# Patient Record
Sex: Male | Born: 1970 | Race: White | Hispanic: No | Marital: Married | Smoking: Never smoker
Health system: Southern US, Academic
[De-identification: ages and names within clinical notes are randomized; demographics above are authoritative.]

## PROBLEM LIST (undated history)

## (undated) DIAGNOSIS — M7540 Impingement syndrome of unspecified shoulder: Secondary | ICD-10-CM

## (undated) DIAGNOSIS — G4730 Sleep apnea, unspecified: Secondary | ICD-10-CM

## (undated) DIAGNOSIS — R972 Elevated prostate specific antigen [PSA]: Secondary | ICD-10-CM

## (undated) DIAGNOSIS — D696 Thrombocytopenia, unspecified: Secondary | ICD-10-CM

## (undated) DIAGNOSIS — K529 Noninfective gastroenteritis and colitis, unspecified: Secondary | ICD-10-CM

## (undated) DIAGNOSIS — C61 Malignant neoplasm of prostate: Secondary | ICD-10-CM

## (undated) DIAGNOSIS — Z86010 Personal history of colonic polyps: Secondary | ICD-10-CM

## (undated) DIAGNOSIS — E669 Obesity, unspecified: Secondary | ICD-10-CM

## (undated) DIAGNOSIS — M5416 Radiculopathy, lumbar region: Secondary | ICD-10-CM

## (undated) DIAGNOSIS — J310 Chronic rhinitis: Secondary | ICD-10-CM

## (undated) DIAGNOSIS — K5790 Diverticulosis of intestine, part unspecified, without perforation or abscess without bleeding: Secondary | ICD-10-CM

## (undated) DIAGNOSIS — G4733 Obstructive sleep apnea (adult) (pediatric): Secondary | ICD-10-CM

## (undated) DIAGNOSIS — Z9989 Dependence on other enabling machines and devices: Secondary | ICD-10-CM

## (undated) DIAGNOSIS — H409 Unspecified glaucoma: Secondary | ICD-10-CM

## (undated) DIAGNOSIS — E66811 Obesity, class 1: Secondary | ICD-10-CM

## (undated) DIAGNOSIS — E7800 Pure hypercholesterolemia, unspecified: Secondary | ICD-10-CM

## (undated) DIAGNOSIS — K219 Gastro-esophageal reflux disease without esophagitis: Secondary | ICD-10-CM

## (undated) DIAGNOSIS — K580 Irritable bowel syndrome with diarrhea: Secondary | ICD-10-CM

## (undated) DIAGNOSIS — T7840XA Allergy, unspecified, initial encounter: Secondary | ICD-10-CM

## (undated) DIAGNOSIS — M4317 Spondylolisthesis, lumbosacral region: Secondary | ICD-10-CM

## (undated) DIAGNOSIS — K5792 Diverticulitis of intestine, part unspecified, without perforation or abscess without bleeding: Secondary | ICD-10-CM

## (undated) DIAGNOSIS — J349 Unspecified disorder of nose and nasal sinuses: Secondary | ICD-10-CM

## (undated) DIAGNOSIS — Z9109 Other allergy status, other than to drugs and biological substances: Secondary | ICD-10-CM

## (undated) HISTORY — DX: Pure hypercholesterolemia, unspecified: E78.00

## (undated) HISTORY — DX: Elevated prostate specific antigen (PSA): R97.20

## (undated) HISTORY — DX: Malignant neoplasm of prostate: C61

## (undated) HISTORY — DX: Allergy, unspecified, initial encounter: T78.40XA

## (undated) HISTORY — DX: Spondylolisthesis, lumbosacral region: M43.17

## (undated) HISTORY — DX: Personal history of colonic polyps: Z86.010

## (undated) HISTORY — PX: BICEPS TENDON REPAIR: SHX566

## (undated) HISTORY — DX: Irritable bowel syndrome with diarrhea: K58.0

## (undated) HISTORY — PX: SPINE SURGERY: SHX786

## (undated) HISTORY — PX: KNEE ARTHROSCOPY: SUR90

## (undated) HISTORY — PX: ESOPHAGOGASTRODUODENOSCOPY: SHX1529

## (undated) HISTORY — PX: EYE SURGERY: SHX253

## (undated) HISTORY — DX: Radiculopathy, lumbar region: M54.16

## (undated) HISTORY — DX: Obesity, class 1: E66.811

## (undated) HISTORY — DX: Unspecified glaucoma: H40.9

## (undated) HISTORY — PX: SHOULDER ARTHROSCOPY: SHX128

## (undated) HISTORY — DX: Dependence on other enabling machines and devices: Z99.89

## (undated) HISTORY — DX: Noninfective gastroenteritis and colitis, unspecified: K52.9

## (undated) HISTORY — DX: Chronic rhinitis: J31.0

## (undated) HISTORY — DX: Impingement syndrome of unspecified shoulder: M75.40

## (undated) HISTORY — PX: COLONOSCOPY W/ POLYPECTOMY: SHX1380

## (undated) HISTORY — DX: Thrombocytopenia, unspecified: D69.6

## (undated) HISTORY — DX: Obesity, unspecified: E66.9

## (undated) HISTORY — DX: Obstructive sleep apnea (adult) (pediatric): G47.33

## (undated) HISTORY — DX: Diverticulosis of intestine, part unspecified, without perforation or abscess without bleeding: K57.90

## (undated) HISTORY — DX: Sleep apnea, unspecified: G47.30

## (undated) HISTORY — PX: WISDOM TOOTH EXTRACTION: SHX21

## (undated) HISTORY — PX: COLONOSCOPY: WVUENDOPRO10

## (undated) HISTORY — PX: HX SHOULDER ARTHROSCOPY: SHX128

## (undated) HISTORY — PX: HX KNEE ARTHROSCOPY: SHX127

## (undated) HISTORY — DX: Diverticulitis of intestine, part unspecified, without perforation or abscess without bleeding: K57.92

## (undated) HISTORY — DX: Unspecified disorder of nose and nasal sinuses: J34.9

## (undated) HISTORY — DX: Other allergy status, other than to drugs and biological substances: Z91.09

## (undated) HISTORY — PX: HX SEPTOPLASTY: SHX37

## (undated) HISTORY — PX: HX WRIST FRACTURE TX: SHX121

---

## 2007-02-26 DIAGNOSIS — K5790 Diverticulosis of intestine, part unspecified, without perforation or abscess without bleeding: Secondary | ICD-10-CM

## 2007-02-26 HISTORY — DX: Diverticulosis of intestine, part unspecified, without perforation or abscess without bleeding: K57.90

## 2008-02-26 HISTORY — PX: WRIST FRACTURE SURGERY: SHX121

## 2010-11-26 ENCOUNTER — Encounter: Payer: Self-pay | Admitting: Family Medicine

## 2010-11-26 ENCOUNTER — Ambulatory Visit (INDEPENDENT_AMBULATORY_CARE_PROVIDER_SITE_OTHER): Payer: BC Managed Care – PPO | Admitting: Family Medicine

## 2010-11-26 VITALS — BP 96/70 | HR 73 | Temp 98.0°F | Wt 228.0 lb

## 2010-11-26 DIAGNOSIS — J209 Acute bronchitis, unspecified: Secondary | ICD-10-CM

## 2010-11-26 DIAGNOSIS — Z23 Encounter for immunization: Secondary | ICD-10-CM

## 2010-11-26 MED ORDER — FEXOFENADINE HCL 180 MG PO TABS: 180.0000 mg | ORAL_TABLET | Freq: Every day | ORAL | Status: DC

## 2010-11-26 MED ORDER — HYDROCODONE-HOMATROPINE 5-1.5 MG/5ML PO SYRP: ORAL_SOLUTION | ORAL | Status: AC

## 2010-11-26 NOTE — Assessment & Plan Note (Signed)
Viral etiology suspected.  No sign of RAD. I think he is past the worst of this. Discussed symptomatic care with allegra 180mg  qd, add hycodan 1-2 tsp q6h prn, #153ml, no RF. Discussed expected course, reviewed signs/symptoms to call or return for. Flu vaccine IM given today.

## 2010-11-26 NOTE — Progress Notes (Signed)
Office Note 11/26/2010  CC:  Chief Complaint  Patient presents with  . URI    began 11/21/10-felt achey, chills.  Has ST, HA, cough    HPI:  Roy [name] is a 40 y.o. White male who is here to establish care and discuss cough. Patient's most recent primary MD: none Old records were not reviewed prior to or during today's visit.  Onset 5d/a:fever/chills, cough, described as hacky/dry, constant tickle in back of throat, feels mild PND.  No nasal congestion, sneezing, or facial pain.  Body aches initially.  No fever or body aches in the last 2-3d.  Some ST only when coughing.  Denies chest tightness, wheezing, or SOB.  Past Medical History  Diagnosis Date  . Diverticular disease 2009    Colonoscopy confirmed approx 2009 (diverticulitis x 2 episodes).  Hyperlipidemia per pt report: has done TLC only--no meds.  Past Surgical History  Procedure Date  . Wrist fracture surgery 2010    Hockey injury, plate inserted  . Knee arthroscopy     Bilat; 2003, 2004, 2010  . Shoulder arthroscopy     Bilat; 1993, 2000, 2007    Family History  Problem Relation Age of Onset  . Cancer Maternal Grandmother     pancreatic cancer  . Cancer Paternal Grandmother     colon  . Cancer Paternal Grandfather     lung (smoker)    History   Social History  . Marital Status: Married    Spouse Name: N/A    Number of Children: N/A  . Years of Education: N/A   Occupational History  . Not on file.   Social History Main Topics  . Smoking status: Never Smoker   . Smokeless tobacco: Never Used  . Alcohol Use: Not on file  . Drug Use: Not on file  . Sexually Active: Not on file   Other Topics Concern  . 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   CURRENT MEDS: advil q6h prn, delsym prn. The meds listed below were added today. Outpatient Encounter Prescriptions as of 11/26/2010  Medication Sig Dispense Refill  . fexofenadine (ALLEGRA) 180 MG tablet Take 1 tablet (180 mg total) by mouth daily.  15 tablet  1  . HYDROcodone-homatropine (HYCODAN) 5-1.5 MG/5ML syrup 1-2 tsp po q6h prn cough  120 mL  0  . ibuprofen (ADVIL,MOTRIN) 200 MG tablet Take 200 mg by mouth every 6 (six) hours as needed.        . Multiple Vitamin (MULTIVITAMIN) tablet Take 1 tablet by mouth daily.          Allergies  Allergen Reactions  . Dayquil Multi-Symptom Cold-Flu (Eql Daytime Cold-Flu) Hives    ROS Review of Systems  Constitutional: Negative for fever, chills, appetite change and fatigue.  HENT: Positive for postnasal drip. Negative for ear pain, congestion, sore throat (only with cough), neck stiffness and dental problem.   Eyes: Negative for discharge, redness and visual disturbance.  Respiratory: Positive for cough. Negative for chest tightness, shortness of breath and wheezing.   Cardiovascular: Negative for chest pain, palpitations and leg swelling.  Gastrointestinal: Negative for nausea, vomiting, abdominal pain, diarrhea and blood in stool.  Genitourinary: Negative for dysuria, urgency, frequency, hematuria, flank pain and difficulty  urinating.  Musculoskeletal: Negative for myalgias, back pain, joint swelling and arthralgias.  Skin: Negative for pallor and rash.  Neurological: Negative for dizziness, speech difficulty, weakness and headaches.  Hematological: Negative for adenopathy. Does not bruise/bleed easily.  Psychiatric/Behavioral: Negative for confusion and sleep disturbance. The patient is not nervous/anxious.      PE; Blood pressure 96/70, pulse 73, temperature 98 F (36.7 C), temperature source Oral, weight 228 lb (103.42 kg), SpO2 95.00%. Gen: Alert, well  appearing.  Patient is oriented to person, place, time, and situation. HEENT: Scalp without lesions or hair loss.  Ears: EACs clear, normal epithelium.  TMs with good light reflex and landmarks bilaterally.  Eyes: no injection, icteris, swelling, or exudate.  EOMI, PERRLA. Nose: no drainage or turbinate edema/swelling.  No injection or focal lesion.  Mouth: lips without lesion/swelling.  Oral mucosa pink and moist.  Dentition intact and without obvious caries or gingival swelling.  Oropharynx without erythema, exudate, or swelling.  Neck: supple, ROM full.  Carotids 2+ bilat, without bruit.  No lymphadenopathy, thyromegaly, or mass. Chest: symmetric expansion, nonlabored respirations.  Clear and equal breath sounds in all lung fields.   CV: RRR, no m/r/g.  Peripheral pulses 2+ and symmetric. ABD: soft, ND/NT. EXT: no clubbing, cyanosis, or edema.   Pertinent labs:  None today  ASSESSMENT AND PLAN:   New patient:  Acute bronchitis Viral etiology suspected.  No sign of RAD. I think he is past the worst of this. Discussed symptomatic care with allegra 180mg  qd, add hycodan 1-2 tsp q6h prn, #134ml, no RF. Discussed expected course, reviewed signs/symptoms to call or return for. Flu vaccine IM given today.     Return if symptoms worsen or fail to improve.

## 2010-12-13 ENCOUNTER — Encounter: Payer: Self-pay | Admitting: Family Medicine

## 2010-12-13 ENCOUNTER — Ambulatory Visit (INDEPENDENT_AMBULATORY_CARE_PROVIDER_SITE_OTHER): Payer: Self-pay | Admitting: Family Medicine

## 2010-12-13 VITALS — BP 118/78 | HR 75 | Temp 98.1°F | Wt 225.0 lb

## 2010-12-13 DIAGNOSIS — J209 Acute bronchitis, unspecified: Secondary | ICD-10-CM

## 2010-12-13 DIAGNOSIS — T148XXA Other injury of unspecified body region, initial encounter: Secondary | ICD-10-CM | POA: Insufficient documentation

## 2010-12-13 MED ORDER — PREDNISONE 20 MG PO TABS: ORAL_TABLET | ORAL | Status: DC

## 2010-12-13 MED ORDER — CYCLOBENZAPRINE HCL 5 MG PO TABS: 5.0000 mg | ORAL_TABLET | Freq: Three times a day (TID) | ORAL | Status: AC | PRN

## 2010-12-13 MED ORDER — AZITHROMYCIN 250 MG PO TABS: ORAL_TABLET | ORAL | Status: AC
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
## 2010-12-13 NOTE — Assessment & Plan Note (Signed)
Prolonged symptoms. Will start azithromycin x 5d and prednisone 40mg  qd x 5d. Recommended mucinex DM prn for symptoms control.
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
## 2010-12-13 NOTE — Patient Instructions (Signed)
mucinex dm or robitussin dm as needed for cough

## 2011-01-30 ENCOUNTER — Ambulatory Visit (INDEPENDENT_AMBULATORY_CARE_PROVIDER_SITE_OTHER): Payer: BC Managed Care – PPO | Admitting: Family Medicine

## 2011-01-30 ENCOUNTER — Encounter: Payer: Self-pay | Admitting: Family Medicine

## 2011-01-30 ENCOUNTER — Other Ambulatory Visit: Payer: Self-pay | Admitting: *Deleted

## 2011-01-30 VITALS — BP 118/81 | HR 76 | Temp 97.5°F | Wt 234.0 lb

## 2011-01-30 DIAGNOSIS — S0100XA Unspecified open wound of scalp, initial encounter: Secondary | ICD-10-CM

## 2011-01-30 DIAGNOSIS — S0101XA Laceration without foreign body of scalp, initial encounter: Secondary | ICD-10-CM

## 2011-01-30 DIAGNOSIS — Z23 Encounter for immunization: Secondary | ICD-10-CM
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
## 2011-01-30 NOTE — Progress Notes (Signed)
Addended by: Francee Piccolo C on: 01/30/2011 11:28 AM   Modules accepted: Orders

## 2011-01-30 NOTE — Patient Instructions (Signed)
Gently wash hair/clean scalp lac once daily with your normal shampoo. Dab dry with a towel.

## 2011-02-04 ENCOUNTER — Encounter: Payer: Self-pay | Admitting: Family Medicine

## 2011-02-04 ENCOUNTER — Ambulatory Visit (INDEPENDENT_AMBULATORY_CARE_PROVIDER_SITE_OTHER): Payer: BC Managed Care – PPO | Admitting: Family Medicine

## 2011-02-04 VITALS — BP 114/79 | HR 76 | Temp 97.8°F | Ht 73.0 in | Wt 234.1 lb

## 2011-02-04 DIAGNOSIS — S0101XA Laceration without foreign body of scalp, initial encounter: Secondary | ICD-10-CM

## 2011-02-04 DIAGNOSIS — S0100XA Unspecified open wound of scalp, initial encounter: Secondary | ICD-10-CM

## 2011-02-04 NOTE — Progress Notes (Signed)
OFFICE NOTE  02/04/2011  CC:  Chief Complaint  Patient presents with  . stich removal     HPI: Patient is a 40 y.o. Caucasian male who is here for suture removal from scalp that I put in 5d/a. Feeling fine.  NO drainage, no pain, no fevers.  Pertinent PMH:  NONE Pertinent Meds: NONE PE: Blood pressure 114/79, pulse 76, temperature 97.8 F (36.6 C), temperature source Oral, height 6\' 1"  (1.854 m), weight 234 lb 1.9 oz (106.196 kg), SpO2 96.00%. Gen: Alert, well appearing.  Patient is oriented to person, place, time, and situation. Scalp: Right parietal scalp area with 1 cm lac with wound edges well approximated.  No erythema, drainage, or tenderness. Suture in place.  IMPRESSION AND PLAN: Small calp lac--looks good.  Removed suture today w/out problem. Wound care instructions reviewed.  FOLLOW UP: prn

## 2011-12-14 ENCOUNTER — Other Ambulatory Visit: Payer: Self-pay | Admitting: Family Medicine

## 2011-12-14 MED ORDER — METRONIDAZOLE 500 MG PO TABS: ORAL_TABLET | ORAL | Status: DC

## 2011-12-14 MED ORDER — CIPROFLOXACIN HCL 750 MG PO TABS: ORAL_TABLET | ORAL | Status: DC

## 2011-12-14 NOTE — Progress Notes (Signed)
Saw pt in community.  He said he wasn't feeling well, has had lower abd aching diffusely last couple of days, getting a little worse, says it feels like past episodes of diverticulitis he has had.  I advised him to call if he felt like it wasn't getting better.  He called me, said it wasn't improving, so I eRx'd metronidazole and flagyl for presumed diverticulitis.  I did advise him to call me back or come in for office visit if his abd pain is worsening or he has new/more worrisome symptoms.  Will call pt tomorrow and see how he's doing.

## 2012-02-26 DIAGNOSIS — D696 Thrombocytopenia, unspecified: Secondary | ICD-10-CM

## 2012-02-26 HISTORY — DX: Thrombocytopenia, unspecified: D69.6

## 2012-07-28 ENCOUNTER — Other Ambulatory Visit (INDEPENDENT_AMBULATORY_CARE_PROVIDER_SITE_OTHER): Payer: BC Managed Care – PPO

## 2012-07-28 DIAGNOSIS — Z0000 Encounter for general adult medical examination without abnormal findings: Secondary | ICD-10-CM

## 2012-07-28 DIAGNOSIS — K589 Irritable bowel syndrome without diarrhea: Secondary | ICD-10-CM

## 2012-07-28 DIAGNOSIS — R1013 Epigastric pain: Secondary | ICD-10-CM

## 2012-07-28 LAB — CBC WITH DIFFERENTIAL/PLATELET
Basophils Absolute: 0 10*3/uL (ref 0.0–0.1)
Eosinophils Absolute: 0.3 10*3/uL (ref 0.0–0.7)
HCT: 47.6 % (ref 39.0–52.0)
Hemoglobin: 15.8 g/dL (ref 13.0–17.0)
Lymphocytes Relative: 28.7 % (ref 12.0–46.0)
Lymphs Abs: 1.5 10*3/uL (ref 0.7–4.0)
MCHC: 33.2 g/dL (ref 30.0–36.0)
MCV: 97.7 fl (ref 78.0–100.0)
Monocytes Absolute: 0.5 10*3/uL (ref 0.1–1.0)
Neutro Abs: 2.9 10*3/uL (ref 1.4–7.7)
RDW: 13.2 % (ref 11.5–14.6)

## 2012-07-28 LAB — COMPREHENSIVE METABOLIC PANEL
ALT: 15 U/L (ref 0–53)
AST: 20 U/L (ref 0–37)
Alkaline Phosphatase: 72 U/L (ref 39–117)
Creatinine, Ser: 1.1 mg/dL (ref 0.4–1.5)
Total Bilirubin: 1.1 mg/dL (ref 0.3–1.2)

## 2012-07-28 LAB — LIPID PANEL
Cholesterol: 216 mg/dL — ABNORMAL HIGH (ref 0–200)
Total CHOL/HDL Ratio: 6
Triglycerides: 169 mg/dL — ABNORMAL HIGH (ref 0.0–149.0)

## 2012-07-28 NOTE — Addendum Note (Signed)
Addended by: Baldemar Lenis R on: 07/28/2012 11:00 AM   Modules accepted: Orders

## 2012-07-28 NOTE — Progress Notes (Signed)
Labs only

## 2012-07-30 ENCOUNTER — Encounter: Payer: Self-pay | Admitting: Family Medicine

## 2012-07-30 ENCOUNTER — Ambulatory Visit: Payer: BC Managed Care – PPO

## 2012-07-30 ENCOUNTER — Ambulatory Visit (INDEPENDENT_AMBULATORY_CARE_PROVIDER_SITE_OTHER): Payer: BC Managed Care – PPO | Admitting: Family Medicine

## 2012-07-30 VITALS — BP 90/60 | HR 73 | Temp 97.6°F | Ht 73.0 in | Wt 239.8 lb

## 2012-07-30 DIAGNOSIS — K589 Irritable bowel syndrome without diarrhea: Secondary | ICD-10-CM

## 2012-07-30 DIAGNOSIS — D239 Other benign neoplasm of skin, unspecified: Secondary | ICD-10-CM

## 2012-07-30 DIAGNOSIS — Z0000 Encounter for general adult medical examination without abnormal findings: Secondary | ICD-10-CM

## 2012-07-30 DIAGNOSIS — R142 Eructation: Secondary | ICD-10-CM

## 2012-07-30 DIAGNOSIS — R143 Flatulence: Secondary | ICD-10-CM

## 2012-07-30 DIAGNOSIS — R1013 Epigastric pain: Secondary | ICD-10-CM

## 2012-07-30 NOTE — Progress Notes (Signed)
Office Note 08/09/2012  CC:  Chief Complaint  Patient presents with  . Annual Exam    HPI:  Roy [name] is a 42 y.o. White male who is here for CPE.    Not exercising but plans on starting soon.   Reports about 6 mo hx of excessive burping.  He does also have some classic heartburn but much less often than the gas/burping.  Sometimes occurs in postprandial timeframe but also sometimes seems to occur unrelated to eating anything. No NSAIDs with any regularity.  Tums have not helped, prilosec on occasion has not helped.  He has not tried gas-ex yet.  Denies constipation, melena, or hematochezia.  He does occasionally have postprandial fecal urgency and on some days has excessive, slightly loose BMs.   No abdominal cramping.  Appetite is good.   Past Medical History  Diagnosis Date  . Diverticular disease 2009    Colonoscopy confirmed approx 2009 (diverticulitis x 2 episodes).    Past Surgical History  Procedure Laterality Date  . Wrist fracture surgery  2010    Hockey injury, plate inserted  . Knee arthroscopy      Bilat; 2003, 2004, 2010  . Shoulder arthroscopy      Bilat; 1993, 2000, 2007    Family History  Problem Relation Age of Onset  . Cancer Maternal Grandmother     pancreatic cancer  . Cancer Paternal Grandmother     colon  . Cancer Paternal Grandfather     lung (smoker)    History   Social History  . Marital Status: Married    Spouse Name: N/A    Number of Children: N/A  . Years of Education: N/A   Occupational History  . Not on file.   Social History Main Topics  . Smoking status: Never Smoker   . Smokeless tobacco: Never Used  . Alcohol Use: Not on file  . Drug Use: Not on file  . Sexually Active: Not on file   Other Topics Concern  . Not on file   Social History Narrative   Married, 3 children from ages 20yrs to 44 yrs.   Occupation: Doctor, hospital for [employer].   Grad from [employer] in [place], [place].    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   Exercises irregularly, no particular dietary focus.   No T/A/Ds.   MEDS: none  Allergies  Allergen Reactions  . Dayquil Multi-Symptom Cold-Flu (Pseudoephedrine-Apap-Dm) Hives    ROS Review of Systems  Constitutional: Negative for fever, chills, appetite change and fatigue.  HENT: Negative for ear pain, congestion, sore throat, neck stiffness and dental problem.   Eyes: Negative for discharge, redness and visual disturbance.  Respiratory: Negative for cough, chest tightness, shortness of breath and wheezing.   Cardiovascular: Negative for chest pain, palpitations and leg swelling.  Gastrointestinal: Negative for nausea, vomiting and blood in stool.       See HPI  Genitourinary: Negative for dysuria, urgency, frequency, hematuria, flank pain and difficulty urinating.  Musculoskeletal: Negative for myalgias, back pain, joint swelling and arthralgias.  Skin: Negative for pallor and rash.  Neurological: Negative for dizziness, speech difficulty, weakness and headaches.  Hematological: Negative for adenopathy. Does not bruise/bleed easily.  Psychiatric/Behavioral: Negative for confusion and sleep disturbance. The patient is not nervous/anxious.     PE; Blood pressure 90/60, pulse 73, temperature 97.6 F (36.4 C), temperature source Oral, height 6\' 1"  (1.854 m), weight 239 lb  12 oz (108.75 kg), SpO2 97.00%. Gen: Alert, well appearing.  Patient is oriented to person, place, time, and situation. AFFECT: pleasant, lucid thought and speech. ENT: Ears: EACs clear, normal epithelium.  TMs with good light reflex and landmarks bilaterally.  Eyes: no injection, icteris, swelling, or exudate.  EOMI, PERRLA. Nose: no drainage or turbinate edema/swelling.  No injection or focal lesion.  Mouth: lips without lesion/swelling.  Oral mucosa pink and moist.  Dentition intact and without obvious  caries or gingival swelling.  Oropharynx without erythema, exudate, or swelling.  Neck: supple/nontender.  No LAD, mass, or TM.  Carotid pulses 2+ bilaterally, without bruits. CV: RRR, no m/r/g.   LUNGS: CTA bilat, nonlabored resps, good aeration in all lung fields. ABD: soft, NT, ND, BS normal.  No hepatospenomegaly or mass.  No bruits. EXT: no clubbing, cyanosis, or edema.  Musculoskeletal: no joint swelling, erythema, warmth, or tenderness.  ROM of all joints intact. Skin - no sores or suspicious lesions or rashes or color changes.  He has a 1 cm brownish oval macule with slightly firm surface on left thigh--pt says it has been there as long as he can remember and hasn't changed.   Pertinent labs:  Lab Results  Component Value Date   TSH 1.87 07/28/2012   Lab Results  Component Value Date   WBC 5.3 07/28/2012   HGB 15.8 07/28/2012   HCT 47.6 07/28/2012   MCV 97.7 07/28/2012   PLT 134.0* 07/28/2012   Lab Results  Component Value Date   CREATININE 1.1 07/28/2012   BUN 14 07/28/2012   NA 139 07/28/2012   K 4.1 07/28/2012   CL 104 07/28/2012   CO2 31 07/28/2012   Lab Results  Component Value Date   ALT 15 07/28/2012   AST 20 07/28/2012   ALKPHOS 72 07/28/2012   BILITOT 1.1 07/28/2012   Lab Results  Component Value Date   CHOL 216* 07/28/2012   Lab Results  Component Value Date   HDL 33.90* 07/28/2012   No results found for this basename: LDLCALC   Lab Results  Component Value Date   TRIG 169.0* 07/28/2012   Lab Results  Component Value Date   CHOLHDL 6 07/28/2012   No results found for this basename: PSA       ASSESSMENT AND PLAN:   Health maintenance examination Reviewed age and gender appropriate health maintenance issues (prudent diet, regular exercise, health risks of tobacco and excessive alcohol, use of seatbelts, fire alarms in home, use of sunscreen).  Also reviewed age and gender appropriate health screening as well as vaccine recommendations. Labs reviewed with pt in detail  today--platelets just a touch low.  Will recheck CBC in 1 mo to make sure there is no trend downward. No immunizations needed.  Excessive gas Dyspepsia vs atypical GERD vs just dietary effect. Suspect mild IBS as well.    Decided to try gas-ex for a few days.  If not convincingly better then d/c gas-ex and do 3 wk trial of dexilant 60mg  qd (samples given today). Celiac panel and H pylori IgG today.  Signs/symptoms to call or return for were reviewed and pt expressed understanding. GI referral in future if he gets more worrisome sx's or we simply can't find an answer or solution for his problem.  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 Reassured.    FOLLOW UP:  Return in about 1 year (around 07/30/2013) for CPE.  BUT: return for lab visit for recheck CBC in 1 month.

## 2012-07-30 NOTE — Addendum Note (Signed)
Addended by: Baldemar Lenis R on: 07/30/2012 10:09 AM   Modules accepted: Orders

## 2012-07-30 NOTE — Patient Instructions (Signed)
Health Maintenance, Males A healthy lifestyle and preventative care can promote health and wellness.  Maintain regular health, dental, and eye exams.  Eat a healthy diet. Foods like vegetables, fruits, whole grains, low-fat dairy products, and lean protein foods contain the nutrients you need without too many calories. Decrease your intake of foods high in solid fats, added sugars, and salt. Get information about a proper diet from your caregiver, if necessary.  Regular physical exercise is one of the most important things you can do for your health. Most adults should get at least 150 minutes of moderate-intensity exercise (any activity that increases your heart rate and causes you to sweat) each week. In addition, most adults need muscle-strengthening exercises on 2 or more days a week.   Maintain a healthy weight. The body mass index (BMI) is a screening tool to identify possible weight problems. It provides an estimate of body fat based on height and weight. Your caregiver can help determine your BMI, and can help you achieve or maintain a healthy weight. For adults 20 years and older:  A BMI below 18.5 is considered underweight.  A BMI of 18.5 to 24.9 is normal.  A BMI of 25 to 29.9 is considered overweight.  A BMI of 30 and above is considered obese.  Maintain normal blood lipids and cholesterol by exercising and minimizing your intake of saturated fat. Eat a balanced diet with plenty of fruits and vegetables. Blood tests for lipids and cholesterol should begin at age 20 and be repeated every 5 years. If your lipid or cholesterol levels are high, you are over 50, or you are a high risk for heart disease, you may need your cholesterol levels checked more frequently.Ongoing high lipid and cholesterol levels should be treated with medicines, if diet and exercise are not effective.  If you smoke, find out from your caregiver how to quit. If you do not use tobacco, do not start.  If you  choose to drink alcohol, do not exceed 2 drinks per day. One drink is considered to be 12 ounces (355 mL) of beer, 5 ounces (148 mL) of wine, or 1.5 ounces (44 mL) of liquor.  Avoid use of street drugs. Do not share needles with anyone. Ask for help if you need support or instructions about stopping the use of drugs.  High blood pressure causes heart disease and increases the risk of stroke. Blood pressure should be checked at least every 1 to 2 years. Ongoing high blood pressure should be treated with medicines if weight loss and exercise are not effective.  If you are 45 to 42 years old, ask your caregiver if you should take aspirin to prevent heart disease.  Diabetes screening involves taking a blood sample to check your fasting blood sugar level. This should be done once every 3 years, after age 45, if you are within normal weight and without risk factors for diabetes. Testing should be considered at a younger age or be carried out more frequently if you are overweight and have at least 1 risk factor for diabetes.  Colorectal cancer can be detected and often prevented. Most routine colorectal cancer screening begins at the age of 50 and continues through age 75. However, your caregiver may recommend screening at an earlier age if you have risk factors for colon cancer. On a yearly basis, your caregiver may provide home test kits to check for hidden blood in the stool. Use of a small camera at the end of a tube,   to directly examine the colon (sigmoidoscopy or colonoscopy), can detect the earliest forms of colorectal cancer. Talk to your caregiver about this at age 50, when routine screening begins. Direct examination of the colon should be repeated every 5 to 10 years through age 75, unless early forms of pre-cancerous polyps or small growths are found.  Hepatitis C blood testing is recommended for all people born from 1945 through 1965 and any individual with known risks for hepatitis C.  Healthy  men should no longer receive prostate-specific antigen (PSA) blood tests as part of routine cancer screening. Consult with your caregiver about prostate cancer screening.  Testicular cancer screening is not recommended for adolescents or adult males who have no symptoms. Screening includes self-exam, caregiver exam, and other screening tests. Consult with your caregiver about any symptoms you have or any concerns you have about testicular cancer.  Practice safe sex. Use condoms and avoid high-risk sexual practices to reduce the spread of sexually transmitted infections (STIs).  Use sunscreen with a sun protection factor (SPF) of 30 or greater. Apply sunscreen liberally and repeatedly throughout the day. You should seek shade when your shadow is shorter than you. Protect yourself by wearing long sleeves, pants, a wide-brimmed hat, and sunglasses year round, whenever you are outdoors.  Notify your caregiver of new moles or changes in moles, especially if there is a change in shape or color. Also notify your caregiver if a mole is larger than the size of a pencil eraser.  A one-time screening for abdominal aortic aneurysm (AAA) and surgical repair of large AAAs by sound wave imaging (ultrasonography) is recommended for ages 65 to 75 years who are current or former smokers.  Stay current with your immunizations. Document Released: 08/10/2007 Document Revised: 05/06/2011 Document Reviewed: 07/09/2010 [employer] Patient Information 2014 [employer].  

## 2012-07-31 LAB — TISSUE TRANSGLUTAMINASE, IGA: Tissue Transglutaminase Ab, IgA: 17 U/mL (ref <20)

## 2012-08-09 DIAGNOSIS — R143 Flatulence: Secondary | ICD-10-CM | POA: Insufficient documentation

## 2012-08-09 DIAGNOSIS — Z0000 Encounter for general adult medical examination without abnormal findings: Secondary | ICD-10-CM | POA: Insufficient documentation

## 2012-08-09 DIAGNOSIS — D239 Other benign neoplasm of skin, unspecified: Secondary | ICD-10-CM | POA: Insufficient documentation

## 2012-08-09 NOTE — Assessment & Plan Note (Signed)
Dyspepsia vs atypical GERD vs just dietary effect. Suspect mild IBS as well.    Decided to try gas-ex for a few days.  If not convincingly better then d/c gas-ex and do 3 wk trial of dexilant 60mg  qd (samples given today). Celiac panel and H pylori IgG today.  Signs/symptoms to call or return for were reviewed and pt expressed understanding. GI referral in future if he gets more worrisome sx's or we simply can't find an answer or solution for his problem.

## 2012-08-09 NOTE — Assessment & Plan Note (Addendum)
Reviewed age and gender appropriate health maintenance issues (prudent diet, regular exercise, health risks of tobacco and excessive alcohol, use of seatbelts, fire alarms in home, use of sunscreen).  Also reviewed age and gender appropriate health screening as well as vaccine recommendations. Labs reviewed with pt in detail today--platelets just a touch low.  Will recheck CBC in 1 mo to make sure there is no trend downward. No immunizations needed.

## 2012-08-09 NOTE — Assessment & Plan Note (Signed)
Lesion unchanged in years per pt. Reassured.

## 2012-08-27 ENCOUNTER — Other Ambulatory Visit (INDEPENDENT_AMBULATORY_CARE_PROVIDER_SITE_OTHER): Payer: BC Managed Care – PPO

## 2012-08-27 DIAGNOSIS — K589 Irritable bowel syndrome without diarrhea: Secondary | ICD-10-CM

## 2012-08-27 DIAGNOSIS — D696 Thrombocytopenia, unspecified: Secondary | ICD-10-CM

## 2012-08-27 LAB — CBC WITH DIFFERENTIAL/PLATELET
Basophils Absolute: 0 10*3/uL (ref 0.0–0.1)
Basophils Relative: 0.5 % (ref 0.0–3.0)
Eosinophils Absolute: 0.1 10*3/uL (ref 0.0–0.7)
HCT: 45.6 % (ref 39.0–52.0)
Hemoglobin: 15.4 g/dL (ref 13.0–17.0)
Lymphocytes Relative: 23.8 % (ref 12.0–46.0)
Lymphs Abs: 0.9 10*3/uL (ref 0.7–4.0)
MCHC: 33.7 g/dL (ref 30.0–36.0)
MCV: 97.7 fl (ref 78.0–100.0)
Neutro Abs: 2.3 10*3/uL (ref 1.4–7.7)
RBC: 4.67 Mil/uL (ref 4.22–5.81)
RDW: 13 % (ref 11.5–14.6)

## 2012-08-27 NOTE — Progress Notes (Signed)
Labs only

## 2012-09-16 ENCOUNTER — Encounter: Payer: Self-pay | Admitting: Family Medicine

## 2012-09-16 ENCOUNTER — Ambulatory Visit (INDEPENDENT_AMBULATORY_CARE_PROVIDER_SITE_OTHER): Payer: BC Managed Care – PPO | Admitting: Family Medicine

## 2012-09-16 VITALS — BP 118/76 | HR 69 | Temp 97.8°F | Resp 16 | Ht 73.0 in | Wt 230.0 lb

## 2012-09-16 DIAGNOSIS — M1310 Monoarthritis, not elsewhere classified, unspecified site: Secondary | ICD-10-CM

## 2012-09-16 NOTE — Progress Notes (Signed)
OFFICE NOTE  09/16/2012  CC:  Chief Complaint  Patient presents with  . Foot Pain    L foot x Sunday  . Abdominal Pain    LLQ since yesterday     HPI: Patient is a 42 y.o. Caucasian male who is here for left foot/toe pain and some LLQ pain onset yesterday. Left foot plantar aspect of great toe between 1st MTP joint and IP jt of first toe started hurting 4 d/a, lasted about 1 day and seemed to completely dissipate. No significant swelling or redness.  Last 2 d he has done lots of wt bearing and it has still felt improved despite this.  Left lower abd, L>R feeling sore last 12h, improving since last night.  No blood in stool, no diarrhea.    Pertinent PMH:  Past Medical History  Diagnosis Date  . Diverticular disease 2009    Colonoscopy confirmed approx 2009 (diverticulitis x 2 episodes).   Past Surgical History  Procedure Laterality Date  . Wrist fracture surgery  2010    Hockey injury, plate inserted  . Knee arthroscopy      Bilat; 2003, 2004, 2010  . Shoulder arthroscopy      Bilat; 1993, 2000, 2007    MEDS:  NONE currently  PE: Blood pressure 118/76, pulse 69, temperature 97.8 F (36.6 C), temperature source Temporal, resp. rate 16, height 6\' 1"  (1.854 m), weight 230 lb (104.327 kg), SpO2 96.00%. Gen: Alert, well appearing.  Patient is oriented to person, place, time, and situation. ABD: soft, NT, ND, BS normal.  No hepatospenomegaly or mass.  No bruits. EXT: Left foot without any erythema, tenderness, or swelling.  ROM of all toes intact.  IMPRESSION AND PLAN:  1) Left great toe pain, suspect tendon strain--doubtful gout given it's mild nature and very short duration. Check uric acid level today.  2) Abd wall muscle pain  Reassured pt.    FOLLOW UP: prn (6 wks for lab visit to recheck abnl CBC)

## 2012-10-28 ENCOUNTER — Other Ambulatory Visit: Payer: BC Managed Care – PPO

## 2012-10-29 ENCOUNTER — Other Ambulatory Visit (INDEPENDENT_AMBULATORY_CARE_PROVIDER_SITE_OTHER): Payer: BC Managed Care – PPO

## 2012-10-29 DIAGNOSIS — R7989 Other specified abnormal findings of blood chemistry: Secondary | ICD-10-CM

## 2012-10-29 LAB — CBC WITH DIFFERENTIAL/PLATELET
Basophils Relative: 0.7 % (ref 0.0–3.0)
Eosinophils Absolute: 0.1 10*3/uL (ref 0.0–0.7)
Eosinophils Relative: 3.2 % (ref 0.0–5.0)
HCT: 45.6 % (ref 39.0–52.0)
Lymphs Abs: 0.9 10*3/uL (ref 0.7–4.0)
MCHC: 34 g/dL (ref 30.0–36.0)
MCV: 94.3 fl (ref 78.0–100.0)
Monocytes Absolute: 0.4 10*3/uL (ref 0.1–1.0)
Platelets: 132 10*3/uL — ABNORMAL LOW (ref 150.0–400.0)
WBC: 4.2 10*3/uL — ABNORMAL LOW (ref 4.5–10.5)

## 2012-10-29 NOTE — Progress Notes (Signed)
Labs only

## 2012-11-03 ENCOUNTER — Telehealth: Payer: Self-pay | Admitting: Family Medicine

## 2012-11-03 DIAGNOSIS — D696 Thrombocytopenia, unspecified: Secondary | ICD-10-CM

## 2012-11-03 DIAGNOSIS — D72819 Decreased white blood cell count, unspecified: Secondary | ICD-10-CM

## 2012-11-03 NOTE — Telephone Encounter (Signed)
Discussed lab results with pt: WBC and plt's still mildly low.  Recommend pt return for lab visit for CBC w/diff and pathologist review of periph blood smear.

## 2012-11-03 NOTE — Telephone Encounter (Signed)
I discussed pt's case with Dr. Myna Hidalgo on the phone today and he recommended no periph smear at this time and no hematol referral at this time. He recommended repeat CBC with diff in 3-4 months: if stable then repeat in 6 mo. If worse WBC (particularly if differential is abnl), or if platelets are decreasing then proceed with smear and hem referral. Discussed this plan with pt today and he is in agreement.  He'll call for lab appt in Dec 2014 or Jan 2015.

## 2012-11-05 ENCOUNTER — Other Ambulatory Visit: Payer: BC Managed Care – PPO

## 2012-12-31 ENCOUNTER — Other Ambulatory Visit: Payer: Self-pay

## 2013-07-11 ENCOUNTER — Other Ambulatory Visit: Payer: Self-pay | Admitting: Family Medicine

## 2013-07-11 DIAGNOSIS — M79631 Pain in right forearm: Secondary | ICD-10-CM

## 2013-07-23 ENCOUNTER — Other Ambulatory Visit (INDEPENDENT_AMBULATORY_CARE_PROVIDER_SITE_OTHER): Payer: BC Managed Care – PPO

## 2013-07-23 ENCOUNTER — Ambulatory Visit (INDEPENDENT_AMBULATORY_CARE_PROVIDER_SITE_OTHER): Payer: BC Managed Care – PPO | Admitting: Family Medicine

## 2013-07-23 VITALS — BP 130/94 | HR 85 | Ht 73.0 in | Wt 239.0 lb

## 2013-07-23 DIAGNOSIS — M7711 Lateral epicondylitis, right elbow: Secondary | ICD-10-CM | POA: Insufficient documentation

## 2013-07-23 DIAGNOSIS — M25521 Pain in right elbow: Secondary | ICD-10-CM

## 2013-07-23 DIAGNOSIS — M25529 Pain in unspecified elbow: Secondary | ICD-10-CM

## 2013-07-23 DIAGNOSIS — M7710 Lateral epicondylitis, unspecified elbow: Secondary | ICD-10-CM

## 2013-07-23 MED ORDER — NITROGLYCERIN 0.2 MG/HR TD PT24: MEDICATED_PATCH | TRANSDERMAL | Status: DC

## 2013-07-23 MED ORDER — DOXYCYCLINE HYCLATE 100 MG PO TABS: 100.0000 mg | ORAL_TABLET | Freq: Two times a day (BID) | ORAL | Status: AC

## 2013-07-23 MED ORDER — MELOXICAM 15 MG PO TABS: 15.0000 mg | ORAL_TABLET | Freq: Every day | ORAL | Status: DC

## 2013-07-23 NOTE — Assessment & Plan Note (Signed)
Patient does have lateral epicondylitis of the right elbow. Patient also has what appears to be a tear in the extensor common brevis tendon. Patient though has not had any other modalities at this time. Patient wants to remain active. We'll do anti-inflammatories and we will also do nitroglycerin and we discussed about potential side effects. We discussed icing protocol as well as wearing a brace. Please see patient instructions for further details. Patient will try these interventions and come back again in 3 weeks for further evaluation and treatment.

## 2013-07-23 NOTE — Progress Notes (Signed)
  [name] [address], [place] [address] Phone: [phone] Subjective:    I'm seeing this patient by the request  of:  [name],PHILIP H, MD   CC: Right elbow pain  VQM:[id number] Council Munguia is a 43 y.o. male coming in with complaint of right elbow pain. Patient states that it has been hurting for a couple months. Patient was doing crosfit on a regular regimen and was having significant pain and even though some mild weakness with wrist extension. Patient for the last 2 weeks has been not as active secondary to traveling and states that the pain did not go away. Patient points to the lateral epicondylar region as where most of his pain is. Describes it as a dull aching sensation that can wake him up at night. Has tried over-the-counter medications without any significant improvement as well as a counter strap. Patient rates the severity a 7/10. Patient would like to become more active again.     Past medical history, social, surgical and family history all reviewed in electronic medical record.   Review of Systems: No headache, visual changes, nausea, vomiting, diarrhea, constipation, dizziness, abdominal pain, skin rash, fevers, chills, night sweats, weight loss, swollen lymph nodes, body aches, joint swelling, muscle aches, chest pain, shortness of breath, mood changes.   Objective Blood pressure 130/94, pulse 85, height 6\' 1"  (1.854 m), weight 239 lb (108.41 kg), SpO2 97.00%.  General: No apparent distress alert and oriented x3 mood and affect normal, dressed appropriately.  HEENT: Pupils equal, extraocular movements intact  Respiratory: Patient's speak in full sentences and does not appear short of breath  Cardiovascular: No lower extremity edema, non tender, no erythema  Skin: Warm dry intact with no signs of infection or rash on extremities or on axial skeleton.  Abdomen: Soft nontender  Neuro: Cranial nerves II through XII are  intact, neurovascularly intact in all extremities with 2+ DTRs and 2+ pulses.  Lymph: No lymphadenopathy of posterior or anterior cervical chain or axillae bilaterally.  Gait normal with good balance and coordination.  MSK:  Non tender with full range of motion and good stability and symmetric strength and tone of shoulders, , wrist, hip, knee and ankles bilaterally.  Elbow: Right Unremarkable to inspection. Range of motion full pronation, supination, flexion, extension. Strength is full to all of the above directions Stable to varus, valgus stress. Negative moving valgus stress test. Tender to palpation over the lateral epicondylar region Ulnar nerve does not sublux. Negative cubital tunnel Tinel's. Contralateral exam unremarkable  Musculoskeletal ultrasound was performed and interpreted by Charlann Boxer D.O.   Elbow:  Lateral epicondyle and common extensor tendon origin visualized. Patient does have significant amount hypoechoic changes and appears to have a tear in approximately 40% of the tendon. There is what appears to be a resolving hematoma versus scar tissue in the vicinity.  Radial head unremarkable and located in annular ligament Medial epicondyle and common flexor tendon origin visualized.  No edema, effusions, or avulsions seen. Ulnar nerve in cubital tunnel unremarkable. Olecranon and triceps insertion visualized and unremarkable without edema, effusion, or avulsion.  No signs olecranon bursitis. Power doppler signal normal.  IMPRESSION:  Lateral epicondylitis with common extensor tendon tear     Impression and Recommendations:     This case required medical decision making of moderate complexity.

## 2013-07-23 NOTE — Patient Instructions (Signed)
Great to see you! Ice 20 minutes 2 times daily Wear brace  Day and night for next 2 weeks then nightly for 1 week.  meloixcam daily for 10 days then as needed Nitroglycerin Protocol   Apply 1/4 nitroglycerin patch to affected area daily.  Change position of patch within the affected area every 24 hours.  You may experience a headache during the first 1-2 weeks of using the patch, these should subside.  If you experience headaches after beginning nitroglycerin patch treatment, you may take your preferred over the counter pain reliever.  Another side effect of the nitroglycerin patch is skin irritation or rash related to patch adhesive.  Please notify our office if you develop more severe headaches or rash, and stop the patch.  Tendon healing with nitroglycerin patch may require 12 to 24 weeks depending on the extent of injury.  Men should not use if taking Viagra, Cialis, or Levitra.   Do not use if you have migraines or rosacea.   Come back in 3 weeks.

## 2013-07-24 ENCOUNTER — Encounter: Payer: Self-pay | Admitting: Family Medicine

## 2013-08-04 ENCOUNTER — Other Ambulatory Visit (INDEPENDENT_AMBULATORY_CARE_PROVIDER_SITE_OTHER): Payer: BC Managed Care – PPO

## 2013-08-04 DIAGNOSIS — Z0000 Encounter for general adult medical examination without abnormal findings: Secondary | ICD-10-CM

## 2013-08-04 LAB — LIPID PANEL
Cholesterol: 200 mg/dL (ref 0–200)
HDL: 40.4 mg/dL (ref >39.00)
LDL CALC: 147 mg/dL — AB (ref 0–99)
NonHDL: 159.6
Total CHOL/HDL Ratio: 5
Triglycerides: 65 mg/dL (ref 0.0–149.0)
VLDL: 13 mg/dL (ref 0.0–40.0)

## 2013-08-04 LAB — CBC WITH DIFFERENTIAL/PLATELET
Basophils Absolute: 0 10*3/uL (ref 0.0–0.1)
Basophils Relative: 0.4 % (ref 0.0–3.0)
EOS PCT: 6.2 % — AB (ref 0.0–5.0)
Eosinophils Absolute: 0.3 10*3/uL (ref 0.0–0.7)
HEMATOCRIT: 46.6 % (ref 39.0–52.0)
Hemoglobin: 15.9 g/dL (ref 13.0–17.0)
LYMPHS ABS: 1.4 10*3/uL (ref 0.7–4.0)
Lymphocytes Relative: 33.1 % (ref 12.0–46.0)
MCHC: 34.2 g/dL (ref 30.0–36.0)
MCV: 95 fl (ref 78.0–100.0)
Monocytes Absolute: 0.4 10*3/uL (ref 0.1–1.0)
Monocytes Relative: 10.3 % (ref 3.0–12.0)
Neutro Abs: 2.1 10*3/uL (ref 1.4–7.7)
Neutrophils Relative %: 50 % (ref 43.0–77.0)
Platelets: 120 10*3/uL — ABNORMAL LOW (ref 150.0–400.0)
RBC: 4.9 Mil/uL (ref 4.22–5.81)
RDW: 13.2 % (ref 11.5–15.5)
WBC: 4.2 10*3/uL (ref 4.0–10.5)

## 2013-08-04 LAB — COMPREHENSIVE METABOLIC PANEL
ALT: 15 U/L (ref 0–53)
AST: 21 U/L (ref 0–37)
Albumin: 4.1 g/dL (ref 3.5–5.2)
Alkaline Phosphatase: 70 U/L (ref 39–117)
BUN: 19 mg/dL (ref 6–23)
CO2: 28 mEq/L (ref 19–32)
Calcium: 9.3 mg/dL (ref 8.4–10.5)
Chloride: 106 mEq/L (ref 96–112)
Creatinine, Ser: 1 mg/dL (ref 0.4–1.5)
GFR: 83 mL/min (ref >60.00)
GLUCOSE: 92 mg/dL (ref 70–99)
Potassium: 4.4 mEq/L (ref 3.5–5.1)
Sodium: 139 mEq/L (ref 135–145)
Total Bilirubin: 0.9 mg/dL (ref 0.2–1.2)
Total Protein: 6.9 g/dL (ref 6.0–8.3)

## 2013-08-04 LAB — TSH: TSH: 1.64 u[IU]/mL (ref 0.35–4.50)

## 2013-08-05 ENCOUNTER — Ambulatory Visit (INDEPENDENT_AMBULATORY_CARE_PROVIDER_SITE_OTHER): Payer: BC Managed Care – PPO | Admitting: Family Medicine

## 2013-08-05 ENCOUNTER — Encounter: Payer: Self-pay | Admitting: Family Medicine

## 2013-08-05 VITALS — BP 114/79 | HR 66 | Temp 98.6°F | Resp 18 | Ht 73.0 in | Wt 239.0 lb

## 2013-08-05 DIAGNOSIS — D693 Immune thrombocytopenic purpura: Secondary | ICD-10-CM

## 2013-08-05 DIAGNOSIS — Z0000 Encounter for general adult medical examination without abnormal findings: Secondary | ICD-10-CM

## 2013-08-05 NOTE — Progress Notes (Signed)
Pre visit review using our clinic review tool, if applicable. No additional management support is needed unless otherwise documented below in the visit note. 

## 2013-08-05 NOTE — Assessment & Plan Note (Signed)
Reviewed age and gender appropriate health maintenance issues (prudent diet, regular exercise, health risks of tobacco and excessive alcohol, use of seatbelts, fire alarms in home, use of sunscreen).  Also reviewed age and gender appropriate health screening as well as vaccine recommendations. UTD on vaccines. 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  Will monitor CBC q22mo for now.   Regarding LDL 146, encouraged ongoing focus on TLC, no meds at this time.

## 2013-08-05 NOTE — Progress Notes (Signed)
Office Note 08/05/2013  CC:  Chief Complaint  Patient presents with  . Annual Exam   HPI:  Roy [name] is a 43 y.o. White male who is here for annual CPE.  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  No acute complaints. Has seen Dr. Tamala Julian and is pleased with his eval and plan and says things are improving some with right lateral epicondylitis and partial tear of common extensor near it's origination on the lateral epicondyle.  Using 1/4 of nitroglycerin patch, also mobic, also wearing wrist splint.  We reviewed his recent fasting labs in detail today.  Past Medical History  Diagnosis Date  . Diverticular disease 2009    Colonoscopy confirmed approx 2009 (diverticulitis x 2 episodes).    Past Surgical History  Procedure Laterality Date  . Wrist fracture surgery  2010    Hockey injury, plate inserted  . Knee arthroscopy      Bilat; 2003, 2004, 2010  . Shoulder arthroscopy      Bilat; 1993, 2000, 2007    Family History  Problem Relation Age of Onset  . Cancer Maternal Grandmother     pancreatic cancer  . Cancer Paternal Grandmother     colon  . Cancer Paternal Grandfather     lung (smoker)    History   Social History  . Marital Status: Married    Spouse Name: N/A    Number of Children: N/A  . Years of Education: N/A   Occupational History  . Not on file.   Social History Main Topics  . Smoking status: Never Smoker   . Smokeless tobacco: Never Used  . Alcohol Use: Not on file  . Drug Use: Not on file  . Sexual Activity: Not on file   Other Topics Concern  . Not on file   Social History Narrative   Married, 4 children from ages 33 yrs to 70 yrs.  No sibs.   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   Grad from [employer] in [place], [place].   Originally from [place], lived in [place] for a while, then relocated to [facility] [place] about 2010.   Exercises irregularly, no particular dietary focus.   No  T/A/Ds.    Outpatient Prescriptions Prior to Visit  Medication Sig Dispense Refill  . meloxicam (MOBIC) 15 MG tablet Take 1 tablet (15 mg total) by mouth daily.  30 tablet  0  . nitroGLYCERIN (NITRODUR - DOSED IN MG/24 HR) 0.2 mg/hr patch 1/4 patch daily  30 patch  1   No facility-administered medications prior to visit.    Allergies  Allergen Reactions  . Dayquil Multi-Symptom Cold-Flu [Pseudoephedrine-Apap-Dm] Hives    ROS Review of Systems  Constitutional: Negative for fever, chills, appetite change and fatigue.  HENT: Negative for congestion, dental problem, ear pain and sore throat.   Eyes: Negative for discharge, redness and visual disturbance.  Respiratory: Negative for cough, chest tightness, shortness of breath and wheezing.   Cardiovascular: Negative for chest pain, palpitations and leg swelling.  Gastrointestinal: Negative for nausea, vomiting, abdominal pain, diarrhea and blood in stool.  Genitourinary: Negative for dysuria, urgency, frequency, hematuria, flank pain and difficulty urinating.  Musculoskeletal: Negative for arthralgias (right forearm lateral epicodylits+ partial extensor tear--wearing wrist brace to limit extension), back pain, joint swelling, myalgias and neck stiffness.  Skin: Negative for pallor and rash.  Neurological: Negative for dizziness, speech difficulty, weakness and headaches.  Hematological: Negative for adenopathy. Does not bruise/bleed easily.  Psychiatric/Behavioral:  Negative for confusion and sleep disturbance. The patient is not nervous/anxious.     PE; Blood pressure 114/79, pulse 66, temperature 98.6 F (37 C), temperature source Temporal, resp. rate 18, height 6\' 1"  (1.854 m), weight 239 lb (108.41 kg), SpO2 96.00%. Gen: Alert, well appearing.  Patient is oriented to person, place, time, and situation. AFFECT: pleasant, lucid thought and speech. ENT: Ears: EACs clear, normal epithelium.  TMs with good light reflex and landmarks  bilaterally.  Eyes: no injection, icteris, swelling, or exudate.  EOMI, PERRLA. Nose: no drainage or turbinate edema/swelling.  No injection or focal lesion.  Mouth: lips without lesion/swelling.  Oral mucosa pink and moist.  Dentition intact and without obvious caries or gingival swelling.  Oropharynx without erythema, exudate, or swelling.  Neck: supple/nontender.  No LAD, mass, or TM.  Carotid pulses 2+ bilaterally, without bruits. CV: RRR, no m/r/g.   LUNGS: CTA bilat, nonlabored resps, good aeration in all lung fields. ABD: soft, NT, ND, BS normal.  No hepatospenomegaly or mass.  No bruits. EXT: no clubbing, cyanosis, or edema.  Musculoskeletal: no joint swelling, erythema, warmth, or tenderness.  ROM of all joints intact. Skin - no sores or suspicious lesions or rashes or color changes  Pertinent labs:  Lab Results  Component Value Date   WBC 4.2 08/04/2013   HGB 15.9 08/04/2013   HCT 46.6 08/04/2013   MCV 95.0 08/04/2013   PLT 120.0* 08/04/2013     Chemistry      Component Value Date/Time   NA 139 08/04/2013 0801   K 4.4 08/04/2013 0801   CL 106 08/04/2013 0801   CO2 28 08/04/2013 0801   BUN 19 08/04/2013 0801   CREATININE 1.0 08/04/2013 0801      Component Value Date/Time   CALCIUM 9.3 08/04/2013 0801   ALKPHOS 70 08/04/2013 0801   AST 21 08/04/2013 0801   ALT 15 08/04/2013 0801   BILITOT 0.9 08/04/2013 0801     Lab Results  Component Value Date   TSH 1.64 08/04/2013   Lab Results  Component Value Date   CHOL 200 08/04/2013   HDL 40.40 08/04/2013   LDLCALC 147* 08/04/2013   LDLDIRECT 147.5 07/28/2012   TRIG 65.0 08/04/2013   CHOLHDL 5 08/04/2013    ASSESSMENT AND PLAN:   Health maintenance examination Reviewed age and gender appropriate health maintenance issues (prudent diet, regular exercise, health risks of tobacco and excessive alcohol, use of seatbelts, fire alarms in home, use of sunscreen).  Also reviewed age and gender appropriate health screening as well as vaccine  recommendations. UTD on vaccines. 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  Will monitor CBC q78mo for now.   Regarding LDL 146, encouraged ongoing focus on TLC, no meds at this time.  An After Visit Summary was printed and given to the patient.  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  FOLLOW UP:  Return for 6 mo lab visit for CBC; 12 mo return for CPE.

## 2013-08-13 ENCOUNTER — Encounter: Payer: Self-pay | Admitting: Family Medicine

## 2013-08-13 ENCOUNTER — Other Ambulatory Visit (INDEPENDENT_AMBULATORY_CARE_PROVIDER_SITE_OTHER): Payer: BC Managed Care – PPO

## 2013-08-13 ENCOUNTER — Ambulatory Visit (INDEPENDENT_AMBULATORY_CARE_PROVIDER_SITE_OTHER): Payer: BC Managed Care – PPO | Admitting: Family Medicine

## 2013-08-13 VITALS — BP 122/80 | HR 67 | Ht 73.0 in | Wt 239.0 lb

## 2013-08-13 DIAGNOSIS — M25529 Pain in unspecified elbow: Secondary | ICD-10-CM

## 2013-08-13 DIAGNOSIS — M25521 Pain in right elbow: Secondary | ICD-10-CM

## 2013-08-13 DIAGNOSIS — M7710 Lateral epicondylitis, unspecified elbow: Secondary | ICD-10-CM

## 2013-08-13 DIAGNOSIS — M7711 Lateral epicondylitis, right elbow: Secondary | ICD-10-CM

## 2013-08-13 NOTE — Progress Notes (Signed)
  [name] [address] [place], [place] [address] Phone: [phone] Subjective:      CC: Right elbow pain, follow up.   FFM:[id number] Roy [name] is a 43 y.o. male coming in with complaint of right elbow pain. Patient was seen previously and was diagnosed with a lateral epicondylitis with common extensor tendon tear. Patient was given nitroglycerin protocol as well as wrist brace at home exercise program. Patient states that he is about 50-60% better. Patient is still having some mild pain mostly over the lateral epicondylar region still. Patient is able to do all activities. Patient is wearing the braces at night. Patient is doing a nitroglycerin patch without any side effects. Patient of course would like to be able to get back to him more exercises. No new symptoms.     Past medical history, social, surgical and family history all reviewed in electronic medical record.   Review of Systems: No headache, visual changes, nausea, vomiting, diarrhea, constipation, dizziness, abdominal pain, skin rash, fevers, chills, night sweats, weight loss, swollen lymph nodes, body aches, joint swelling, muscle aches, chest pain, shortness of breath, mood changes.   Objective Blood pressure 122/80, pulse 67, height 6\' 1"  (1.854 m), weight 239 lb (108.41 kg), SpO2 97.00%.  General: No apparent distress alert and oriented x3 mood and affect normal, dressed appropriately.  HEENT: Pupils equal, extraocular movements intact  Respiratory: Patient's speak in full sentences and does not appear short of breath  Cardiovascular: No lower extremity edema, non tender, no erythema  Skin: Warm dry intact with no signs of infection or rash on extremities or on axial skeleton.  Abdomen: Soft nontender  Neuro: Cranial nerves II through XII are intact, neurovascularly intact in all extremities with 2+ DTRs and 2+ pulses.  Lymph: No lymphadenopathy of posterior or anterior  cervical chain or axillae bilaterally.  Gait normal with good balance and coordination.  MSK:  Non tender with full range of motion and good stability and symmetric strength and tone of shoulders, , wrist, hip, knee and ankles bilaterally.  Elbow: Right Unremarkable to inspection. Range of motion full pronation, supination, flexion, extension. Strength is full to all of the above directions Stable to varus, valgus stress. Negative moving valgus stress test. Tender to palpation over the lateral epicondylar region but does have some increasing strength from previous exam. Ulnar nerve does not sublux. Negative cubital tunnel Tinel's. Contralateral exam unremarkable  Musculoskeletal ultrasound was performed and interpreted by Charlann Boxer D.O.   Elbow:  Lateral epicondyle and common extensor tendon origin visualized. Patient no longer has hypoechoic changes in the area and tear is only approximately 5% of the tendon still left. Patient does have scar tissue and has changed it into more of a tendon.. There does appear to be a potential for a rheumatoid nodule in this area. Radial head unremarkable and located in annular ligament Medial epicondyle and common flexor tendon origin visualized.  No edema, effusions, or avulsions seen. Ulnar nerve in cubital tunnel unremarkable. Olecranon and triceps insertion visualized and unremarkable without edema, effusion, or avulsion.  No signs olecranon bursitis. Power doppler signal normal.  IMPRESSION:  Lateral epicondylitis with common extensor tendon tear improving significantly with no hypoechoic changes for questionable rheumatoid nodule noted.     Impression and Recommendations:     This case required medical decision making of moderate complexity.

## 2013-08-13 NOTE — Patient Instructions (Signed)
Good to see you Continue the patch You can take the meloxicam if you want. Lets do another 10 days at least Ice still after activity.  Start lifting at 30% of what you were doing and increase 10% every week.  Any pain then go back down on weight My exercises 3 times a week.  Sleep in braces for another 2 weeks.  Come back in 4 weeks.

## 2013-08-13 NOTE — Assessment & Plan Note (Signed)
Patient is doing better overall. Patient has increased healing with a nitroglycerin patch and does not have any side effects. Patient given phase II exercises to do. We discussed icing protocol canalis be more beneficial. Patient is going to do the exercises more regular basis we discussed getting patient back to weightlifting and 30% and increasing slowly over the course of the next several weeks. Patient will come back again in 3-4 weeks for further evaluation and treatment.  Spent greater than 25 minutes with patient face-to-face and had greater than 50% of counseling including as described above in assessment and plan.

## 2013-09-10 ENCOUNTER — Ambulatory Visit: Payer: BC Managed Care – PPO | Admitting: Family Medicine

## 2013-09-17 ENCOUNTER — Ambulatory Visit (INDEPENDENT_AMBULATORY_CARE_PROVIDER_SITE_OTHER): Payer: BC Managed Care – PPO | Admitting: Family Medicine

## 2013-09-17 ENCOUNTER — Other Ambulatory Visit (INDEPENDENT_AMBULATORY_CARE_PROVIDER_SITE_OTHER): Payer: BC Managed Care – PPO

## 2013-09-17 ENCOUNTER — Encounter: Payer: Self-pay | Admitting: Family Medicine

## 2013-09-17 VITALS — BP 130/80 | HR 61 | Ht 73.0 in | Wt 240.0 lb

## 2013-09-17 DIAGNOSIS — M7711 Lateral epicondylitis, right elbow: Secondary | ICD-10-CM

## 2013-09-17 DIAGNOSIS — M542 Cervicalgia: Secondary | ICD-10-CM | POA: Insufficient documentation

## 2013-09-17 DIAGNOSIS — M7710 Lateral epicondylitis, unspecified elbow: Secondary | ICD-10-CM

## 2013-09-17 MED ORDER — DICLOFENAC SODIUM 2 % TD SOLN: TRANSDERMAL | Status: DC

## 2013-09-17 NOTE — Assessment & Plan Note (Signed)
superfical to touch, likely tender to the fat pad after direct compression. Topical anti-inflammatories given to try. We discussed avoiding direct contact with the bar and using a pad of some sort. Patient will try these interventions and continuing to have further pain he'll come back for further workup.

## 2013-09-17 NOTE — Assessment & Plan Note (Signed)
Patient seems to be near completely resolved at this time. Very small tear still appreciated. We discussed decreasing a nitroglycerin that is stopping her completely at this time. Patient will go to every other day for the next 2 weeks then as needed. Continue with the icing. Continue with a slow increase in weight over the course of the next 4 weeks. Patient will come back in 4 weeks if continued have any type of pain.

## 2013-09-17 NOTE — Patient Instructions (Signed)
Great to see you.  Ice is still your friend.  Exercises 3 times a week.  With the patch go to every other day for next 2 weeks.  If pain worsens the restart daily. Discontinue 2 weeks.  Continue at 50% then increase 10% a week.  For your neck use   pad with squats or front.  Try the pennsaid 2 times daily.  See me again in 4-6 weeks if still in pain.

## 2013-09-17 NOTE — Progress Notes (Signed)
[name] [address], [place] [address] Phone: [phone] Subjective:      CC: Right elbow pain, follow up.   ASN:[id number] Roy [name] is a 43 y.o. male coming in with complaint of right elbow pain. Patient was seen previously and was diagnosed with a lateral epicondylitis with common extensor tendon tear. Patient was given nitroglycerin protocol as well as wrist brace at home exercise program. Patient is doing about 50-60% better. We continued with the conservative therapy. Patient states since then he continues to improve. Patient has actually stopped a nitroglycerin this week. Patient is not taking any anti-inflammatories a regular basis. Overall has been feeling significantly better. Still minor twinge with heavy lifting but otherwise feeling good. Patient has not increase his lifting past 50% at this time.  In addition this patient has some mild tenderness on his neck. Patient only notices this when he has direct contact such as when he has a squat bar on his neck. No radiation into arms or any numbness. States that it is just more painful to compression. Denies any nighttime awakening. Denies any fevers or chills. No masses palpated. No recent illnesses. States that the pain is 3/10.     Past medical history, social, surgical and family history all reviewed in electronic medical record.   Review of Systems: No headache, visual changes, nausea, vomiting, diarrhea, constipation, dizziness, abdominal pain, skin rash, fevers, chills, night sweats, weight loss, swollen lymph nodes, body aches, joint swelling, muscle aches, chest pain, shortness of breath, mood changes.   Objective Blood pressure 130/80, pulse 61, height 6\' 1"  (1.854 m), weight 240 lb (108.863 kg), SpO2 97.00%.  General: No apparent distress alert and oriented x3 mood and affect normal, dressed appropriately.  HEENT: Pupils equal, extraocular movements intact    Respiratory: Patient's speak in full sentences and does not appear short of breath  Cardiovascular: No lower extremity edema, non tender, no erythema  Skin: Warm dry intact with no signs of infection or rash on extremities or on axial skeleton.  Abdomen: Soft nontender  Neuro: Cranial nerves II through XII are intact, neurovascularly intact in all extremities with 2+ DTRs and 2+ pulses.  Lymph: No lymphadenopathy of posterior or anterior cervical chain or axillae bilaterally.  Gait normal with good balance and coordination.  MSK:  Non tender with full range of motion and good stability and symmetric strength and tone of shoulders, , wrist, hip, knee and ankles bilaterally.  Neck: Inspection unremarkable. No palpable stepoffs. Negative Spurling's maneuver. Full neck range of motion Patient is minimally tender to palpation right at the cervical thoracic junction. No redness noted and no masses palpated. Grip strength and sensation normal in bilateral hands Strength good C4 to T1 distribution No sensory change to C4 to T1 Negative Hoffman sign bilaterally Reflexes normal Elbow: Right Unremarkable to inspection. Range of motion full pronation, supination, flexion, extension. Strength is full to all of the above directions Stable to varus, valgus stress. Negative moving valgus stress test. Nontender on exam today Ulnar nerve does not sublux. Negative cubital tunnel Tinel's. Contralateral exam unremarkable  Musculoskeletal ultrasound was performed and interpreted by Charlann Boxer D.O.   Elbow:  Lateral epicondyle and common extensor tendon origin visualized. Tear of common extensor tendon is almost completely resolved. Patient scar tissue is turned into mostly regular tendon at this time. Potential nodule still present. Radial head unremarkable and located in annular ligament Medial epicondyle and common flexor tendon origin visualized.  No  edema, effusions, or avulsions seen. Ulnar nerve  in cubital tunnel unremarkable. Olecranon and triceps insertion visualized and unremarkable without edema, effusion, or avulsion.  No signs olecranon bursitis. Power doppler signal normal.  IMPRESSION:  Near resolved lateral epicondylitis with less than 5% tear still remaining in the common extensor tendon. Rheumatoid nodules still noted.     Impression and Recommendations:     This case required medical decision making of moderate complexity.

## 2013-10-19 ENCOUNTER — Other Ambulatory Visit (INDEPENDENT_AMBULATORY_CARE_PROVIDER_SITE_OTHER): Payer: BC Managed Care – PPO

## 2013-10-19 ENCOUNTER — Ambulatory Visit (INDEPENDENT_AMBULATORY_CARE_PROVIDER_SITE_OTHER): Payer: BC Managed Care – PPO | Admitting: Family Medicine

## 2013-10-19 ENCOUNTER — Encounter: Payer: Self-pay | Admitting: Family Medicine

## 2013-10-19 VITALS — BP 112/80 | HR 61 | Ht 73.0 in | Wt 239.0 lb

## 2013-10-19 DIAGNOSIS — M79641 Pain in right hand: Secondary | ICD-10-CM

## 2013-10-19 DIAGNOSIS — S6390XA Sprain of unspecified part of unspecified wrist and hand, initial encounter: Secondary | ICD-10-CM

## 2013-10-19 DIAGNOSIS — M79609 Pain in unspecified limb: Secondary | ICD-10-CM

## 2013-10-19 DIAGNOSIS — S63619A Unspecified sprain of unspecified finger, initial encounter: Secondary | ICD-10-CM | POA: Insufficient documentation

## 2013-10-19 NOTE — Progress Notes (Signed)
  [name] [address] [place], [place] [address] Phone: [phone] Subjective:      CC: Right middle finger pain  HMC:[id number] Roy [name] is a 43 y.o. male coming in with complaint of right middle finger pain. Patient states that this is new. Patient states that he was jumping in his pain unfortunately banging his hands and extreme extension he states. States that he had swelling immediately and then since then no no bruising. Patient states that this occurred approximately 3 weeks ago. Still has pain with any extension of his finger. Seems to be localized around the middle finger. States that if he tries to grab something hard he has some discomfort. No wrist pain, no numbness, no weakness noted. States she is a dull aching pain. Severity a 6/10     Past medical history, social, surgical and family history all reviewed in electronic medical record.   Review of Systems: No headache, visual changes, nausea, vomiting, diarrhea, constipation, dizziness, abdominal pain, skin rash, fevers, chills, night sweats, weight loss, swollen lymph nodes, body aches, joint swelling, muscle aches, chest pain, shortness of breath, mood changes.   Objective Blood pressure 112/80, pulse 61, height 6\' 1"  (1.854 m), weight 239 lb (108.41 kg), SpO2 97.00%.  General: No apparent distress alert and oriented x3 mood and affect normal, dressed appropriately.  HEENT: Pupils equal, extraocular movements intact  Respiratory: Patient's speak in full sentences and does not appear short of breath  Cardiovascular: No lower extremity edema, non tender, no erythema  Skin: Warm dry intact with no signs of infection or rash on extremities or on axial skeleton.  Abdomen: Soft nontender  Neuro: Cranial nerves II through XII are intact, neurovascularly intact in all extremities with 2+ DTRs and 2+ pulses.  Lymph: No lymphadenopathy of posterior or anterior cervical chain or axillae  bilaterally.  Gait normal with good balance and coordination.  MSK:  Non tender with full range of motion and good stability and symmetric strength and tone of shoulders, elbows, wrist, hip, knee and ankles bilaterally.  Hand exam shows no angulation of the knee and the fingertips and has full range of motion. He is tender to palpation over the MP joint just proximal to the joint on the radial aspect of the middle finger. Neurovascularly intact distally. Good grip strength.  Limited musculoskeletal ultrasound was performed and interpreted by Hulan Saas, M Limited ultrasound of the middle finger at the MP joint shows just proximal to the joint patient had what appeared to be an avulsion fracture that is healing. Good callus formation noted with increasing Doppler flow. No displacement noted. Ligament appears to be intact.  Impression: Healing radial side avulsion fracture proximal to the MP joint of the middle finger on the right hand    Impression and Recommendations:     This case required medical decision making of moderate complexity.

## 2013-10-19 NOTE — Patient Instructions (Signed)
Good to see you Ice still at end of long days Wear brace at night for 2 weeks Use the pennsaid twice daily for 5 days then as needed Avoid extension of hand with lifts.  Have a great trip Lifting decrease 80% and increase 10% a week next 2 weeks.  Come back in 3 weeks if not perfect

## 2013-10-19 NOTE — Assessment & Plan Note (Signed)
Discussed with patient that he is healing even without any conservative therapy at this time. We will start bracing at night, icing protocol, topical anti-inflammatories. Patient is going out of town for the next 2 weeks. We discussed avoiding any significant heavy lifting if possible as well as extension of his finger. Patient and will come back again in 3-4 weeks for further evaluation and treatment.  Spent greater than 25 minutes with patient face-to-face and had greater than 50% of counseling including as described above in assessment and plan.

## 2013-10-27 ENCOUNTER — Ambulatory Visit: Payer: BC Managed Care – PPO | Admitting: Family Medicine

## 2014-01-03 ENCOUNTER — Encounter: Payer: Self-pay | Admitting: Family Medicine

## 2014-01-03 ENCOUNTER — Ambulatory Visit (INDEPENDENT_AMBULATORY_CARE_PROVIDER_SITE_OTHER): Payer: BC Managed Care – PPO | Admitting: Family Medicine

## 2014-01-03 VITALS — BP 114/81 | HR 72 | Temp 98.2°F | Resp 18 | Ht 73.0 in | Wt 236.0 lb

## 2014-01-03 DIAGNOSIS — J018 Other acute sinusitis: Secondary | ICD-10-CM

## 2014-01-03 DIAGNOSIS — J302 Other seasonal allergic rhinitis: Secondary | ICD-10-CM

## 2014-01-03 MED ORDER — AMOXICILLIN 875 MG PO TABS: 875.0000 mg | ORAL_TABLET | Freq: Two times a day (BID) | ORAL | Status: AC

## 2014-01-03 MED ORDER — FEXOFENADINE HCL 180 MG PO TABS
ORAL_TABLET | ORAL | Status: DC
Start: 2014-01-03 — End: 2014-03-23

## 2014-01-03 NOTE — Patient Instructions (Signed)
Buy saline nasal spray (generic, otc) and use to clear nasal passages 2-3 times per day.

## 2014-01-03 NOTE — Progress Notes (Signed)
Pre visit review using our clinic review tool, if applicable. No additional management support is needed unless otherwise documented below in the visit note. 

## 2014-01-03 NOTE — Progress Notes (Signed)
OFFICE NOTE  01/03/2014  CC:  Chief Complaint  Patient presents with  . Cough    x 5 days, mucinex DM  . Nasal Congestion   HPI: Patient is a 43 y.o. Caucasian male who is here for resp complaints.   One week severe nasal congestion with mucous production, HA, cough.  No SOB, wheezing, or chest tightness.  No fevers. No upper teeth pain, no facial pain.  Feels lots of facial pressure and PND.  Voice hoarse/lower lately.  Some ST with hacking cough initially but this is better now.  No OTC meds tried.  Pertinent PMH:  Past medical, surgical, social, and family history reviewed and no changes are noted since last office visit.  MEDS:  Outpatient Prescriptions Prior to Visit  Medication Sig Dispense Refill  . Diclofenac Sodium (PENNSAID) 2 % SOLN Apply twice daily. 112 g 1  . meloxicam (MOBIC) 15 MG tablet Take 1 tablet (15 mg total) by mouth daily. 30 tablet 0  . nitroGLYCERIN (NITRODUR - DOSED IN MG/24 HR) 0.2 mg/hr patch 1/4 patch daily 30 patch 1   No facility-administered medications prior to visit.    PE: Blood pressure 114/81, pulse 72, temperature 98.2 F (36.8 C), temperature source Temporal, resp. rate 18, height 6\' 1"  (1.854 m), weight 236 lb (107.049 kg), SpO2 97 %. VS: noted--normal. Gen: alert, NAD, NONTOXIC APPEARING. HEENT: eyes without injection, drainage, or swelling.  Ears: EACs clear, TMs with normal light reflex and landmarks.  Nose: Clear rhinorrhea, with some dried, crusty exudate adherent to mildly injected mucosa.  No purulent d/c.  No paranasal sinus TTP.  No facial swelling.  Throat and mouth without focal lesion.  No pharyngial swelling, erythema, or exudate.   Neck: supple, no LAD.   LUNGS: CTA bilat, nonlabored resps.   CV: RRR, no m/r/g. EXT: no c/c/e SKIN: no rash   IMPRESSION AND PLAN:  Acute sinusitis. Amoxil 875 mg bid x 10d. Saline nasal spray, allegra 180mg  qd. May try mucinex DM otc prn.  An After Visit Summary was printed and given  to the patient.  FOLLOW UP: prn

## 2014-01-18 ENCOUNTER — Ambulatory Visit (INDEPENDENT_AMBULATORY_CARE_PROVIDER_SITE_OTHER): Payer: BC Managed Care – PPO | Admitting: Family Medicine

## 2014-01-18 ENCOUNTER — Encounter: Payer: Self-pay | Admitting: Family Medicine

## 2014-01-18 VITALS — BP 114/87 | HR 78 | Temp 98.3°F | Resp 18

## 2014-01-18 DIAGNOSIS — R062 Wheezing: Secondary | ICD-10-CM

## 2014-01-18 DIAGNOSIS — J209 Acute bronchitis, unspecified: Secondary | ICD-10-CM

## 2014-01-18 DIAGNOSIS — J018 Other acute sinusitis: Secondary | ICD-10-CM

## 2014-01-18 MED ORDER — ALBUTEROL SULFATE HFA 108 (90 BASE) MCG/ACT IN AERS
1.0000 | INHALATION_SPRAY | RESPIRATORY_TRACT | Status: DC | PRN
Start: 2014-01-18 — End: 2014-03-23

## 2014-01-18 MED ORDER — LEVOFLOXACIN 500 MG PO TABS: 500.0000 mg | ORAL_TABLET | Freq: Every day | ORAL | Status: DC

## 2014-01-18 MED ORDER — PREDNISONE 20 MG PO TABS: ORAL_TABLET | ORAL | Status: DC

## 2014-01-18 NOTE — Progress Notes (Signed)
Pre visit review using our clinic review tool, if applicable. No additional management support is needed unless otherwise documented below in the visit note. 

## 2014-01-18 NOTE — Progress Notes (Signed)
OFFICE NOTE  01/18/2014  CC:  Chief Complaint  Patient presents with  . Follow-up  . Cough   HPI: Patient is a 43 y.o. Caucasian male who is here for f/u recent resp illness. I saw him 2 wks ago and rx'd 10 d of amoxil for prolonged URI with cough. Lots of drainage from nose to back of throat.  Occ blows mucous out nose and it is clear/yellow now but still very thick/sticky.  Still peri-orbital/fronal HA and head pressure, lots of PND coughing and coughing "fits" and feeling in chest, esp when he tries to do anything active or requiring deep breaths. No wheezing.  Tried allegra--no help.  Tried sudafed--no help.  ROS: no fevers, no ST, no n/v, no facial/upper teeth pain.  Pertinent PMH:  Past medical, surgical, social, and family history reviewed and no changes are noted since last office visit.  MEDS:  Outpatient Prescriptions Prior to Visit  Medication Sig Dispense Refill  . fexofenadine (ALLEGRA) 180 MG tablet 1 tab po qd for allergic nasal symptoms 30 tablet 11   No facility-administered medications prior to visit.   PE: Blood pressure 114/87, pulse 78, temperature 98.3 F (36.8 C), temperature source Temporal, resp. rate 18, SpO2 96 %. VS: noted--normal. Gen: alert, NAD, NONTOXIC APPEARING. HEENT: eyes without injection, drainage, or swelling.  Ears: EACs clear, TMs with normal light reflex and landmarks.  Nose: Clear rhinorrhea, with some dried, crusty exudate adherent to mildly injected mucosa.  No purulent d/c.  No paranasal sinus TTP.  No facial swelling.  Throat and mouth without focal lesion.  No pharyngial swelling, erythema, or exudate.   Neck: supple, no LAD.   LUNGS: CTA bilat, nonlabored resps.   CV: RRR, no m/r/g. EXT: no c/c/e SKIN: no rash  IMPRESSION AND PLAN:  Prolonged upper and lower resp illness. I still think he has some resistant bacterial infection in sinuses: will treat with levaquin 500mg  qd x 10d. Additionally, he describes a bronchospasm  component of his illness.  Will treat with prednisone 40mg  qd x 5d and ventolin HFA 1-2 puffs q4h prn.  CMA Jacklynn Ganong did inhaler education with pt today. Therapeutic expectations and side effect profile of medication discussed today.  Patient's questions answered.  Consideration was given to the role of silent LPR contributing to symptoms but I did not treat this today. If he returns with ongoing sx's but no worsening after today's treatments then will look into this possibility more. An After Visit Summary was printed and given to the patient.  FOLLOW UP: prn

## 2014-03-04 ENCOUNTER — Ambulatory Visit (INDEPENDENT_AMBULATORY_CARE_PROVIDER_SITE_OTHER): Payer: 59 | Admitting: Family Medicine

## 2014-03-04 ENCOUNTER — Encounter: Payer: Self-pay | Admitting: Family Medicine

## 2014-03-04 VITALS — BP 118/82 | HR 80 | Temp 97.9°F | Resp 18 | Ht 73.0 in | Wt 233.0 lb

## 2014-03-04 DIAGNOSIS — R1013 Epigastric pain: Secondary | ICD-10-CM

## 2014-03-04 DIAGNOSIS — R6881 Early satiety: Secondary | ICD-10-CM

## 2014-03-04 LAB — COMPREHENSIVE METABOLIC PANEL
ALBUMIN: 4.3 g/dL (ref 3.5–5.2)
ALT: 16 U/L (ref 0–53)
AST: 40 U/L — ABNORMAL HIGH (ref 0–37)
Alkaline Phosphatase: 70 U/L (ref 39–117)
BUN: 13 mg/dL (ref 6–23)
CO2: 28 mEq/L (ref 19–32)
Calcium: 9.1 mg/dL (ref 8.4–10.5)
Chloride: 106 mEq/L (ref 96–112)
Creatinine, Ser: 1.1 mg/dL (ref 0.4–1.5)
GFR: 78.41 mL/min (ref >60.00)
Glucose, Bld: 105 mg/dL — ABNORMAL HIGH (ref 70–99)
POTASSIUM: 4 meq/L (ref 3.5–5.1)
Sodium: 141 mEq/L (ref 135–145)
Total Bilirubin: 1.4 mg/dL — ABNORMAL HIGH (ref 0.2–1.2)
Total Protein: 7.1 g/dL (ref 6.0–8.3)

## 2014-03-04 LAB — CBC WITH DIFFERENTIAL/PLATELET
BASOS PCT: 0.7 % (ref 0.0–3.0)
Basophils Absolute: 0 10*3/uL (ref 0.0–0.1)
Eosinophils Absolute: 0.2 10*3/uL (ref 0.0–0.7)
Eosinophils Relative: 4.3 % (ref 0.0–5.0)
HCT: 47 % (ref 39.0–52.0)
HEMOGLOBIN: 15.7 g/dL (ref 13.0–17.0)
Lymphocytes Relative: 24.9 % (ref 12.0–46.0)
Lymphs Abs: 0.9 10*3/uL (ref 0.7–4.0)
MCHC: 33.4 g/dL (ref 30.0–36.0)
MCV: 96.1 fl (ref 78.0–100.0)
Monocytes Absolute: 0.5 10*3/uL (ref 0.1–1.0)
Monocytes Relative: 12.1 % — ABNORMAL HIGH (ref 3.0–12.0)
NEUTROS ABS: 2.2 10*3/uL (ref 1.4–7.7)
NEUTROS PCT: 58 % (ref 43.0–77.0)
Platelets: 142 10*3/uL — ABNORMAL LOW (ref 150.0–400.0)
RBC: 4.89 Mil/uL (ref 4.22–5.81)
RDW: 13.4 % (ref 11.5–15.5)
WBC: 3.8 10*3/uL — AB (ref 4.0–10.5)

## 2014-03-04 LAB — IGA: IGA: 261 mg/dL (ref 68–378)

## 2014-03-04 MED ORDER — OMEPRAZOLE 40 MG PO CPDR: 40.0000 mg | DELAYED_RELEASE_CAPSULE | Freq: Every day | ORAL | Status: DC

## 2014-03-04 NOTE — Progress Notes (Addendum)
OFFICE NOTE  03/04/2014  CC:  Chief Complaint  Patient presents with  . Gastrophageal Reflux   HPI: Patient is a 44 y.o. Caucasian male who is here for a prolonged sense (weeks) of epigastric fullness, loss of appetite, burping a lot, low energy level: "felt like crap in general".  Denies substernal burning/heartburn sensation.  No chest pain or pressure.  No nausea or vomiting.  No ST or loss of voice.  No cough. Tums and OTC gas med wasn't doing much.  +Early satiety.  No middle or lower abd pain.   No melena or hematochezia.   Stools are formed, soft.  These same symptoms have been a problem for him in the past but nothing this persistent.  Pertinent PMH:  Past medical, surgical, social, and family history reviewed and no changes are noted since last office visit.  MEDS:  Allegra 180mg  qd, albuterol MDI q6h prn  PE: Blood pressure 118/82, pulse 80, temperature 97.9 F (36.6 C), temperature source Temporal, resp. rate 18, height 6\' 1"  (1.854 m), weight 233 lb (105.688 kg), SpO2 96 %. Gen: Alert, well appearing.  Patient is oriented to person, place, time, and situation. BEE:FEOF: no injection, icteris, swelling, or exudate.  EOMI, PERRLA. Mouth: lips without lesion/swelling.  Oral mucosa pink and moist. Oropharynx without erythema, exudate, or swelling.  CV: RRR, no m/r/g.   LUNGS: CTA bilat, nonlabored resps, good aeration in all lung fields. ABD: soft, NT, ND, BS normal.  No hepatospenomegaly or mass.  No bruits. EXT: no clubbing, cyanosis, or edema.    IMPRESSION AND PLAN:  Dyspepsia with early satiety. Will check CBC, CMET, IgA level, TTG IgA, and will ask GI to see for consideration of further testing (EGD). Start prilosec 40mg  qd. Signs/symptoms to call or return for were reviewed and pt expressed understanding.  An After Visit Summary was printed and given to the patient.  FOLLOW UP: prn

## 2014-03-04 NOTE — Progress Notes (Signed)
Pre visit review using our clinic review tool, if applicable. No additional management support is needed unless otherwise documented below in the visit note. 

## 2014-03-07 ENCOUNTER — Encounter: Payer: Self-pay | Admitting: Internal Medicine

## 2014-03-08 ENCOUNTER — Telehealth: Payer: Self-pay | Admitting: Family Medicine

## 2014-03-08 DIAGNOSIS — R1013 Epigastric pain: Secondary | ICD-10-CM

## 2014-03-08 DIAGNOSIS — R7401 Elevation of levels of liver transaminase levels: Secondary | ICD-10-CM

## 2014-03-08 DIAGNOSIS — R740 Nonspecific elevation of levels of transaminase and lactic acid dehydrogenase [LDH]: Secondary | ICD-10-CM

## 2014-03-08 NOTE — Telephone Encounter (Signed)
Spoke with pt about mildly elevated AST and T bili. In the context of his epigastric pain, I recommended he come by at his convenience to repeat these tests, plus I recommended we do an abd u/s prior to his GI consult that is scheduled for the end of this month.  Pt understood/agreed.

## 2014-03-09 ENCOUNTER — Ambulatory Visit (HOSPITAL_BASED_OUTPATIENT_CLINIC_OR_DEPARTMENT_OTHER)
Admission: RE | Admit: 2014-03-09 | Discharge: 2014-03-09 | Disposition: A | Discharge status: Home / Self Care | Payer: 59 | Source: Ambulatory Visit | Attending: Family Medicine | Admitting: Family Medicine

## 2014-03-09 DIAGNOSIS — R6881 Early satiety: Secondary | ICD-10-CM | POA: Insufficient documentation

## 2014-03-09 DIAGNOSIS — R140 Abdominal distension (gaseous): Secondary | ICD-10-CM | POA: Diagnosis not present

## 2014-03-09 DIAGNOSIS — R630 Anorexia: Secondary | ICD-10-CM | POA: Diagnosis not present

## 2014-03-09 DIAGNOSIS — R7401 Elevation of levels of liver transaminase levels: Secondary | ICD-10-CM

## 2014-03-09 DIAGNOSIS — R740 Nonspecific elevation of levels of transaminase and lactic acid dehydrogenase [LDH]: Secondary | ICD-10-CM

## 2014-03-09 DIAGNOSIS — R1013 Epigastric pain: Secondary | ICD-10-CM

## 2014-03-10 LAB — GLIADIN IGA+TTG IGA

## 2014-03-23 ENCOUNTER — Other Ambulatory Visit (INDEPENDENT_AMBULATORY_CARE_PROVIDER_SITE_OTHER): Payer: 59

## 2014-03-23 ENCOUNTER — Ambulatory Visit (INDEPENDENT_AMBULATORY_CARE_PROVIDER_SITE_OTHER): Payer: 59 | Admitting: Internal Medicine

## 2014-03-23 ENCOUNTER — Encounter: Payer: Self-pay | Admitting: Internal Medicine

## 2014-03-23 VITALS — BP 106/82 | HR 76 | Ht 72.5 in | Wt 233.4 lb

## 2014-03-23 DIAGNOSIS — R7401 Elevation of levels of liver transaminase levels: Secondary | ICD-10-CM

## 2014-03-23 DIAGNOSIS — R1013 Epigastric pain: Secondary | ICD-10-CM

## 2014-03-23 DIAGNOSIS — R142 Eructation: Secondary | ICD-10-CM

## 2014-03-23 DIAGNOSIS — K589 Irritable bowel syndrome without diarrhea: Secondary | ICD-10-CM

## 2014-03-23 DIAGNOSIS — R740 Nonspecific elevation of levels of transaminase and lactic acid dehydrogenase [LDH]: Secondary | ICD-10-CM

## 2014-03-23 DIAGNOSIS — Z8719 Personal history of other diseases of the digestive system: Secondary | ICD-10-CM

## 2014-03-23 LAB — H. PYLORI ANTIBODY, IGG: H Pylori IgG: NEGATIVE

## 2014-03-23 LAB — COMPREHENSIVE METABOLIC PANEL
ALK PHOS: 72 U/L (ref 39–117)
ALT: 19 U/L (ref 0–53)
AST: 18 U/L (ref 0–37)
Albumin: 4.3 g/dL (ref 3.5–5.2)
BUN: 17 mg/dL (ref 6–23)
CO2: 27 mEq/L (ref 19–32)
CREATININE: 1.12 mg/dL (ref 0.40–1.50)
Calcium: 9.4 mg/dL (ref 8.4–10.5)
Chloride: 106 mEq/L (ref 96–112)
GFR: 75.97 mL/min (ref >60.00)
Glucose, Bld: 105 mg/dL — ABNORMAL HIGH (ref 70–99)
Potassium: 4.5 mEq/L (ref 3.5–5.1)
Sodium: 139 mEq/L (ref 135–145)
TOTAL PROTEIN: 7.4 g/dL (ref 6.0–8.3)
Total Bilirubin: 0.7 mg/dL (ref 0.2–1.2)

## 2014-03-23 LAB — HEPATITIS C ANTIBODY: HCV Ab: NEGATIVE

## 2014-03-23 LAB — HEPATITIS A ANTIBODY, TOTAL: Hep A Total Ab: NONREACTIVE

## 2014-03-23 LAB — HEPATITIS B CORE ANTIBODY, TOTAL: HEP B C TOTAL AB: NONREACTIVE

## 2014-03-23 LAB — HEPATITIS B SURFACE ANTIGEN: Hepatitis B Surface Ag: NEGATIVE

## 2014-03-23 LAB — HEPATITIS B SURFACE ANTIBODY,QUALITATIVE: HEP B S AB: NEGATIVE

## 2014-03-23 MED ORDER — RIFAXIMIN 550 MG PO TABS: 550.0000 mg | ORAL_TABLET | Freq: Three times a day (TID) | ORAL | Status: DC

## 2014-03-23 NOTE — Progress Notes (Signed)
Patient ID: Roy [name], male   DOB: 11-06-1970, 44 y.o.   MRN: [id number] HPI: Roy [name] is a 44 yo male with PMH of diverticulitis who is seen in consultation at the request of Dr. Anitra Lauth to evaluate early satiety, belching and dyspeptic symptoms. He is here alone today. He reports in mid to late December he developed decreasing appetite, feeling of fullness with early satiety and frequent belching. He did not have abdominal or epigastric pain during this time. He also denies nausea or vomiting. No true heartburn though in the past he has had heartburn and regurgitation. Bowel movements have been regular for him which are 3-4 times daily and can be urgent. No blood in his stool or melena. No increased flatulence. He was started on omeprazole 40 mg daily by primary care which he has been taking. He has had resolution of poor appetite and early satiety. He is now eating full meals without difficulty. He does continue to have frequent belching. He denies hiccups. No specific dietary triggers. Weight for him overall has been stable and he considers his weight today a good or normal weight for him. No itching, jaundice or trouble with edema.   Family history discussed. Maternal grandmother with colorectal cancer, his mother has had diverticulitis, father colon polyps at screening but benign, paternal grandfather lung cancer that worked in a Psychologist, counselling with asbestos exposure, paternal aunt with stomach cancer, maternal grandfather pancreatic cancer at age 49  He has had 3 bouts of diverticulitis that this is been several years ago. In 2009 he had a colonoscopy performed in [place] which revealed diverticulosis but was reportedly otherwise normal  He works as an Chief Financial Officer and is originally from [place], [place]  Past Medical History  Diagnosis Date  . Diverticulosis 2009    Colonoscopy confirmed approx 2009 (diverticulitis x 2 episodes).    Past Surgical History  Procedure Laterality  Date  . Wrist fracture surgery Left 2010    Hockey injury, plate inserted  . Knee arthroscopy Bilateral     Bilat; 2003, 2004, 2010  . Shoulder arthroscopy Bilateral     Bilat; 1993, 2000, 2007    Outpatient Prescriptions Prior to Visit  Medication Sig Dispense Refill  . omeprazole (PRILOSEC) 40 MG capsule Take 1 capsule (40 mg total) by mouth daily. 30 capsule 3  . albuterol (VENTOLIN HFA) 108 (90 BASE) MCG/ACT inhaler Inhale 1-2 puffs into the lungs every 4 (four) hours as needed for wheezing or shortness of breath. 1 Inhaler 0  . fexofenadine (ALLEGRA) 180 MG tablet 1 tab po qd for allergic nasal symptoms 30 tablet 11   No facility-administered medications prior to visit.    Allergies  Allergen Reactions  . Dayquil Multi-Symptom Cold-Flu [Pseudoephedrine-Apap-Dm] Hives    Family History  Problem Relation Age of Onset  . Pancreatic cancer Maternal Grandmother   . Colon cancer Paternal Grandmother   . Lung cancer Paternal Grandfather      (smoker)  . Colon polyps Father     History  Substance Use Topics  . Smoking status: Never Smoker   . Smokeless tobacco: Never Used  . Alcohol Use: 0.0 oz/week    0 Not specified per week     Comment: beer occasional    ROS: As per history of present illness, otherwise negative  BP 106/82 mmHg  Pulse 76  Ht 6' 0.5" (1.842 m)  Wt 233 lb 6 oz (105.858 kg)  BMI 31.20 kg/m2 Constitutional: Well-developed and well-nourished. No distress. HEENT: Normocephalic and  atraumatic. Oropharynx is clear and moist. No oropharyngeal exudate. Conjunctivae are normal.  No scleral icterus. Neck: Neck supple. Trachea midline. Cardiovascular: Normal rate, regular rhythm and intact distal pulses. No M/R/G Pulmonary/chest: Effort normal and breath sounds normal. No wheezing, rales or rhonchi. Abdominal: Soft, nontender, nondistended. Bowel sounds active throughout. There are no masses palpable. No hepatosplenomegaly. Extremities: no clubbing,  cyanosis, or edema Lymphadenopathy: No cervical adenopathy noted. Neurological: Alert and oriented to person place and time. Skin: Skin is warm and dry. No rashes noted. Psychiatric: Normal mood and affect. Behavior is normal.  RELEVANT LABS AND IMAGING: CBC    Component Value Date/Time   WBC 3.8* 03/04/2014 0835   RBC 4.89 03/04/2014 0835   HGB 15.7 03/04/2014 0835   HCT 47.0 03/04/2014 0835   PLT 142.0* 03/04/2014 0835   MCV 96.1 03/04/2014 0835   MCHC 33.4 03/04/2014 0835   RDW 13.4 03/04/2014 0835   LYMPHSABS 0.9 03/04/2014 0835   MONOABS 0.5 03/04/2014 0835   EOSABS 0.2 03/04/2014 0835   BASOSABS 0.0 03/04/2014 0835    CMP     Component Value Date/Time   NA 141 03/04/2014 0835   K 4.0 03/04/2014 0835   CL 106 03/04/2014 0835   CO2 28 03/04/2014 0835   GLUCOSE 105* 03/04/2014 0835   BUN 13 03/04/2014 0835   CREATININE 1.1 03/04/2014 0835   CALCIUM 9.1 03/04/2014 0835   PROT 7.1 03/04/2014 0835   ALBUMIN 4.3 03/04/2014 0835   AST 40* 03/04/2014 0835   ALT 16 03/04/2014 0835   ALKPHOS 70 03/04/2014 0835   BILITOT 1.4* 03/04/2014 0835   TTG, IgA - neg IgA normal  Lab Results  Component Value Date   TSH 1.64 08/04/2013       CLINICAL DATA:  Early satiety. Loss of appetite. Epigastric bloating. Abnormal blood work.   EXAM: ULTRASOUND ABDOMEN COMPLETE   COMPARISON:  None.   FINDINGS: Gallbladder: No gallstones or wall thickening visualized. No sonographic Murphy sign noted.   Common bile duct: Diameter: 3 mm.   Liver: Benign appearing cysts in the left lobe measures 1.1 cm. No solid mass. Normal echogenicity.   IVC: No abnormality visualized.   Pancreas: Visualized portion unremarkable.   Spleen: Size and appearance within normal limits.   Right Kidney: Length: 11.1 cm. Echogenicity within normal limits. No mass or hydronephrosis visualized.   Left Kidney: Length: 11.6 cm. Echogenicity within normal limits. No mass or hydronephrosis  visualized.   Abdominal aorta: Portions of the aorta were obscured. No visualized aneurysm.   Other findings: None.   IMPRESSION: No acute intra-abdominal pathology.     Electronically Signed   By: Maryclare Bean M.D.   On: 03/09/2014 09:17         ASSESSMENT/PLAN: 44 yo male with PMH of diverticulitis who is seen in consultation at the request of Dr. Anitra Lauth to evaluate early satiety, belching and dyspeptic symptoms.  1. Dyspepsia/eructation/loose stools -- the majority of his symptoms have resolved with PPI though he is still having frequent belching and intermittent loose stools. Celiac disease very unlikely with normal TTG. He had mild elevation in AST but a normal liver ultrasound. Repeat liver enzymes and viral hepatitis studies today. I expect they will normalize. I would like him to continue Prilosec 40 mg daily for one additional month and will give him rifaximin 550 mg 3 times a day for 14 days for IBS with gas and bloating. I would like to see him back in 6-8 weeks and  if symptoms have not completely abated would recommend upper endoscopy. We'll also check H. pylori antibody today but we discussed that the sensitivity of this test can lead to false negatives.  2. Elevated AST -- nonspecific, ALT was normal. Repeat liver enzymes today with viral hepatitis studies  3. History of diverticulitis -- remote and he did have a colonoscopy which excluded other pathology in 2009. Not currently an active issue

## 2014-03-23 NOTE — Patient Instructions (Addendum)
We are sending a prescription of Xifaxan to [facility].  Please continue omeprazole 40mg  daily for 1 month.  Please follow up in 6-8 weeks with Dr. Hilarie Fredrickson.  Your physician has requested that you go to the basement for lab work before leaving today.  PV:VZSMOL [name]

## 2014-03-28 ENCOUNTER — Telehealth: Payer: Self-pay | Admitting: *Deleted

## 2014-03-28 NOTE — Telephone Encounter (Signed)
Per [facility], patient's Xifaxan 550 mg tid x 14 days shows a "paid claim" through insurance. However, it will cost patient $1,290.00 even with paid claim. Please advise.Marland KitchenMarland Kitchen

## 2014-03-28 NOTE — Telephone Encounter (Signed)
Yes, flagyl 500 mg TID x 7 days, Bactrim DS BID x 7 days for SIBO Return thereafter

## 2014-03-28 NOTE — Telephone Encounter (Signed)
I don't know what that means.  Is this something we can ask his insurance or the drug company, or this is the final answer?

## 2014-03-28 NOTE — Telephone Encounter (Signed)
It is the "final answer." Insurance approved payment of the medication but they will only pay some on the medication but it will still cost patient $1,200 out of pocket. Do you want me to try flagyl instead?

## 2014-03-29 MED ORDER — SULFAMETHOXAZOLE-TRIMETHOPRIM 800-160 MG PO TABS: 1.0000 | ORAL_TABLET | Freq: Two times a day (BID) | ORAL | Status: DC

## 2014-03-29 MED ORDER — METRONIDAZOLE 500 MG PO TABS: 500.0000 mg | ORAL_TABLET | Freq: Three times a day (TID) | ORAL | Status: DC

## 2014-03-29 NOTE — Telephone Encounter (Signed)
I have spoken to patient to advise of Dr Vena Rua recommendations. He verbalizes understanding and will pick up flagyl and bactrim to take in place of xifaxan. He is advised to avoid alcohol while taking flagyl.

## 2014-05-06 ENCOUNTER — Encounter: Payer: Self-pay | Admitting: *Deleted

## 2014-05-19 ENCOUNTER — Ambulatory Visit: Payer: 59 | Admitting: Internal Medicine

## 2014-06-23 ENCOUNTER — Ambulatory Visit (INDEPENDENT_AMBULATORY_CARE_PROVIDER_SITE_OTHER): Payer: 59 | Admitting: Family Medicine

## 2014-06-23 ENCOUNTER — Encounter: Payer: Self-pay | Admitting: Family Medicine

## 2014-06-23 VITALS — BP 115/81 | HR 74 | Temp 99.0°F | Ht 72.5 in | Wt 231.0 lb

## 2014-06-23 DIAGNOSIS — Z23 Encounter for immunization: Secondary | ICD-10-CM | POA: Diagnosis not present

## 2014-06-23 DIAGNOSIS — J040 Acute laryngitis: Secondary | ICD-10-CM

## 2014-06-23 DIAGNOSIS — J018 Other acute sinusitis: Secondary | ICD-10-CM | POA: Diagnosis not present

## 2014-06-23 DIAGNOSIS — N342 Other urethritis: Secondary | ICD-10-CM

## 2014-06-23 MED ORDER — LEVOFLOXACIN 500 MG PO TABS: 500.0000 mg | ORAL_TABLET | Freq: Every day | ORAL | Status: DC

## 2014-06-23 NOTE — Progress Notes (Signed)
Pre visit review using our clinic review tool, if applicable. No additional management support is needed unless otherwise documented below in the visit note. 

## 2014-06-23 NOTE — Progress Notes (Signed)
OFFICE NOTE  06/23/2014  CC:  Chief Complaint  Patient presents with  . URI    sinus     HPI: Patient is a 44 y.o. Caucasian male who is here for resp sx's. Over 1 week of worsening nasal congestion/ST, PND, cough, mucous getting more plentiful and green/thicker, losing voice last 28h, mild HA.  NO fevers, no pain in face or upper teeth.  No SOB or wheezing.  Also c/o about 1 wk of mild/intermittent pain with urination, feels it near tip of penis specifically.  No anorectal region discomfort, no urinary urgency or increase in urinary frequency.  No penile d/c.  Denies penile d/c.  Denies personal hx of prostatitis or STD or cystitis.    Pertinent PMH:  Past medical, surgical, social, and family history reviewed and no changes are noted since last office visit.  MEDS:  As per HPI  PE: Blood pressure 115/81, pulse 74, temperature 99 F (37.2 C), temperature source Oral, height 6' 0.5" (1.842 m), weight 231 lb (104.781 kg), SpO2 97 %. VS: noted--normal. Gen: alert, NAD, NONTOXIC APPEARING. HEENT: eyes without injection, drainage, or swelling.  Ears: EACs clear, TMs with normal light reflex and landmarks.  Nose: Clear rhinorrhea, with some dried, crusty exudate adherent to mildly injected mucosa.  No purulent d/c.  No paranasal sinus TTP.  No facial swelling.  Throat and mouth without focal lesion.  No pharyngial swelling, erythema, or exudate.   Neck: supple, no LAD.   LUNGS: CTA bilat, nonlabored resps.   CV: RRR, no m/r/g. EXT: no c/c/e SKIN: no rash    IMPRESSION AND PLAN:  1) Acute sinusitis with laryngitis.  Prolonged/worsening, will treat as possible bacterial. Since pt also having vague urinary c/o (but defers GU/rectal exam today) will rx levaquin to get best coverage for sinusitis organisms as well as prostate/genitourinary organisms. Also, pt did want to go ahead and do urine GC/Chl to rule this out, even though he says he is married/monogamous, and denies hx of STD or  new sexual partner.  As a side note, pt requested Hep A vaccine b/c he says GI tested his ab level and it showed no immunity and he does travel overseas some for work: hep A #1 given today. He also has no immunity to hep B virus but he is low risk for this dz so we decided against vaccine for Hep B at this time.  An After Visit Summary was printed and given to the patient.  FOLLOW UP: prn

## 2014-06-27 ENCOUNTER — Encounter: Payer: Self-pay | Admitting: Family Medicine

## 2014-06-27 LAB — GC/CHLAMYDIA PROBE AMP, URINE
Chlamydia, Swab/Urine, PCR: NEGATIVE
GC Probe Amp, Urine: NEGATIVE

## 2014-06-27 NOTE — Telephone Encounter (Signed)
Pt aware.

## 2014-06-27 NOTE — Telephone Encounter (Signed)
STOP levaquin for 3d and have him call back after 3d and report how he's doing. Tell him his urine gonorrhea and chlamydia tests were negative.-thx

## 2014-06-28 ENCOUNTER — Other Ambulatory Visit (INDEPENDENT_AMBULATORY_CARE_PROVIDER_SITE_OTHER): Payer: 59

## 2014-06-28 ENCOUNTER — Other Ambulatory Visit: Payer: Self-pay | Admitting: Family Medicine

## 2014-06-28 DIAGNOSIS — R197 Diarrhea, unspecified: Secondary | ICD-10-CM | POA: Diagnosis not present

## 2014-06-29 LAB — GIARDIA/CRYPTOSPORIDIUM (EIA)
Cryptosporidium Screen (EIA): NEGATIVE
Giardia Screen (EIA): NEGATIVE

## 2014-06-29 LAB — CLOSTRIDIUM DIFFICILE BY PCR: Toxigenic C. Difficile by PCR: NOT DETECTED

## 2014-06-29 LAB — FECAL LACTOFERRIN, QUANT: LACTOFERRIN: POSITIVE

## 2014-07-02 LAB — STOOL CULTURE

## 2014-07-14 ENCOUNTER — Telehealth: Payer: Self-pay | Admitting: *Deleted

## 2014-07-14 MED ORDER — OMEPRAZOLE 40 MG PO CPDR: 40.0000 mg | DELAYED_RELEASE_CAPSULE | Freq: Every day | ORAL | Status: DC

## 2014-07-14 NOTE — Telephone Encounter (Signed)
OK to take this med: I eRx'd it to his pharmacy today.

## 2014-07-14 NOTE — Telephone Encounter (Signed)
Fax from [facility] Roy [name]) requesting refill for Omeprazole DR 40mg  take 1 cap daily. LOV 06/23/14, no up coming ov, last written: 03/04/14 w/3RF. This medication has been d/c in his chart but I spoke with pt today and he stated that he is still taking this medication. Please review. Thanks.

## 2014-07-27 ENCOUNTER — Other Ambulatory Visit: Payer: 59

## 2014-07-28 ENCOUNTER — Other Ambulatory Visit (INDEPENDENT_AMBULATORY_CARE_PROVIDER_SITE_OTHER): Payer: 59

## 2014-07-28 DIAGNOSIS — Z0000 Encounter for general adult medical examination without abnormal findings: Secondary | ICD-10-CM

## 2014-07-28 LAB — LIPID PANEL
Cholesterol: 235 mg/dL — ABNORMAL HIGH (ref 0–200)
HDL: 46.2 mg/dL (ref >39.00)
LDL Cholesterol: 161 mg/dL — ABNORMAL HIGH (ref 0–99)
NonHDL: 188.8
Total CHOL/HDL Ratio: 5
Triglycerides: 139 mg/dL (ref 0.0–149.0)
VLDL: 27.8 mg/dL (ref 0.0–40.0)

## 2014-07-28 LAB — COMPREHENSIVE METABOLIC PANEL
ALBUMIN: 4.2 g/dL (ref 3.5–5.2)
ALT: 13 U/L (ref 0–53)
AST: 16 U/L (ref 0–37)
Alkaline Phosphatase: 99 U/L (ref 39–117)
BILIRUBIN TOTAL: 0.7 mg/dL (ref 0.2–1.2)
BUN: 16 mg/dL (ref 6–23)
CO2: 31 mEq/L (ref 19–32)
CREATININE: 1.09 mg/dL (ref 0.40–1.50)
Calcium: 9.2 mg/dL (ref 8.4–10.5)
Chloride: 104 mEq/L (ref 96–112)
GFR: 78.26 mL/min (ref >60.00)
Glucose, Bld: 104 mg/dL — ABNORMAL HIGH (ref 70–99)
Potassium: 4.5 mEq/L (ref 3.5–5.1)
Sodium: 139 mEq/L (ref 135–145)
TOTAL PROTEIN: 7.2 g/dL (ref 6.0–8.3)

## 2014-07-28 LAB — CBC WITH DIFFERENTIAL/PLATELET
BASOS PCT: 0.6 % (ref 0.0–3.0)
Basophils Absolute: 0 10*3/uL (ref 0.0–0.1)
EOS ABS: 0.2 10*3/uL (ref 0.0–0.7)
EOS PCT: 4.2 % (ref 0.0–5.0)
HEMATOCRIT: 48.5 % (ref 39.0–52.0)
Hemoglobin: 16.3 g/dL (ref 13.0–17.0)
LYMPHS ABS: 1.3 10*3/uL (ref 0.7–4.0)
LYMPHS PCT: 27.2 % (ref 12.0–46.0)
MCHC: 33.6 g/dL (ref 30.0–36.0)
MCV: 95.8 fl (ref 78.0–100.0)
MONOS PCT: 9.8 % (ref 3.0–12.0)
Monocytes Absolute: 0.5 10*3/uL (ref 0.1–1.0)
NEUTROS ABS: 2.8 10*3/uL (ref 1.4–7.7)
Neutrophils Relative %: 58.2 % (ref 43.0–77.0)
Platelets: 138 10*3/uL — ABNORMAL LOW (ref 150.0–400.0)
RBC: 5.06 Mil/uL (ref 4.22–5.81)
RDW: 13.6 % (ref 11.5–15.5)
WBC: 4.8 10*3/uL (ref 4.0–10.5)

## 2014-07-28 LAB — TSH: TSH: 1.85 u[IU]/mL (ref 0.35–4.50)

## 2014-07-29 ENCOUNTER — Other Ambulatory Visit (INDEPENDENT_AMBULATORY_CARE_PROVIDER_SITE_OTHER): Payer: 59

## 2014-07-29 ENCOUNTER — Encounter: Payer: Self-pay | Admitting: Family Medicine

## 2014-07-29 ENCOUNTER — Other Ambulatory Visit: Payer: Self-pay | Admitting: Family Medicine

## 2014-07-29 ENCOUNTER — Ambulatory Visit (INDEPENDENT_AMBULATORY_CARE_PROVIDER_SITE_OTHER): Payer: 59 | Admitting: Family Medicine

## 2014-07-29 VITALS — BP 124/83 | HR 57 | Temp 97.9°F | Resp 16 | Ht 72.0 in | Wt 231.0 lb

## 2014-07-29 DIAGNOSIS — Z0000 Encounter for general adult medical examination without abnormal findings: Secondary | ICD-10-CM

## 2014-07-29 DIAGNOSIS — R7301 Impaired fasting glucose: Secondary | ICD-10-CM

## 2014-07-29 LAB — HEMOGLOBIN A1C: Hgb A1c MFr Bld: 5.6 % (ref 4.6–6.5)

## 2014-07-29 MED ORDER — MONTELUKAST SODIUM 10 MG PO TABS: 10.0000 mg | ORAL_TABLET | Freq: Every day | ORAL | Status: DC

## 2014-07-29 NOTE — Progress Notes (Signed)
Office Note 07/29/2014  CC:  Chief Complaint  Patient presents with  . Annual Exam    Pt is not fasting.     HPI:  Roy [name] is a 44 y.o. White male who is here for annual health maintenance exam. Reviewed recent fasting health panel labs in detail with pt: gluc 105 fasting (HbA1c pending), LDL 161 were the only abnormalities besides stable mild thrombocytopenia. Hasn't been exercising or eating as well the last 6 mo.  Has been feeling well lately, though.  Denies IBS sx's now. GERD well controlled with daily omeprazole 40mg .  Allergic rhinitis sx's still an issue on and off, currently no sx's to suggest URI/sinusitis. Allegra 180mg  qd prn of questionable benefit. He is unable to tolerate nasal sprays.  He has never had allergy testing. No wheezing, cough, or SOB.    Past Medical History  Diagnosis Date  . Diverticulosis 2009    Colonoscopy confirmed approx 2009 (diverticulitis x 2 episodes).  . Irritable bowel syndrome with diarrhea   . Colitis     Past Surgical History  Procedure Laterality Date  . Wrist fracture surgery Left 2010    Hockey injury, plate inserted  . Knee arthroscopy Bilateral     Bilat; 2003, 2004, 2010  . Shoulder arthroscopy Bilateral     Bilat; 1993, 2000, 2007    Family History  Problem Relation Age of Onset  . Pancreatic cancer Maternal Grandmother   . Colon cancer Paternal Grandmother   . Lung cancer Paternal Grandfather      (smoker)  . Colon polyps Father     History   Social History  . Marital Status: Married    Spouse Name: N/A  . Number of Children: 4  . Years of Education: N/A   Occupational History  . engineer    Social History Main Topics  . Smoking status: Never Smoker   . Smokeless tobacco: Never Used  . Alcohol Use: 0.0 oz/week    0 Standard drinks or equivalent per week     Comment: beer occasional  . Drug Use: No  . Sexual Activity: Not on file   Other Topics Concern  . Not on file   Social  History Narrative   Married, 4 children from ages 11 yrs to 96 yrs.  No sibs.   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   Grad from [employer] in [place], [place].   Originally from [place], lived in [place] for a while, then relocated to [facility] [place] [place] about 2010.   Exercises irregularly, no particular dietary focus.   No T/A/Ds.    Outpatient Prescriptions Prior to Visit  Medication Sig Dispense Refill  . fexofenadine (ALLEGRA) 180 MG tablet   10  . omeprazole (PRILOSEC) 40 MG capsule Take 1 capsule (40 mg total) by mouth daily. 30 capsule 10   No facility-administered medications prior to visit.    Allergies  Allergen Reactions  . Dayquil Multi-Symptom Cold-Flu [Pseudoephedrine-Apap-Dm] Hives   ROS Review of Systems  Constitutional: Negative for fever, chills, appetite change and fatigue.  HENT: Negative for congestion, dental problem, ear pain and sore throat.   Eyes: Negative for discharge, redness and visual disturbance.  Respiratory: Negative for cough, chest tightness, shortness of breath and wheezing.   Cardiovascular: Negative for chest pain, palpitations and leg swelling.  Gastrointestinal: Negative for nausea, vomiting, abdominal pain, diarrhea and blood in stool.  Genitourinary: Negative for dysuria, urgency, frequency, hematuria, flank pain and difficulty urinating.  Musculoskeletal: Negative for myalgias, back pain,  joint swelling, arthralgias and neck stiffness.  Skin: Negative for pallor and rash.  Neurological: Negative for dizziness, speech difficulty, weakness and headaches.  Hematological: Negative for adenopathy. Does not bruise/bleed easily.  Psychiatric/Behavioral: Negative for confusion and sleep disturbance. The patient is not nervous/anxious.     PE; Blood pressure 124/83, pulse 57, temperature 97.9 F (36.6 C), temperature source Oral, resp. rate 16, height 6' (1.829 m), weight 231 lb (104.781 kg), SpO2 98 %. Gen: Alert, well  appearing.  Patient is oriented to person, place, time, and situation. AFFECT: pleasant, lucid thought and speech. ENT: Ears: EACs clear, normal epithelium.  TMs with good light reflex and landmarks bilaterally.  Eyes: no injection, icteris, swelling, or exudate.  EOMI, PERRLA. Nose: no drainage or turbinate edema/swelling.  No injection or focal lesion.  Mouth: lips without lesion/swelling.  Oral mucosa pink and moist.  Dentition intact and without obvious caries or gingival swelling.  Oropharynx without erythema, exudate, or swelling.  Neck: supple/nontender.  No LAD, mass, or TM.  Carotid pulses 2+ bilaterally, without bruits. CV: RRR, no m/r/g.   LUNGS: CTA bilat, nonlabored resps, good aeration in all lung fields. ABD: soft, NT, ND, BS normal.  No hepatospenomegaly or mass.  No bruits. EXT: no clubbing, cyanosis, or edema.  Musculoskeletal: no joint swelling, erythema, warmth, or tenderness.  ROM of all joints intact. Skin - no sores or suspicious lesions or rashes or color changes   Pertinent labs:  Lab Results  Component Value Date   TSH 1.85 07/28/2014   Lab Results  Component Value Date   WBC 4.8 07/28/2014   HGB 16.3 07/28/2014   HCT 48.5 07/28/2014   MCV 95.8 07/28/2014   PLT 138.0* 07/28/2014   Lab Results  Component Value Date   CREATININE 1.09 07/28/2014   BUN 16 07/28/2014   NA 139 07/28/2014   K 4.5 07/28/2014   CL 104 07/28/2014   CO2 31 07/28/2014   Lab Results  Component Value Date   ALT 13 07/28/2014   AST 16 07/28/2014   ALKPHOS 99 07/28/2014   BILITOT 0.7 07/28/2014   Lab Results  Component Value Date   CHOL 235* 07/28/2014   Lab Results  Component Value Date   HDL 46.20 07/28/2014   Lab Results  Component Value Date   LDLCALC 161* 07/28/2014   Lab Results  Component Value Date   TRIG 139.0 07/28/2014   Lab Results  Component Value Date   CHOLHDL 5 07/28/2014    ASSESSMENT AND PLAN:   Health maintenance exam: Reviewed age and  gender appropriate health maintenance issues (prudent diet, regular exercise, health risks of tobacco and excessive alcohol, use of seatbelts, fire alarms in home, use of sunscreen).  Also reviewed age and gender appropriate health screening as well as vaccine recommendations. Filled out [employer] form for him today, no restrictions. Discussed diet/exercise for mild IFG and elevated LDL, will also f/u HbA1c ordered. Regarding his allergic rhinitis: start trial of singulair 10mg  qd, continue allegra 180 qd and call in 1-2 mo if pt desires referral to allergist.  An After Visit Summary was printed and given to the patient.  FOLLOW UP:  Return in about 1 year (around 07/29/2015) for annual CPE (fasting).

## 2014-07-29 NOTE — Progress Notes (Signed)
Pre visit review using our clinic review tool, if applicable. No additional management support is needed unless otherwise documented below in the visit note. 

## 2014-08-26 ENCOUNTER — Telehealth: Payer: Self-pay | Admitting: Family Medicine

## 2014-08-26 MED ORDER — METRONIDAZOLE 500 MG PO TABS: 500.0000 mg | ORAL_TABLET | Freq: Three times a day (TID) | ORAL | Status: DC

## 2014-08-26 MED ORDER — CIPROFLOXACIN HCL 750 MG PO TABS: ORAL_TABLET | ORAL | Status: DC

## 2014-08-26 NOTE — Telephone Encounter (Signed)
Pt advised and voiced understanding.   

## 2014-08-26 NOTE — Telephone Encounter (Signed)
Pt is having a flare of his diverticulitis and would like a rx for an antibiotic. He would like an antibiotic that will hit it hard and fast as he is leaving town in 5 days.  Please call and let him know.

## 2014-08-26 NOTE — Telephone Encounter (Signed)
Cipro and flagyl eRx'd.

## 2015-01-06 ENCOUNTER — Other Ambulatory Visit: Payer: Self-pay | Admitting: *Deleted

## 2015-01-06 MED ORDER — FEXOFENADINE HCL 180 MG PO TABS: 180.0000 mg | ORAL_TABLET | Freq: Every day | ORAL | Status: DC

## 2015-01-06 NOTE — Telephone Encounter (Signed)
RF request for fexofenadine LOV: 07/29/14 Next ov: None Last written: unknown

## 2015-01-10 ENCOUNTER — Encounter: Payer: Self-pay | Admitting: Family Medicine

## 2015-01-25 ENCOUNTER — Encounter: Payer: Self-pay | Admitting: Family Medicine

## 2015-01-25 ENCOUNTER — Ambulatory Visit (INDEPENDENT_AMBULATORY_CARE_PROVIDER_SITE_OTHER): Payer: 59 | Admitting: Family Medicine

## 2015-01-25 VITALS — BP 122/84 | HR 64 | Temp 98.2°F | Resp 16 | Ht 72.0 in | Wt 253.0 lb

## 2015-01-25 DIAGNOSIS — R5382 Chronic fatigue, unspecified: Secondary | ICD-10-CM

## 2015-01-25 DIAGNOSIS — G4710 Hypersomnia, unspecified: Secondary | ICD-10-CM

## 2015-01-25 DIAGNOSIS — R400 Somnolence: Secondary | ICD-10-CM

## 2015-01-25 DIAGNOSIS — H8110 Benign paroxysmal vertigo, unspecified ear: Secondary | ICD-10-CM

## 2015-01-25 DIAGNOSIS — R0683 Snoring: Secondary | ICD-10-CM

## 2015-01-25 DIAGNOSIS — J019 Acute sinusitis, unspecified: Secondary | ICD-10-CM

## 2015-01-25 LAB — CBC WITH DIFFERENTIAL/PLATELET
Basophils Absolute: 0 10*3/uL (ref 0.0–0.1)
Basophils Relative: 0.6 % (ref 0.0–3.0)
EOS ABS: 0.2 10*3/uL (ref 0.0–0.7)
Eosinophils Relative: 3.5 % (ref 0.0–5.0)
HEMATOCRIT: 48 % (ref 39.0–52.0)
HEMOGLOBIN: 16.1 g/dL (ref 13.0–17.0)
LYMPHS PCT: 29.5 % (ref 12.0–46.0)
Lymphs Abs: 1.5 10*3/uL (ref 0.7–4.0)
MCHC: 33.6 g/dL (ref 30.0–36.0)
MCV: 93.9 fl (ref 78.0–100.0)
MONOS PCT: 10.5 % (ref 3.0–12.0)
Monocytes Absolute: 0.5 10*3/uL (ref 0.1–1.0)
NEUTROS ABS: 2.8 10*3/uL (ref 1.4–7.7)
Neutrophils Relative %: 55.9 % (ref 43.0–77.0)
Platelets: 158 10*3/uL (ref 150.0–400.0)
RBC: 5.11 Mil/uL (ref 4.22–5.81)
RDW: 13.5 % (ref 11.5–15.5)
WBC: 5 10*3/uL (ref 4.0–10.5)

## 2015-01-25 LAB — COMPREHENSIVE METABOLIC PANEL
ALBUMIN: 4.2 g/dL (ref 3.5–5.2)
ALK PHOS: 84 U/L (ref 39–117)
ALT: 20 U/L (ref 0–53)
AST: 21 U/L (ref 0–37)
BUN: 14 mg/dL (ref 6–23)
CALCIUM: 9.4 mg/dL (ref 8.4–10.5)
CO2: 30 mEq/L (ref 19–32)
CREATININE: 1.18 mg/dL (ref 0.40–1.50)
Chloride: 106 mEq/L (ref 96–112)
GFR: 71.25 mL/min (ref >60.00)
Glucose, Bld: 100 mg/dL — ABNORMAL HIGH (ref 70–99)
Potassium: 5.2 mEq/L — ABNORMAL HIGH (ref 3.5–5.1)
SODIUM: 140 meq/L (ref 135–145)
TOTAL PROTEIN: 7.6 g/dL (ref 6.0–8.3)
Total Bilirubin: 0.7 mg/dL (ref 0.2–1.2)

## 2015-01-25 LAB — MAGNESIUM: MAGNESIUM: 2 mg/dL (ref 1.5–2.5)

## 2015-01-25 LAB — TSH: TSH: 2.79 u[IU]/mL (ref 0.35–4.50)

## 2015-01-25 LAB — VITAMIN B12: VITAMIN B 12: 313 pg/mL (ref 211–911)

## 2015-01-25 NOTE — Progress Notes (Signed)
Pre visit review using our clinic review tool, if applicable. No additional management support is needed unless otherwise documented below in the visit note. 

## 2015-01-25 NOTE — Progress Notes (Signed)
OFFICE VISIT  01/25/2015   CC:  Chief Complaint  Patient presents with  . Fatigue    awhile  . Dizziness    x 2 days   HPI:    Patient is a 44 y.o. Caucasian male who presents for recent episode 2 d/a of vertigo, onset after bending forward/leaning over to put on a pair of pants, +nausea with this.  He sat still and closed his eyes and says over the next 5 min the sx's resolved.  He has not had another episode of this but admits to a mild lightheaded feeling for some of the last day or so since it happened.  No hearing deficit or ringing in ears.  No fever. Some recent nasal/sinus congestion.  HA off and on.  No ST.  Occ PND cough.   Has never had episode of vertigo prior to this. No recent new meds.  Not working out lately, doesn't feel like he has as much energy as he used to lately--last few months at least, but worse last couple weeks.  No muscle aches or joint aches.  No BRBPR or melena.   Appetite normal. No depression.  Avg hours of sleep per night 6.5-7.  He snores.  Wife has not commented on any apneic type spells.  Feels rested "sometimes" upon awakening in am.  Remote hx of sleep study (approx 2009 in [place]) that pt says was NORMAL. He recounts for me how he is seemingly constantly bothered by "sinus" sx's: pressure and mild discomfort/pain in perinasal/periorbital region, nasal stuffiness, some PND, some sneezing.  Unclear if daily singulair and allegra help.  He has never done steroid nasal sprays for long periods.    Past Medical History  Diagnosis Date  . Diverticulosis 2009    Colonoscopy confirmed approx 2009 (diverticulitis x 2 episodes).  . Irritable bowel syndrome with diarrhea   . Colitis   . Thrombocytopenia ([place]) 2014    Mild; stable 2014-2016.  Abd u/s 02/2014 NORMAL.    Past Surgical History  Procedure Laterality Date  . Wrist fracture surgery Left 2010    Hockey injury, plate inserted  . Knee arthroscopy Bilateral     Bilat; 2003, 2004, 2010  .  Shoulder arthroscopy Bilateral     Bilat; 1993, 2000, 2007   MEDS: not taking cipro or flagyl listed below Outpatient Prescriptions Prior to Visit  Medication Sig Dispense Refill  . fexofenadine (ALLEGRA) 180 MG tablet Take 1 tablet (180 mg total) by mouth daily. 30 tablet 11  . montelukast (SINGULAIR) 10 MG tablet Take 1 tablet (10 mg total) by mouth at bedtime. 30 tablet 12  . omeprazole (PRILOSEC) 40 MG capsule Take 1 capsule (40 mg total) by mouth daily. 30 capsule 10  . ciprofloxacin (CIPRO) 750 MG tablet 1 tab po bid x 14d (Patient not taking: Reported on 01/25/2015) 28 tablet 0  . metroNIDAZOLE (FLAGYL) 500 MG tablet Take 1 tablet (500 mg total) by mouth 3 (three) times daily. (Patient not taking: Reported on 01/25/2015) 42 tablet 0   No facility-administered medications prior to visit.    Allergies  Allergen Reactions  . Dayquil Multi-Symptom Cold-Flu [Pseudoephedrine-Apap-Dm] Hives    ROS As per HPI  PE: Blood pressure 122/84, pulse 64, temperature 98.2 F (36.8 C), temperature source Oral, resp. rate 16, height 6' (1.829 m), weight 253 lb (114.76 kg), SpO2 96 %. Gen: Alert, well appearing.  Patient is oriented to person, place, time, and situation. ENT: Ears: EACs clear, normal epithelium.  TMs  with good light reflex and landmarks bilaterally.  Eyes: no injection, icteris, swelling, or exudate.  EOMI, PERRLA. Nose: no drainage or turbinate edema/swelling.  No injection or focal lesion.  Mouth: lips without lesion/swelling.  Oral mucosa pink and moist.  Dentition intact and without obvious caries or gingival swelling.  Oropharynx without erythema, exudate, or swelling.  Neck - No masses or thyromegaly or limitation in range of motion CV: RRR, no m/r/g.   LUNGS: CTA bilat, nonlabored resps, good aeration in all lung fields. ABD: soft, NT, ND, BS normal.  No hepatospenomegaly or mass.  No bruits. EXT: no clubbing, cyanosis, or edema.  Skin - no sores or suspicious lesions or  rashes or color changes Musculoskeletal: no joint swelling, erythema, warmth, or tenderness.  ROM of all joints intact. [place]: no vertigo or nystagmus elicited Neuro: CN [id number] intact bilaterally, strength 5/5 in proximal and distal upper extremities and lower extremities bilaterally.  No tremor.   No ataxia.     LABS:  Lab Results  Component Value Date   TSH 1.85 07/28/2014   Lab Results  Component Value Date   WBC 4.8 07/28/2014   HGB 16.3 07/28/2014   HCT 48.5 07/28/2014   MCV 95.8 07/28/2014   PLT 138.0* 07/28/2014   Lab Results  Component Value Date   CREATININE 1.09 07/28/2014   BUN 16 07/28/2014   NA 139 07/28/2014   K 4.5 07/28/2014   CL 104 07/28/2014   CO2 31 07/28/2014   Lab Results  Component Value Date   ALT 13 07/28/2014   AST 16 07/28/2014   ALKPHOS 99 07/28/2014   BILITOT 0.7 07/28/2014   Lab Results  Component Value Date   CHOL 235* 07/28/2014   Lab Results  Component Value Date   HDL 46.20 07/28/2014   Lab Results  Component Value Date   LDLCALC 161* 07/28/2014   Lab Results  Component Value Date   TRIG 139.0 07/28/2014   Lab Results  Component Value Date   CHOLHDL 5 07/28/2014   IMPRESSION AND PLAN:  1) Isolated episode of BPPV. No sign of alternate/competing dx at this time. Discussed dx, reassured pt, nothing to do but watchful waiting at this point in time.  2) Fatigue, nonspecific.  He describes some excessive daytime somnolence and he snores, but apparently no witnessed apneic spells in sleep.   Check CBC, CMET, magnesium, and TSH as well as vit B12 level. Refer to pulmonology for consideration of sleep study to eval for OSA.  3) Recurrent subacute rhinosinusitis: discussed options and pt was in favor of seeing an  ENT for further evaluation.  No changes in meds made today.  An After Visit Summary was printed and given to the patient.  FOLLOW UP: Return if symptoms worsen or fail to improve.

## 2015-02-01 ENCOUNTER — Encounter: Payer: Self-pay | Admitting: Family Medicine

## 2015-02-16 ENCOUNTER — Ambulatory Visit (INDEPENDENT_AMBULATORY_CARE_PROVIDER_SITE_OTHER): Payer: 59 | Admitting: Internal Medicine

## 2015-02-16 ENCOUNTER — Other Ambulatory Visit (INDEPENDENT_AMBULATORY_CARE_PROVIDER_SITE_OTHER): Payer: 59

## 2015-02-16 ENCOUNTER — Encounter: Payer: Self-pay | Admitting: Internal Medicine

## 2015-02-16 VITALS — BP 128/88 | HR 72 | Ht 72.0 in | Wt 253.2 lb

## 2015-02-16 DIAGNOSIS — J3089 Other allergic rhinitis: Secondary | ICD-10-CM

## 2015-02-16 DIAGNOSIS — G4733 Obstructive sleep apnea (adult) (pediatric): Secondary | ICD-10-CM

## 2015-02-16 DIAGNOSIS — J309 Allergic rhinitis, unspecified: Secondary | ICD-10-CM | POA: Insufficient documentation

## 2015-02-16 LAB — CBC WITH DIFFERENTIAL/PLATELET
Basophils Absolute: 0 10*3/uL (ref 0.0–0.1)
Basophils Relative: 0.6 % (ref 0.0–3.0)
EOS PCT: 3.2 % (ref 0.0–5.0)
Eosinophils Absolute: 0.1 10*3/uL (ref 0.0–0.7)
HEMATOCRIT: 48 % (ref 39.0–52.0)
HEMOGLOBIN: 16.1 g/dL (ref 13.0–17.0)
LYMPHS ABS: 1.3 10*3/uL (ref 0.7–4.0)
Lymphocytes Relative: 30.1 % (ref 12.0–46.0)
MCHC: 33.4 g/dL (ref 30.0–36.0)
MCV: 94.1 fl (ref 78.0–100.0)
MONOS PCT: 10.8 % (ref 3.0–12.0)
Monocytes Absolute: 0.5 10*3/uL (ref 0.1–1.0)
Neutro Abs: 2.5 10*3/uL (ref 1.4–7.7)
Neutrophils Relative %: 55.3 % (ref 43.0–77.0)
Platelets: 126 10*3/uL — ABNORMAL LOW (ref 150.0–400.0)
RBC: 5.11 Mil/uL (ref 4.22–5.81)
RDW: 13.7 % (ref 11.5–15.5)
WBC: 4.5 10*3/uL (ref 4.0–10.5)

## 2015-02-16 NOTE — Patient Instructions (Addendum)
Please see patient coordinator before you leave today  to schedule split night sleep study   Weight control is simply a matter of calorie balance which needs to be tilted in your favor by eating less and exercising more.  To get the most out of exercise, you need to be continuously aware that you are short of breath, but never out of breath, for 30 minutes daily. As you improve, it will actually be easier for you to do the same amount of exercise  in  30 minutes so always push to the level where you are short of breath.  If this does not result in gradual weight reduction then I strongly recommend you see a nutritionist with a food diary x 2 weeks so that we can work out a negative calorie balance which is universally effective in steady weight loss programs.  Think of your calorie balance like you do your bank account where in this case you want the balance to go down so you must take in less calories than you burn up.  It's just that simple:  Hard to do, but easy to understand.  Good luck!   Please remember to go to the lab department downstairs for your tests - we will call you with the results when they are available.

## 2015-02-16 NOTE — Assessment & Plan Note (Addendum)
Not responsive to nasal steroids / singulair > reviewed approp rx and ordered allergy profile

## 2015-02-16 NOTE — Progress Notes (Signed)
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 SLEEP CONSULT    Referred by Dr. Ernestine Conrad. Pt c/o fatigue, daytime somnolence and snoring. Pt reports a 25+lb weight gain in the past 6 months. Pt has previously had a sleep study around 10 yrs ago that was essentially normal.     Sleep Questionnaire:  What time do you typically go to bed?   49mn   How long does it take you to fall asleep?  1 m  How many times during the night do you wake up?   2 What time do you get out of bed to start your day?  7a Do you drive or operate heavy machinery in your occupation?   no  How much has your weight changed (up or down) over the past two years?   Up 20  Have you ever had a sleep study before?   yes    If yes, location of study?   [place]   If yes, date of study?   10 y prior to OV    Do you currently use CPAP?   No, did not qualify If so, what pressure?   n/a   Do you wear oxygen at any time?  no Epworth score   14   Wakes up feeling more  tired and wife says snoring more since wt gain / no problem driving short distances   No obvious day to day or daytime variability or assoc chronic cough or cp or chest tightness, subjective wheeze or overt sinus or hb symptoms. No unusual exp hx or h/o childhood pna/ asthma or knowledge of premature birth.  Sleeping ok without nocturnal  or early am exacerbation  of respiratory  c/o's or need for noct saba. Also denies any obvious fluctuation of symptoms with weather or environmental changes or other aggravating or alleviating factors except as outlined above   Current Medications, Allergies,  Complete Past Medical History, Past Surgical History, Family History, and Social History were reviewed in [employer] record.  ROS  The following are not active complaints unless bolded sore throat, dysphagia, dental problems, itching, sneezing,  nasal congestion no better on flonase/singulair/ f/u [facility] or excess/ purulent secretions, ear ache,   fever, chills, sweats, unintended wt loss, classically pleuritic or exertional cp, hemoptysis,  orthopnea pnd or leg swelling, presyncope, palpitations, abdominal pain, anorexia, nausea, vomiting, diarrhea  or change in bowel or bladder habits, change in stools or urine, dysuria,hematuria,  rash, arthralgias, visual complaints, headache, numbness, weakness or ataxia or problems with walking or coordination,  change in mood/affect or memory.         Review of Systems     Objective:   Physical Exam    amb wm nad   Wt Readings from Last 3 Encounters:  02/16/15 253 lb 3.2 oz (114.851 kg)  01/25/15 253 lb (114.76 kg)  07/29/14 231 lb (104.781 kg)    Vital signs reviewed   HEENT: nl dentition, turbinates, and oropharynx. Nl external ear canals without cough reflex Modified Mallampati Score = 1/2     NECK :  without JVD/Nodes/TM/ nl carotid upstrokes bilaterally   LUNGS: no acc muscle use,  Nl contour chest which is clear to A and P bilaterally without cough on insp or exp maneuvers   CV:  RRR  no s3 or murmur or increase in P2, no edema   ABD:  soft and nontender with nl inspiratory excursion in the supine position. No bruits or organomegaly, bowel sounds nl  MS:  Nl gait/ ext warm without deformities, calf tenderness, cyanosis or clubbing No obvious joint restrictions   SKIN: warm and dry without lesions    NEURO:  alert, approp, nl sensorium with  no motor deficits    Lab Results  Component Value Date   TSH 2.79 01/25/2015        Assessment:

## 2015-02-16 NOTE — Assessment & Plan Note (Addendum)
Clinically has sign hypersomnolence with snoring and has gained sign wt since last study so suspect sign osa   rec split night study and start cpap  Until set up with sleep specialist / work on wt (see sep a/p)  Total time devoted to counseling  =25/40m review case with pt/ discussion of options/alternatives/ giving and going over instructions (see avs)

## 2015-02-16 NOTE — Assessment & Plan Note (Addendum)
Complicated by prob osa/ gerd   Body mass index is 34.33    Lab Results  Component Value Date   TSH 2.79 01/25/2015     Contributing to gerd tendency/ doe/reviewed the need and the process to achieve and maintain neg calorie balance > defer f/u primary care including intermittently monitoring thyroid status

## 2015-02-17 LAB — ALLERGY FULL PROFILE
ALLERGEN, D PTERNOYSSINUS, D1: 2.77 kU/L — AB
Alternaria Alternata: <0.1 kU/L
BERMUDA GRASS: 0.57 kU/L — AB
Bahia Grass: 1.09 kU/L — ABNORMAL HIGH
Box Elder IgE: <0.1 kU/L
Candida Albicans: 0.48 kU/L — ABNORMAL HIGH
Cat Dander: <0.1 kU/L
Curvularia lunata: <0.1 kU/L
D. FARINAE: 4.6 kU/L — AB
Dog Dander: <0.1 kU/L
Elm IgE: 0.13 kU/L — ABNORMAL HIGH
Fescue: 2.93 kU/L — ABNORMAL HIGH
G005 RYE, PERENNIAL: 2.75 kU/L — AB
G009 Red Top: 3.08 kU/L — ABNORMAL HIGH
HOUSE DUST HOLLISTER: 0.32 kU/L — AB
Helminthosporium halodes: <0.1 kU/L
IgE (Immunoglobulin E), Serum: 198 kU/L — ABNORMAL HIGH (ref <115)
Lamb's Quarters: 0.12 kU/L — ABNORMAL HIGH
Plantain: <0.1 kU/L
TIMOTHY GRASS: 1.85 kU/L — AB

## 2015-02-23 NOTE — Progress Notes (Signed)
Quick Note:  Spoke with pt and notified of results per Dr. Wert. Pt verbalized understanding and denied any questions.  ______ 

## 2015-03-07 ENCOUNTER — Ambulatory Visit: Payer: 59 | Admitting: Internal Medicine

## 2015-03-09 ENCOUNTER — Ambulatory Visit (INDEPENDENT_AMBULATORY_CARE_PROVIDER_SITE_OTHER): Payer: 59 | Admitting: Internal Medicine

## 2015-03-09 ENCOUNTER — Encounter: Payer: Self-pay | Admitting: Internal Medicine

## 2015-03-09 VITALS — BP 112/76 | HR 73 | Ht 73.0 in | Wt 252.0 lb

## 2015-03-09 DIAGNOSIS — G4733 Obstructive sleep apnea (adult) (pediatric): Secondary | ICD-10-CM

## 2015-03-09 DIAGNOSIS — J3089 Other allergic rhinitis: Secondary | ICD-10-CM

## 2015-03-09 MED ORDER — AZELASTINE-FLUTICASONE 137-50 MCG/ACT NA SUSP: 1.0000 | Freq: Two times a day (BID) | NASAL | Status: DC

## 2015-03-09 MED ORDER — AZELASTINE-FLUTICASONE 137-50 MCG/ACT NA SUSP: NASAL | Status: DC

## 2015-03-09 NOTE — Assessment & Plan Note (Addendum)
Allergy profile 02/16/15 >  Eos 0.1 IgE 198  Grass/ trees/ dust  - 03/09/2015 trial of dysmista and allergy referral prn   Discussed avoidance issues as well

## 2015-03-09 NOTE — Progress Notes (Signed)
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 SLEEP CONSULT    Referred by Dr. Ernestine Conrad. Pt c/o fatigue, daytime somnolence and snoring. Pt reports a 25+lb weight gain in the past 6 months. Pt has previously had a sleep study around 10 yrs ago that was essentially normal.     Sleep Questionnaire:  What time do you typically go to bed?   6mn   How long does it take you to fall asleep?  1 m  How many times during the night do you wake up?   2 What time do you get out of bed to start your day?  7a Do you drive or operate heavy machinery in your occupation?   no  How much has your weight changed (up or down) over the past two years?   Up 20  Have you ever had a sleep study before?   yes    If yes, location of study?   [place]   If yes, date of study?   10 y prior to OV    Do you currently use CPAP?   No, did not qualify If so, what pressure?   n/a   Do you wear oxygen at any time?  no Epworth score   14  Wakes up feeling more  tired and wife says snoring more since wt gain / no problem driving short distances  rec Sleep study Allergy profile >  Pos grass/dust  IgE  198    03/09/2015  f/u ov/Chett Taniguchi re: allergic rhinitis poor control on flonase with freq sinus infection  Chief Complaint  Patient presents with  . Follow-up    Pt states here to review labs. He only c/o minimal nasal congestion.   maint rx = flonase and singulair   No obvious day to day or daytime variability or assoc chronic cough or cp or chest tightness, subjective wheeze  or overt   hb symptoms. No unusual exp hx or h/o childhood pna/ asthma or knowledge of premature birth.  Sleeping ok without nocturnal  or early am exacerbation  of respiratory  c/o's or need for noct saba. Also denies any obvious fluctuation of symptoms with weather or environmental changes or other aggravating or alleviating factors except as outlined above   Current Medications, Allergies, Complete Past Medical History, Past Surgical History, Family History, and Social History were reviewed in [employer] record.  ROS  The following are not active complaints unless bolded sore throat, dysphagia, dental problems, itching, sneezing,  nasal congestion no better on flonase/singulair/ f/u [facility] planned  or excess/ purulent secretions, ear ache,   fever, chills, sweats, unintended wt loss, classically pleuritic or exertional cp, hemoptysis,  orthopnea pnd or leg swelling, presyncope, palpitations, abdominal pain, anorexia, nausea, vomiting, diarrhea  or change in bowel or bladder habits, change in stools or urine, dysuria,hematuria,  rash, arthralgias, visual complaints, headache, numbness, weakness or ataxia or problems with walking or coordination,  change in mood/affect or memory.  Objective:   Physical Exam    amb wm nad   03/09/2015        252   02/16/15 253 lb 3.2 oz (114.851 kg)  01/25/15 253 lb (114.76 kg)  07/29/14 231 lb (104.781 kg)    Vital signs reviewed   HEENT: nl dentition,   and oropharynx. Nl external ear canals without cough reflex - moderate bilateral non-specific turbinate edema  Modified Mallampati Score = 1/2     NECK :  without JVD/Nodes/TM/ nl carotid upstrokes bilaterally   LUNGS: no acc muscle use,  Nl contour chest which is clear to A and P bilaterally without cough on insp or exp maneuvers   CV:  RRR  no s3 or murmur or increase in P2, no edema   ABD:  soft and nontender with nl inspiratory excursion in the supine  position. No bruits or organomegaly, bowel sounds nl  MS:  Nl gait/ ext warm without deformities, calf tenderness, cyanosis or clubbing No obvious joint restrictions   SKIN: warm and dry without lesions    NEURO:  alert, approp, nl sensorium with  no motor deficits    Lab Results  Component Value Date   TSH 2.79 01/25/2015        Assessment:

## 2015-03-09 NOTE — Patient Instructions (Signed)
Try dymista one twice daily   If you need an allergist I recommend Dr Bruna Potter group strongly.

## 2015-03-12 NOTE — Assessment & Plan Note (Signed)
Split night study pending > will refer to sleep medicine if Pos

## 2015-03-30 ENCOUNTER — Encounter: Payer: Self-pay | Admitting: Family Medicine

## 2015-04-05 ENCOUNTER — Encounter (HOSPITAL_BASED_OUTPATIENT_CLINIC_OR_DEPARTMENT_OTHER): Payer: 59

## 2015-04-06 ENCOUNTER — Other Ambulatory Visit: Payer: Self-pay | Admitting: Otolaryngology

## 2015-04-10 ENCOUNTER — Other Ambulatory Visit (HOSPITAL_COMMUNITY): Payer: 59

## 2015-04-10 ENCOUNTER — Encounter (HOSPITAL_COMMUNITY): Payer: Self-pay

## 2015-04-10 ENCOUNTER — Encounter (HOSPITAL_COMMUNITY)
Admission: RE | Admit: 2015-04-10 | Discharge: 2015-04-10 | Disposition: A | Discharge status: Home / Self Care | Payer: 59 | Source: Ambulatory Visit | Attending: Otolaryngology | Admitting: Otolaryngology

## 2015-04-10 DIAGNOSIS — Z01812 Encounter for preprocedural laboratory examination: Secondary | ICD-10-CM | POA: Diagnosis present

## 2015-04-10 DIAGNOSIS — J342 Deviated nasal septum: Secondary | ICD-10-CM | POA: Diagnosis not present

## 2015-04-10 HISTORY — DX: Gastro-esophageal reflux disease without esophagitis: K21.9

## 2015-04-10 LAB — CBC
HEMATOCRIT: 44.9 % (ref 39.0–52.0)
Hemoglobin: 15.7 g/dL (ref 13.0–17.0)
MCH: 32.3 pg (ref 26.0–34.0)
MCHC: 35 g/dL (ref 30.0–36.0)
MCV: 92.4 fL (ref 78.0–100.0)
Platelets: 138 10*3/uL — ABNORMAL LOW (ref 150–400)
RBC: 4.86 MIL/uL (ref 4.22–5.81)
RDW: 12.5 % (ref 11.5–15.5)
WBC: 3.7 10*3/uL — ABNORMAL LOW (ref 4.0–10.5)

## 2015-04-10 LAB — BASIC METABOLIC PANEL
Anion gap: 8 (ref 5–15)
BUN: 15 mg/dL (ref 6–20)
CHLORIDE: 107 mmol/L (ref 101–111)
CO2: 26 mmol/L (ref 22–32)
Calcium: 9.2 mg/dL (ref 8.9–10.3)
Creatinine, Ser: 1.15 mg/dL (ref 0.61–1.24)
GFR calc Af Amer: >60 mL/min (ref >60)
GLUCOSE: 114 mg/dL — AB (ref 65–99)
POTASSIUM: 4.5 mmol/L (ref 3.5–5.1)
Sodium: 141 mmol/L (ref 135–145)

## 2015-04-10 NOTE — Pre-Procedure Instructions (Signed)
Roy [name]  04/10/2015      [id number] - [place], [place] - [address] [place] [address] Phone: [phone] Fax: [phone]    Your procedure is scheduled on 04/14/2015  Report to [facility] Admitting at 5:30 A.M.  Call this number if you have problems the morning of surgery:  [phone]   Remember:  Do not eat food or drink liquids after midnight.  On Thursday night    Take these medicines the morning of surgery with A SIP OF WATER : Prilosec, Allegra   Do not wear jewelry   Do not wear lotions, powders, or perfumes.  You may wear deodorant.   Do not shave 48 hours prior to surgery.  Men may shave face and neck.   Do not bring valuables to the hospital.   [facility] is not responsible for any belongings or valuables.  Contacts, dentures or bridgework may not be worn into surgery.  Leave your suitcase in the car.  After surgery it may be brought to your room.  For patients admitted to the hospital, discharge time will be determined by your treatment team.  Patients discharged the day of surgery will not be allowed to drive home.   Name and phone number of your driver:   With wife Special instructions:  Special Instructions: [facility] - Preparing for Surgery  Before surgery, you can play an important role.  Because skin is not sterile, your skin needs to be as free of germs as possible.  You can reduce the number of germs on you skin by washing with CHG (chlorahexidine gluconate) soap before surgery.  CHG is an antiseptic cleaner which kills germs and bonds with the skin to continue killing germs even after washing.  Please DO NOT use if you have an allergy to CHG or antibacterial soaps.  If your skin becomes reddened/irritated stop using the CHG and inform your nurse when you arrive at Short Stay.  Do not shave (including legs and underarms) for at least 48 hours prior  to the first CHG shower.  You may shave your face.  Please follow these instructions carefully:   1.  Shower with CHG Soap the night before surgery and the  morning of Surgery.  2.  If you choose to wash your hair, wash your hair first as usual with your  normal shampoo.  3.  After you shampoo, rinse your hair and body thoroughly to remove the  Shampoo.  4.  Use CHG as you would any other liquid soap.  You can apply chg directly to the skin and wash gently with scrungie or a clean washcloth.  5.  Apply the CHG Soap to your body ONLY FROM THE NECK DOWN.    Do not use on open wounds or open sores.  Avoid contact with your eyes, ears, mouth and genitals (private parts).  Wash genitals (private parts)   with your normal soap.  6.  Wash thoroughly, paying special attention to the area where your surgery will be performed.  7.  Thoroughly rinse your body with warm water from the neck down.  8.  DO NOT shower/wash with your normal soap after using and rinsing off   the CHG Soap.  9.  Pat yourself dry with a clean towel.            10.  Wear clean pajamas.  11.  Place clean sheets on your bed the night of your first shower and do not sleep with pets.  Day of Surgery  Do not apply any lotions/deodorants the morning of surgery.  Please wear clean clothes to the hospital/surgery center.  Please read over the following fact sheets that you were given. Pain Booklet, Coughing and Deep Breathing and Surgical Site Infection Prevention

## 2015-04-13 MED ORDER — DEXAMETHASONE SODIUM PHOSPHATE 10 MG/ML IJ SOLN
10.0000 mg | Freq: Once | INTRAMUSCULAR | Status: AC
  Administered 2015-04-14: 10 mg via INTRAVENOUS
  Filled 2015-04-13: qty 1

## 2015-04-13 MED ORDER — CEFAZOLIN SODIUM-DEXTROSE 2-3 GM-% IV SOLR
2.0000 g | INTRAVENOUS | Status: AC
  Administered 2015-04-14: 2 g via INTRAVENOUS
  Filled 2015-04-13: qty 50

## 2015-04-14 ENCOUNTER — Encounter (HOSPITAL_COMMUNITY)
Admission: RE | Disposition: A | Discharge status: Home / Self Care | Payer: Self-pay | Source: Ambulatory Visit | Attending: Otolaryngology

## 2015-04-14 ENCOUNTER — Ambulatory Visit (HOSPITAL_COMMUNITY)
Admission: RE | Admit: 2015-04-14 | Discharge: 2015-04-14 | Disposition: A | Discharge status: Home / Self Care | Payer: 59 | Source: Ambulatory Visit | Attending: Otolaryngology | Admitting: Otolaryngology

## 2015-04-14 ENCOUNTER — Encounter (HOSPITAL_COMMUNITY): Payer: Self-pay | Admitting: Certified Registered Nurse Anesthetist

## 2015-04-14 ENCOUNTER — Ambulatory Visit (HOSPITAL_COMMUNITY): Payer: 59 | Admitting: Certified Registered Nurse Anesthetist

## 2015-04-14 DIAGNOSIS — K589 Irritable bowel syndrome without diarrhea: Secondary | ICD-10-CM | POA: Insufficient documentation

## 2015-04-14 DIAGNOSIS — J988 Other specified respiratory disorders: Secondary | ICD-10-CM | POA: Insufficient documentation

## 2015-04-14 DIAGNOSIS — J342 Deviated nasal septum: Secondary | ICD-10-CM | POA: Diagnosis present

## 2015-04-14 DIAGNOSIS — K219 Gastro-esophageal reflux disease without esophagitis: Secondary | ICD-10-CM | POA: Insufficient documentation

## 2015-04-14 DIAGNOSIS — G4730 Sleep apnea, unspecified: Secondary | ICD-10-CM | POA: Diagnosis not present

## 2015-04-14 DIAGNOSIS — J310 Chronic rhinitis: Secondary | ICD-10-CM | POA: Insufficient documentation

## 2015-04-14 DIAGNOSIS — J343 Hypertrophy of nasal turbinates: Secondary | ICD-10-CM | POA: Diagnosis not present

## 2015-04-14 DIAGNOSIS — Z79899 Other long term (current) drug therapy: Secondary | ICD-10-CM | POA: Insufficient documentation

## 2015-04-14 DIAGNOSIS — E669 Obesity, unspecified: Secondary | ICD-10-CM | POA: Diagnosis not present

## 2015-04-14 HISTORY — PX: NASAL SEPTOPLASTY W/ TURBINOPLASTY: SHX2070

## 2015-04-14 SURGERY — SEPTOPLASTY, NOSE, WITH NASAL TURBINATE REDUCTION
Anesthesia: General | Laterality: Bilateral

## 2015-04-14 MED ORDER — MUPIROCIN CALCIUM 2 % EX CREA
TOPICAL_CREAM | CUTANEOUS | Status: AC
  Filled 2015-04-14: qty 15

## 2015-04-14 MED ORDER — 0.9 % SODIUM CHLORIDE (POUR BTL) OPTIME
TOPICAL | Status: DC | PRN
  Administered 2015-04-14: 1000 mL

## 2015-04-14 MED ORDER — PROMETHAZINE HCL 25 MG/ML IJ SOLN: 6.2500 mg | INTRAMUSCULAR | Status: DC | PRN

## 2015-04-14 MED ORDER — PROPOFOL 10 MG/ML IV BOLUS
INTRAVENOUS | Status: AC
  Filled 2015-04-14: qty 20

## 2015-04-14 MED ORDER — MIDAZOLAM HCL 5 MG/5ML IJ SOLN
INTRAMUSCULAR | Status: DC | PRN
  Administered 2015-04-14: 2 mg via INTRAVENOUS

## 2015-04-14 MED ORDER — LIDOCAINE HCL (CARDIAC) 20 MG/ML IV SOLN
INTRAVENOUS | Status: AC
  Filled 2015-04-14: qty 5

## 2015-04-14 MED ORDER — LIDOCAINE HCL (CARDIAC) 20 MG/ML IV SOLN
INTRAVENOUS | Status: DC | PRN
  Administered 2015-04-14: 80 mg via INTRAVENOUS

## 2015-04-14 MED ORDER — OXYMETAZOLINE HCL 0.05 % NA SOLN
NASAL | Status: DC | PRN
  Administered 2015-04-14: 1

## 2015-04-14 MED ORDER — PROPOFOL 10 MG/ML IV BOLUS
INTRAVENOUS | Status: DC | PRN
  Administered 2015-04-14: 200 mg via INTRAVENOUS

## 2015-04-14 MED ORDER — LACTATED RINGERS IV SOLN
INTRAVENOUS | Status: DC | PRN
  Administered 2015-04-14: 07:00:00 via INTRAVENOUS

## 2015-04-14 MED ORDER — ROCURONIUM BROMIDE 100 MG/10ML IV SOLN
INTRAVENOUS | Status: DC | PRN
  Administered 2015-04-14: 50 mg via INTRAVENOUS

## 2015-04-14 MED ORDER — GLYCOPYRROLATE 0.2 MG/ML IJ SOLN
INTRAMUSCULAR | Status: DC | PRN
  Administered 2015-04-14: 0.6 mg via INTRAVENOUS

## 2015-04-14 MED ORDER — ROCURONIUM BROMIDE 50 MG/5ML IV SOLN
INTRAVENOUS | Status: AC
  Filled 2015-04-14: qty 1

## 2015-04-14 MED ORDER — LIDOCAINE-EPINEPHRINE 1 %-1:100000 IJ SOLN
INTRAMUSCULAR | Status: AC
  Filled 2015-04-14: qty 1

## 2015-04-14 MED ORDER — LIDOCAINE-EPINEPHRINE 1 %-1:100000 IJ SOLN
INTRAMUSCULAR | Status: DC | PRN
  Administered 2015-04-14: 7 mL

## 2015-04-14 MED ORDER — SODIUM CHLORIDE 0.9 % IJ SOLN
INTRAMUSCULAR | Status: AC
  Filled 2015-04-14: qty 10

## 2015-04-14 MED ORDER — OXYMETAZOLINE HCL 0.05 % NA SOLN
NASAL | Status: AC
  Filled 2015-04-14: qty 15

## 2015-04-14 MED ORDER — SUCCINYLCHOLINE CHLORIDE 20 MG/ML IJ SOLN
INTRAMUSCULAR | Status: AC
  Filled 2015-04-14: qty 1

## 2015-04-14 MED ORDER — FENTANYL CITRATE (PF) 250 MCG/5ML IJ SOLN
INTRAMUSCULAR | Status: AC
  Filled 2015-04-14: qty 5

## 2015-04-14 MED ORDER — NEOSTIGMINE METHYLSULFATE 10 MG/10ML IV SOLN
INTRAVENOUS | Status: DC | PRN
  Administered 2015-04-14: 4 mg via INTRAVENOUS

## 2015-04-14 MED ORDER — EPHEDRINE SULFATE 50 MG/ML IJ SOLN
INTRAMUSCULAR | Status: AC
  Filled 2015-04-14: qty 1

## 2015-04-14 MED ORDER — ONDANSETRON HCL 4 MG/2ML IJ SOLN
INTRAMUSCULAR | Status: DC | PRN
  Administered 2015-04-14: 4 mg via INTRAVENOUS

## 2015-04-14 MED ORDER — HYDROCODONE-ACETAMINOPHEN 5-325 MG PO TABS: 1.0000 | ORAL_TABLET | Freq: Four times a day (QID) | ORAL | Status: DC | PRN

## 2015-04-14 MED ORDER — MIDAZOLAM HCL 2 MG/2ML IJ SOLN
INTRAMUSCULAR | Status: AC
  Filled 2015-04-14: qty 2

## 2015-04-14 MED ORDER — HYDROMORPHONE HCL 1 MG/ML IJ SOLN: 0.2500 mg | INTRAMUSCULAR | Status: DC | PRN

## 2015-04-14 MED ORDER — FENTANYL CITRATE (PF) 100 MCG/2ML IJ SOLN
INTRAMUSCULAR | Status: DC | PRN
  Administered 2015-04-14: 100 ug via INTRAVENOUS

## 2015-04-14 MED ORDER — MUPIROCIN CALCIUM 2 % EX CREA
TOPICAL_CREAM | CUTANEOUS | Status: DC | PRN
  Administered 2015-04-14: 1 via TOPICAL

## 2015-04-14 MED ORDER — AMOXICILLIN-POT CLAVULANATE 500-125 MG PO TABS: 1.0000 | ORAL_TABLET | Freq: Two times a day (BID) | ORAL | Status: DC

## 2015-04-14 SURGICAL SUPPLY — 26 items
BLADE SURG 15 STRL LF DISP TIS (BLADE) ×1 IMPLANT
BLADE SURG 15 STRL SS (BLADE) ×2
CANISTER SUCTION 2500CC (MISCELLANEOUS) ×3 IMPLANT
COAGULATOR SUCT 8FR VV (MISCELLANEOUS) IMPLANT
DRAPE PROXIMA HALF (DRAPES) IMPLANT
ELECT REM PT RETURN 9FT ADLT (ELECTROSURGICAL)
ELECTRODE REM PT RTRN 9FT ADLT (ELECTROSURGICAL) IMPLANT
GAUZE SPONGE 2X2 8PLY STRL LF (GAUZE/BANDAGES/DRESSINGS) ×1 IMPLANT
GLOVE BIOGEL M 7.0 STRL (GLOVE) ×6 IMPLANT
GOWN STRL REUS W/ TWL LRG LVL3 (GOWN DISPOSABLE) ×2 IMPLANT
GOWN STRL REUS W/TWL LRG LVL3 (GOWN DISPOSABLE) ×4
KIT BASIN OR (CUSTOM PROCEDURE TRAY) ×3 IMPLANT
KIT ROOM TURNOVER OR (KITS) ×3 IMPLANT
NEEDLE HYPO 25GX1X1/2 BEV (NEEDLE) ×3 IMPLANT
NS IRRIG 1000ML POUR BTL (IV SOLUTION) ×3 IMPLANT
PAD ARMBOARD 7.5X6 YLW CONV (MISCELLANEOUS) ×3 IMPLANT
SPLINT NASAL DOYLE BI-VL (GAUZE/BANDAGES/DRESSINGS) ×3 IMPLANT
SPONGE GAUZE 2X2 STER 10/PKG (GAUZE/BANDAGES/DRESSINGS) ×2
SPONGE NEURO XRAY DETECT 1X3 (DISPOSABLE) ×3 IMPLANT
SUT ETHILON 3 0 PS 1 (SUTURE) ×3 IMPLANT
SUT PLAIN 4 0 ~~LOC~~ 1 (SUTURE) ×3 IMPLANT
SYR BULB 3OZ (MISCELLANEOUS) ×3 IMPLANT
TOWEL OR 17X24 6PK STRL BLUE (TOWEL DISPOSABLE) ×3 IMPLANT
TRAY ENT MC OR (CUSTOM PROCEDURE TRAY) ×3 IMPLANT
TUBE SALEM SUMP 16 FR W/ARV (TUBING) ×3 IMPLANT
TUBING EXTENTION W/L.L. (IV SETS) ×3 IMPLANT

## 2015-04-14 NOTE — Op Note (Signed)
Roy [name], Roy [name]        ACCOUNT NO.:  [id number]  MEDICAL RECORD NO.:  [id number]  LOCATION:  [facility]                         FACILITY:  [place]  PHYSICIAN:  Early Chars. Wilburn Cornelia, M.D.DATE OF BIRTH:  May 16, 1970  DATE OF PROCEDURE:  04/14/2015 DATE OF DISCHARGE:                              OPERATIVE REPORT   PREOPERATIVE DIAGNOSES: 1. Progressive nasal airway obstruction. 2. Deviated nasal septum. 3. Inferior turbinate hypertrophy. 4. Possible obstructive sleep apnea.  POSTOPERATIVE DIAGNOSES: 1. Progressive nasal airway obstruction. 2. Deviated nasal septum. 3. Inferior turbinate hypertrophy. 4. Possible obstructive sleep apnea.  INDICATION FOR SURGERY: 1. Progressive nasal airway obstruction. 2. Deviated nasal septum. 3. Inferior turbinate hypertrophy. 4. Possible obstructive sleep apnea.  PROCEDURE: 1. Nasal septoplasty. 2. Bilateral inferior turbinate reduction.  ANESTHESIA:  General endotracheal.  COMPLICATIONS:  None.  ESTIMATED BLOOD LOSS:  Less than 50 mL.  DISPOSITION:  The patient transferred from the operating room to the recovery room in stable condition.  BRIEF HISTORY:  The patient is a healthy 45 year old white male with a history of progressive symptoms of nasal airway obstruction.  He suffered previous nasal trauma and over the last several years noted increasing difficulty with nasal breathing.  He had a history of nighttime snoring and mild intermittent obstruction consistent with possible sleep apnea.  Examination showed a severely deviated nasal septum and bilateral turbinate hypertrophy.  Given the patient's history and findings, I recommended the above surgical procedures.  The risks and benefits were discussed in detail and the patient understood and agreed with our plan for surgery which is scheduled on elective basis as an outpatient at [facility].  DESCRIPTION OF PROCEDURE:  The patient was brought to the operating  room on April 14, 2015, and placed in supine position on the operating table.  General endotracheal anesthesia was established without difficulty.  When the patient was adequately anesthetized, he was positioned and prepped and draped.  A surgical time-out was then performed with correct identification of the patient and the surgical procedure.  His nose was then injected with a total of 7 mL of 1% lidocaine 1:100,000 solution of epinephrine injected in submucosal fashion along the nasal septum and inferior turbinates.  Total of 7 mL injected.  The patient's nose was then packed with Afrin-soaked cottonoid pledgets, were left in place for approximately 10 minutes to allow for vasoconstriction and hemostasis.  With the patient prepped and draped for surgery, a left anterior hemitransfixion incision was created and mucoperichondrial flap was elevated on the left.  The anterior cartilaginous septum was crossed and a mucoperichondrial flap elevated on the right.  Anterior septal cartilage was removed.  This was later morselized and returned to the mucoperichondrial pocket.  The patient had very large bony septal spur along the maxillary crest.  This was mobilized using a 4-mm osteotome preserving the overlying mucosa.  Dissection was then carried from anterior to posterior resecting deviated bone and cartilage and bringing the septum to a good midline position.  The morselized cartilage was returned to the mucoperichondrial pocket and the flaps reapproximated with a 4-0 gut suture on a Keith needle in a horizontal mattress fashion.  The hemitransfixion incision was closed with the same stitch and  bilateral Doyle nasal septal splints were placed after the application of Bactroban ointment and suture positioned with a 3-0 Ethilon suture.  Inferior turbinate reduction was then performed with cautery set at 12 watts.  Two submucosal passes were made in each inferior turbinate.   The turbinates were then outfractured creating more patent nasal passageway.  Nasal cavity was irrigated and suctioned.  There was no active bleeding. Orogastric tube passed.  Stomach contents aspirated.  The patient was awakened from his anesthetic, extubated, and transferred from the operating room to the recovery room in stable condition.  There were no complications and blood loss was less than 50 mL.          ______________________________ Early Chars. Wilburn Cornelia, M.D.     DLS/MEDQ  D:  [id number]  T:  04/14/2015  Job:  [id number]

## 2015-04-14 NOTE — Transfer of Care (Signed)
Immediate Anesthesia Transfer of Care Note  Patient: Roy [name]  Procedure(s) Performed: Procedure(s): NASAL SEPTOPLASTY WITH BILATERAL INFERIOR  TURBINATE REDUCTION (Bilateral)  Patient Location: PACU  Anesthesia Type:General  Level of Consciousness: awake, alert  and oriented  Airway & Oxygen Therapy: Patient Spontanous Breathing and Patient connected to face mask oxygen  Post-op Assessment: Report given to RN, Post -op Vital signs reviewed and stable and Patient moving all extremities X 4  Post vital signs: Reviewed and stable  Last Vitals:  Filed Vitals:   04/14/15 0638  BP: 133/78  Pulse: 69  Temp: 37 C  Resp: 18    Complications: No apparent anesthesia complications

## 2015-04-14 NOTE — Brief Op Note (Signed)
04/14/2015  8:34 AM  PATIENT:  Roy [name]  45 y.o. male  PRE-OPERATIVE DIAGNOSIS:  DEVIATED NASAL SEPTUM  POST-OPERATIVE DIAGNOSIS:  DEVIATED NASAL SEPTUM  PROCEDURE:  Procedure(s): NASAL SEPTOPLASTY WITH BILATERAL INFERIOR  TURBINATE REDUCTION (Bilateral)  SURGEON:  Surgeon(s) and Role:    * Jerrell Belfast, MD - Primary  PHYSICIAN ASSISTANT:   ASSISTANTS: none   ANESTHESIA:   general  EBL:  Total I/O In: 700 [I.V.:700] Out: 50 [Blood:50]  BLOOD ADMINISTERED:none  DRAINS: none   LOCAL MEDICATIONS USED:  LIDOCAINE  and Amount: 7 ml  SPECIMEN:  No Specimen  DISPOSITION OF SPECIMEN:  N/A  COUNTS:  YES  TOURNIQUET:  * No tourniquets in log *  DICTATION: .Other Dictation: Dictation Number [id number]  PLAN OF CARE: Discharge to home after PACU  PATIENT DISPOSITION:  PACU - hemodynamically stable.   Delay start of Pharmacological VTE agent (>24hrs) due to surgical blood loss or risk of bleeding: not applicable

## 2015-04-14 NOTE — H&P (Signed)
Roy [name] is an 45 y.o. male.   Chief Complaint: Nasal obstruction HPI: Prog. Deviated septum and nasal obstruction.  Past Medical History  Diagnosis Date  . Diverticulosis 2009    Colonoscopy confirmed approx 2009 (diverticulitis x 2 episodes).  . Irritable bowel syndrome with diarrhea   . Colitis   . Thrombocytopenia ([place]) 2014    Mild; stable 2014-2016.  Abd u/s 02/2014 NORMAL.  [name] Chronic rhinitis     Deviated nasal septum, hypertrophy of nasal turbinates: Dr. Wilburn Cornelia to do inferior nasal turb reduction and septoplasty as of 03/28/15.  [name] Snoring     Sleep study in remote past was normal per pt report.  . Sleep concern     study done from a bout 2007- in [place]   . GERD (gastroesophageal reflux disease)     Past Surgical History  Procedure Laterality Date  . Wrist fracture surgery Left 2010    Hockey injury, plate inserted  . Knee arthroscopy Bilateral     Bilat; 2003, 2004, 2010  . Shoulder arthroscopy Bilateral     Bilat; 1993, 2000, 2007  . Eye surgery      lasik  . Wisdom tooth extraction      young adult    Family History  Problem Relation Age of Onset  . Pancreatic cancer Maternal Grandmother   . Colon cancer Paternal Grandmother   . Lung cancer Paternal Grandfather      (smoker)  . Colon polyps Father    Social History:  reports that he has never smoked. He has never used smokeless tobacco. He reports that he drinks alcohol. He reports that he does not use illicit drugs.  Allergies:  Allergies  Allergen Reactions  . Dayquil Multi-Symptom Cold-Flu [Pseudoephedrine-Apap-Dm] Hives    Medications Prior to Admission  Medication Sig Dispense Refill  . fexofenadine (ALLEGRA) 180 MG tablet Take 1 tablet (180 mg total) by mouth daily. 30 tablet 11  . mometasone (NASONEX) 50 MCG/ACT nasal spray Place 2 sprays into the nose every evening.    . montelukast (SINGULAIR) 10 MG tablet Take 1 tablet (10 mg total) by mouth at bedtime. 30 tablet 12  .  Multiple Vitamins-Minerals (MULTIVITAMIN WITH MINERALS) tablet Take 1 tablet by mouth daily.    [name] omeprazole (PRILOSEC) 40 MG capsule Take 1 capsule (40 mg total) by mouth daily. (Patient taking differently: Take 40 mg by mouth daily before breakfast. ) 30 capsule 10    No results found for this or any previous visit (from the past 48 hour(s)). No results found.  Review of Systems  Constitutional: Negative.   HENT: Positive for congestion.   Respiratory: Negative.   Cardiovascular: Negative.   Gastrointestinal: Negative.     Blood pressure 133/78, pulse 69, temperature 98.6 F (37 C), temperature source Oral, resp. rate 18, weight 113.853 kg (251 lb), SpO2 98 %. Physical Exam  Constitutional: He is oriented to person, place, and time. He appears well-developed and well-nourished.  HENT:  Deviated septum  Neck: Normal range of motion. Neck supple.  Cardiovascular: Normal rate.   Respiratory: Effort normal.  GI: Soft.  Musculoskeletal: Normal range of motion.  Neurological: He is alert and oriented to person, place, and time.     Assessment/Plan Adm for septoplasty and IT reduction.  Roy Crotty, MD 04/14/2015, 7:27 AM

## 2015-04-14 NOTE — Anesthesia Preprocedure Evaluation (Addendum)
Anesthesia Evaluation  Patient identified by MRN, date of birth, ID band Patient awake    Reviewed: Allergy & Precautions, NPO status , Patient's Chart, lab work & pertinent test results  History of Anesthesia Complications Negative for: history of anesthetic complications  Airway Mallampati: I  TM Distance: >3 FB Neck ROM: Full    Dental  (+) Dental Advisory Given, Teeth Intact   Pulmonary sleep apnea ,    breath sounds clear to auscultation       Cardiovascular  Rhythm:Regular Rate:Normal     Neuro/Psych    GI/Hepatic Neg liver ROS, GERD  Medicated,  Endo/Other  negative endocrine ROS  Renal/GU negative Renal ROS     Musculoskeletal   Abdominal (+) - obese, scaphoid   Peds  Hematology   Anesthesia Other Findings   Reproductive/Obstetrics                          Anesthesia Physical Anesthesia Plan  ASA: II  Anesthesia Plan: General   Post-op Pain Management:    Induction: Intravenous  Airway Management Planned: Oral ETT  Additional Equipment:   Intra-op Plan:   Post-operative Plan: Extubation in OR  Informed Consent: I have reviewed the patients History and Physical, chart, labs and discussed the procedure including the risks, benefits and alternatives for the proposed anesthesia with the patient or authorized representative who has indicated his/her understanding and acceptance.   Dental advisory given  Plan Discussed with: CRNA, Anesthesiologist and Surgeon  Anesthesia Plan Comments:         Anesthesia Quick Evaluation

## 2015-04-14 NOTE — Anesthesia Procedure Notes (Signed)
Procedure Name: Intubation Date/Time: 04/14/2015 7:45 AM Performed by: Garrison Columbus T Pre-anesthesia Checklist: Patient identified, Emergency Drugs available, Suction available and Patient being monitored Patient Re-evaluated:Patient Re-evaluated prior to inductionOxygen Delivery Method: Circle system utilized Preoxygenation: Pre-oxygenation with 100% oxygen Intubation Type: IV induction Ventilation: Mask ventilation without difficulty and Oral airway inserted - appropriate to patient size Laryngoscope Size: Sabra Heck and 2 Grade View: Grade I Tube type: Oral Tube size: 7.5 mm Number of attempts: 1 Airway Equipment and Method: Stylet and Oral airway Placement Confirmation: ETT inserted through vocal cords under direct vision,  positive ETCO2 and breath sounds checked- equal and bilateral Secured at: 23 cm Tube secured with: Tape Dental Injury: Teeth and Oropharynx as per pre-operative assessment

## 2015-04-15 NOTE — Anesthesia Postprocedure Evaluation (Signed)
Anesthesia Post Note  Patient: Water quality scientist  Procedure(s) Performed: Procedure(s) (LRB): NASAL SEPTOPLASTY WITH BILATERAL INFERIOR  TURBINATE REDUCTION (Bilateral)  Patient location during evaluation: PACU Anesthesia Type: General Level of consciousness: awake Pain management: pain level controlled Vital Signs Assessment: post-procedure vital signs reviewed and stable Respiratory status: spontaneous breathing Cardiovascular status: stable Postop Assessment: no signs of nausea or vomiting Anesthetic complications: no    Last Vitals:  Filed Vitals:   04/14/15 0934 04/14/15 0958  BP:  124/98  Pulse:  65  Temp: 36.2 C   Resp:      Last Pain:  Filed Vitals:   04/14/15 0958  PainSc: 0-No pain                 Annett Boxwell

## 2015-04-16 ENCOUNTER — Encounter: Payer: Self-pay | Admitting: Family Medicine

## 2015-04-17 ENCOUNTER — Encounter (HOSPITAL_COMMUNITY): Payer: Self-pay | Admitting: Otolaryngology

## 2015-05-25 ENCOUNTER — Telehealth: Payer: Self-pay | Admitting: Family Medicine

## 2015-05-25 NOTE — Telephone Encounter (Signed)
I would wait a full 2 weeks before hiking on it.

## 2015-05-25 NOTE — Telephone Encounter (Signed)
Pt advised and voiced understanding.  Apt made for 06/12/15 at 2:00pm.

## 2015-05-25 NOTE — Telephone Encounter (Signed)
Please advise. Thanks.  

## 2015-05-25 NOTE — Telephone Encounter (Signed)
Patient wants to know if Dr. Anitra Lauth removes part of toenail how long it would be before he could hike on it, he has a big [employer] hike coming up. He is having a lot of pain around the "inside of the L big toe". No pain while it's in a shoe but if something bumps it he hurts. Patient will make an appointment just not sure when to make it.

## 2015-06-06 ENCOUNTER — Other Ambulatory Visit: Payer: Self-pay | Admitting: *Deleted

## 2015-06-06 MED ORDER — OMEPRAZOLE 40 MG PO CPDR: 40.0000 mg | DELAYED_RELEASE_CAPSULE | Freq: Every day | ORAL | Status: DC

## 2015-06-06 NOTE — Telephone Encounter (Signed)
RF request for omeprazole LOV: 07/29/14 Next ov: 06/12/15 Last written: 07/14/14 #30 w/ 10

## 2015-06-12 ENCOUNTER — Encounter: Payer: Self-pay | Admitting: Family Medicine

## 2015-06-12 ENCOUNTER — Ambulatory Visit (INDEPENDENT_AMBULATORY_CARE_PROVIDER_SITE_OTHER): Payer: 59 | Admitting: Family Medicine

## 2015-06-12 VITALS — BP 118/81 | HR 65 | Temp 98.0°F | Resp 16 | Ht 73.0 in | Wt 249.2 lb

## 2015-06-12 DIAGNOSIS — L600 Ingrowing nail: Secondary | ICD-10-CM | POA: Diagnosis not present

## 2015-06-12 NOTE — Progress Notes (Signed)
Pre visit review using our clinic review tool, if applicable. No additional management support is needed unless otherwise documented below in the visit note. 

## 2015-06-12 NOTE — Progress Notes (Signed)
OFFICE NOTE  06/12/2015  CC:  Chief Complaint  Patient presents with  . Toe Pain    Left great toe     HPI: Patient is a 45 y.o. Caucasian male who is here for left great toe pain. Going on for >1 yr, very sensitive in-grown portion of the nail medially.  No swelling or redness. He tends to clumsily knock his foot into things a lot, triggering the pain intensely. He desires partial toenail removal today.   Pertinent PMH:  Past medical, surgical, social, and family history reviewed and no changes are noted since last office visit.  MEDS:  Allegra 180mg  qd, omeprazole 40mg  qd  PE: Blood pressure 118/81, pulse 65, temperature 98 F (36.7 C), temperature source Oral, resp. rate 16, height 6\' 1"  (1.854 m), weight 249 lb 4 oz (113.059 kg), SpO2 95 %. Gen: Alert, well appearing.  Patient is oriented to person, place, time, and situation. Left great toe with ingrown nail medially>laterally.  Mildly TTP with firm pressure at medial nail border but lateral nail border w/out any tenderness.  No erythema, swelling, or discharge.  IMPRESSION AND PLAN:  Ingrown L great toenail.  Pt desires partial toenail removal today.  No infection.  PROCEDURE: left great toenail removal: After local anesthesia with 2% lidocaine without epinephrine, I used sterile technique with nail splitters and hemostats to remove the medial 1/3 of the nail.  No immediate complications.  Pt tolerated procedure well.  Wound care discussed.  FOLLOW UP: prn  Signed:  Crissie Sickles, MD           06/12/2015

## 2015-06-16 ENCOUNTER — Telehealth: Payer: Self-pay | Admitting: Family Medicine

## 2015-06-16 NOTE — Telephone Encounter (Addendum)
Please call patient, his PCP is not in the office today. I received a request to call in a prescription to [place] for a presumed diverticulitis flare since patient is out of town. I am not familiar with the patient, and it doesn't seem like he has been seen for diverticulitis in quite some time, last prescription was called in when he was out of town as well. He should be seen by a physician in order to be evaluated to be certain this is treated appropriately.

## 2015-06-16 NOTE — Telephone Encounter (Signed)
Pt is out of town and having a diverticulitis flare. He would like to know if you would call a [facility] [place] at  [phone] this morning.

## 2015-06-16 NOTE — Telephone Encounter (Signed)
Patient called back and wants the rx sent to [facility] [phone]

## 2015-06-16 NOTE — Telephone Encounter (Signed)
Dr. Anitra Lauth pt.   Please advised. Thanks.

## 2015-06-16 NOTE — Telephone Encounter (Signed)
Pt advised and voiced understanding.   

## 2015-06-16 NOTE — Telephone Encounter (Signed)
Please advise. Thanks.  

## 2015-06-17 ENCOUNTER — Ambulatory Visit (INDEPENDENT_AMBULATORY_CARE_PROVIDER_SITE_OTHER): Payer: 59

## 2015-06-17 ENCOUNTER — Encounter (INDEPENDENT_AMBULATORY_CARE_PROVIDER_SITE_OTHER): Payer: Self-pay

## 2015-06-17 VITALS — BP 131/87 | HR 94 | Temp 98.7°F | Resp 16 | Ht 73.0 in | Wt 246.9 lb

## 2015-06-17 DIAGNOSIS — K5792 Diverticulitis of intestine, part unspecified, without perforation or abscess without bleeding: Secondary | ICD-10-CM

## 2015-06-17 MED ORDER — METRONIDAZOLE 500 MG TABLET
500.00 mg | ORAL_TABLET | Freq: Two times a day (BID) | ORAL | 0 refills | Status: AC
Start: 2015-06-17 — End: 2015-06-24

## 2015-06-17 MED ORDER — CIPROFLOXACIN 500 MG TABLET
500.00 mg | ORAL_TABLET | Freq: Two times a day (BID) | ORAL | 0 refills | Status: AC
Start: 2015-06-17 — End: 2015-06-24

## 2015-06-17 NOTE — Patient Instructions (Signed)
Diverticulitis  Diverticulitis is inflammation or infection of small pouches in your colon that form when you have a condition called diverticulosis. The pouches in your colon are called diverticula. Your colon, or large intestine, is where water is absorbed and stool is formed.  Complications of diverticulitis can include:  · Bleeding.  · Severe infection.  · Severe pain.  · Perforation of your colon.  · Obstruction of your colon.  CAUSES   Diverticulitis is caused by bacteria.  Diverticulitis happens when stool becomes trapped in diverticula. This allows bacteria to grow in the diverticula, which can lead to inflammation and infection.  RISK FACTORS  People with diverticulosis are at risk for diverticulitis. Eating a diet that does not include enough fiber from fruits and vegetables may make diverticulitis more likely to develop.  SYMPTOMS   Symptoms of diverticulitis may include:  · Abdominal pain and tenderness. The pain is normally located on the left side of the abdomen, but may occur in other areas.  · Fever and chills.  · Bloating.  · Cramping.  · Nausea.  · Vomiting.  · Constipation.  · Diarrhea.  · Blood in your stool.  DIAGNOSIS   Your health care provider will ask you about your medical history and do a physical exam. You may need to have tests done because many medical conditions can cause the same symptoms as diverticulitis. Tests may include:  · Blood tests.  · Urine tests.  · Imaging tests of the abdomen, including X-rays and CT scans.  When your condition is under control, your health care provider may recommend that you have a colonoscopy. A colonoscopy can show how severe your diverticula are and whether something else is causing your symptoms.  TREATMENT   Most cases of diverticulitis are mild and can be treated at home. Treatment may include:  · Taking over-the-counter pain medicines.  · Following a clear liquid diet.  · Taking antibiotic medicines by mouth for 7-10 days.  More severe cases may  be treated at a hospital. Treatment may include:  · Not eating or drinking.  · Taking prescription pain medicine.  · Receiving antibiotic medicines through an IV tube.  · Receiving fluids and nutrition through an IV tube.  · Surgery.  HOME CARE INSTRUCTIONS   · Follow your health care provider's instructions carefully.  · Follow a full liquid diet or other diet as directed by your health care provider. After your symptoms improve, your health care provider may tell you to change your diet. He or she may recommend you eat a high-fiber diet. Fruits and vegetables are good sources of fiber. Fiber makes it easier to pass stool.  · Take fiber supplements or probiotics as directed by your health care provider.  · Only take medicines as directed by your health care provider.  · Keep all your follow-up appointments.  SEEK MEDICAL CARE IF:   · Your pain does not improve.  · You have a hard time eating food.  · Your bowel movements do not return to normal.  SEEK IMMEDIATE MEDICAL CARE IF:   · Your pain becomes worse.  · Your symptoms do not get better.  · Your symptoms suddenly get worse.  · You have a fever.  · You have repeated vomiting.  · You have bloody or black, tarry stools.  MAKE SURE YOU:   · Understand these instructions.  · Will watch your condition.  · Will get help right away if you are not doing well   or get worse.     This information is not intended to replace advice given to you by your health care provider. Make sure you discuss any questions you have with your health care provider.     Document Released: 11/21/2004 Document Revised: 02/16/2013 Document Reviewed: 01/06/2013  Elsevier Interactive Patient Education 2016 [employer]     [facility]      Operated by [facility]  [address] [place]  [place], [place] [id number]  Phone: [phone]  Fax: [phone]  [email or web address]  Open Daily 8:00a - 8:00p    Closed Thanksgiving and Christmas Day  [facility]     Operated by [facility]  [address]  [facility]  [place], [place] [id number]  Phone: [phone]  Fax: [phone]  [email or web address]  Open M-F  8:00a - 8:00p  Sat              10:00a - 4:00p  Sun             Closed  Closed all [place] Holidays        Attending Caregiver: Verta EllenBeth Ann Ziyad Dyar, DNP, FNP      Today's orders:   Orders Placed This Encounter    ciprofloxacin HCl (CIPRO) 500 mg Oral Tablet    metroNIDAZOLE (FLAGYL) 500 mg Oral Tablet        Prescription(s) E-Rx to:  [id number] [place], [place] - [address]    ________________________________________________________________________  Short Term Disability and Family Medical Leave Act  [facility] does NOT provide assistance with any disability applications.  If you feel your medical condition requires you to be on disability, you will need to follow up with  your primary care physician or a specialist.  We apologize for any inconvenience.    For Medication Prescribed by [facility]:  As an Urgent Care facility, our clinic does NOT offer prescription refills over the telephone.    If you need more of the medication one of our medical providers prescribed, you will  either need to be re-evaluated by us or see your primary care physician.    ________________________________________________________________________      It is very important that we have a phone number.  This is the single best way to contact you in the event that we become aware of important clinical information or concerns after your discharge.  If the phone number you provided at registration is NOT this number you should inform staff and registration prior to leaving.      Your treatment and evaluation today was focused on identifying and treating potentially emergent conditions based on your presenting signs, symptoms, and history.  The resulting initial clinical impression and treatment plan is not  intended to be definitive or a substitute for a full physical examination and evaluation by your primary care provider.  If your symptoms persist, worsen, or you develop any new or concerning symptoms, you need to be evaluated.      If you received x-rays during your visit, be aware that the final and formal interpretation of those films by a radiologist may occur after your discharge.  If there is a significant discrepancy identified after your discharge, we will contact you at the telephone number provided at registration.      If you received a pelvic exam, you may have cultures pending for sexually transmitted diseases.  Positive cultures are reported to the [facility] Department of  Health as required by state law.  You may contact the Health Information Management Office of [facility] to get a copy of your results.     If you are over 55 year old, we cannot discuss your personal health information with a parent, spouse, family member, or anyone else without your consent.  This does not include those who have legitimate access to your records and information to assist in your care under the provisions of HIPAA [facility] Portability and Accountability Act) law, or those to whom you have previously given written consent to do so, such a legal guardian or Power of [place].      Instructions are discussed with patient upon discharge by clinical staff with all questions answered.  Please call [facility] [phone]) if any further questions develop.  Go immediately to the emergency department if any concerns or worsening symptoms.      Verta Ellen, DNP, FNP 06/17/2015, 09:49

## 2015-06-17 NOTE — Progress Notes (Addendum)
History of Present Illness: Richard Maynard is a 45 y.o. male who presents to the Urgent Care today with chief complaint of Diverticulitis (Hx of diverticulitis- having exacerbation)     Location: LLQ, lower abdomen  Quality: cramping pain "in gut"   Onset: three days  Severity: mild to moderate  Timing: intermittent  Context: states that he has a h/o diverticulosis; states that he had a colonoscopy in 2010, and states that his last flare was last June 2016.  States that this flare is very similar to prior flares, with the exception of not being as severe as the first flare; states that he is from [place], and is up here for alumni weekend; states that he tried to get antibiotics from his PCP but he wasn't available  Treatment tried: sodium citrate  Alleviating factors: sodium citrate did "make stools more loose"  Aggravating factors: persistent abdominal discomfort  Pertinent Negatives: afebrile, no SOB, tolerating PO intake  Associated symptoms: LLQ and lower abdominal discomfort    I reviewed and confirmed the patient's past medical history taken by the nurse or medical assistant with the addition of the following:    Past Medical History:    Past Medical History:   Diagnosis Date    Diverticulitis     Environmental allergies     Sinus problem          Past Surgical History:    Past Surgical History:   Procedure Laterality Date    COLONOSCOPY      HX KNEE ARTHROSCOPY      HX SEPTOPLASTY      HX SHOULDER ARTHROSCOPY      HX WRIST FRACTURE TX           Allergies:  Allergies   Allergen Reactions    Dayquil Allergy 12-Hr [Brompheniramine-Ppa] Hives/ Urticaria     Medications:    Current Outpatient Prescriptions   Medication Sig    ciprofloxacin HCl (CIPRO) 500 mg Oral Tablet Take 1 Tab (500 mg total) by mouth Twice daily for 7 days    fexofenadine (ALLEGRA) 60 mg Oral Tablet Take 60 mg by mouth Twice daily    metroNIDAZOLE (FLAGYL) 500 mg Oral Tablet Take 1 Tab (500 mg total) by mouth Twice daily for 7  days    omeprazole (PRILOSEC) 20 mg Oral Capsule, Delayed Release(E.C.) Take 20 mg by mouth Once a day     Social History:    Social History   Substance Use Topics    Smoking status: Never Smoker    Smokeless tobacco: None    Alcohol use Yes      Comment: occasionally     Family History: No significant family history.  No family history on file.      Review of Systems:  General: no fever  Pulmonary:   no dry cough, no productive cough, no wheezing and no SOB  Cardiovascular:  no chest pain and no palpitations  Gastrointestinal:  no nausea, no vomiting, diarrhea and abdominal pain    Physical Exam:  Vital signs:   Vitals:    06/17/15 0916   BP: 131/87   Pulse: 94   Resp: 16   Temp: 37.1 C (98.7 F)   TempSrc: Tympanic   SpO2: 96%   Weight: 112 kg (246 lb 14.6 oz)   Height: 1.854 m ( )     General:  Well appearing and No acute distress  Pulmonary:  clear to auscultation bilaterally, no wheezes, no rales and no rhonchi  Cardiovascular:  regular rate/rhythm and normal S1/S2  Gastrointestinal:  Non-distended, normal bowel sounds, soft, no rebound and LLQ and lower central abdominal discomfort with palpation    Data Reviewed  Not applicable    Course: Condition at discharge: Good     Differential Diagnosis: diverticulitis vs constipation vs gastroenteritis    Assessment:   1. Diverticulitis        Plan:    Orders Placed This Encounter    ciprofloxacin HCl (CIPRO) 500 mg Oral Tablet    metroNIDAZOLE (FLAGYL) 500 mg Oral Tablet     Known h/o diverticulosis; discussed dietary modifications and medications as directed.    Plan was discussed and patient/parent/guardian verbalized understanding.  If symptoms are not improving the patient should return to the Urgent Care for further evaluation. If symptoms are worsening or emergent present to Emergency Department for  higher level of evaluation and care.  Supervising physician was physically present in Urgent Care and available for consultation and did not participate  in the care of this patient.   Verta EllenBeth Ann Beck, DNP, FNP 06/17/2015, 09:51

## 2015-08-04 ENCOUNTER — Ambulatory Visit (INDEPENDENT_AMBULATORY_CARE_PROVIDER_SITE_OTHER): Payer: 59 | Admitting: Family Medicine

## 2015-08-04 ENCOUNTER — Encounter: Payer: Self-pay | Admitting: Family Medicine

## 2015-08-04 VITALS — BP 119/87 | HR 73 | Temp 97.9°F | Resp 20 | Ht 73.0 in | Wt 237.0 lb

## 2015-08-04 DIAGNOSIS — Z114 Encounter for screening for human immunodeficiency virus [HIV]: Secondary | ICD-10-CM

## 2015-08-04 DIAGNOSIS — Z130 Encounter for screening for diseases of the blood and blood-forming organs and certain disorders involving the immune mechanism: Secondary | ICD-10-CM

## 2015-08-04 DIAGNOSIS — Z6831 Body mass index (BMI) 31.0-31.9, adult: Secondary | ICD-10-CM | POA: Diagnosis not present

## 2015-08-04 DIAGNOSIS — K5790 Diverticulosis of intestine, part unspecified, without perforation or abscess without bleeding: Secondary | ICD-10-CM | POA: Diagnosis not present

## 2015-08-04 DIAGNOSIS — Z125 Encounter for screening for malignant neoplasm of prostate: Secondary | ICD-10-CM

## 2015-08-04 DIAGNOSIS — Z0000 Encounter for general adult medical examination without abnormal findings: Secondary | ICD-10-CM

## 2015-08-04 LAB — LIPID PANEL
CHOLESTEROL: 256 mg/dL — AB (ref 0–200)
HDL: 37.9 mg/dL — ABNORMAL LOW (ref >39.00)
LDL CALC: 203 mg/dL — AB (ref 0–99)
NonHDL: 218.53
TRIGLYCERIDES: 76 mg/dL (ref 0.0–149.0)
Total CHOL/HDL Ratio: 7
VLDL: 15.2 mg/dL (ref 0.0–40.0)

## 2015-08-04 LAB — CBC WITH DIFFERENTIAL/PLATELET
BASOS PCT: 0.5 % (ref 0.0–3.0)
Basophils Absolute: 0 10*3/uL (ref 0.0–0.1)
EOS ABS: 0.1 10*3/uL (ref 0.0–0.7)
EOS PCT: 2.7 % (ref 0.0–5.0)
HEMATOCRIT: 47 % (ref 39.0–52.0)
HEMOGLOBIN: 16 g/dL (ref 13.0–17.0)
Lymphocytes Relative: 26.4 % (ref 12.0–46.0)
Lymphs Abs: 1.2 10*3/uL (ref 0.7–4.0)
MCHC: 34 g/dL (ref 30.0–36.0)
MCV: 92.8 fl (ref 78.0–100.0)
MONO ABS: 0.5 10*3/uL (ref 0.1–1.0)
Monocytes Relative: 10.2 % (ref 3.0–12.0)
NEUTROS ABS: 2.8 10*3/uL (ref 1.4–7.7)
Neutrophils Relative %: 60.2 % (ref 43.0–77.0)
PLATELETS: 171 10*3/uL (ref 150.0–400.0)
RBC: 5.07 Mil/uL (ref 4.22–5.81)
RDW: 13.6 % (ref 11.5–15.5)
WBC: 4.7 10*3/uL (ref 4.0–10.5)

## 2015-08-04 LAB — COMPREHENSIVE METABOLIC PANEL
ALBUMIN: 4.5 g/dL (ref 3.5–5.2)
ALT: 15 U/L (ref 0–53)
AST: 17 U/L (ref 0–37)
Alkaline Phosphatase: 99 U/L (ref 39–117)
BUN: 16 mg/dL (ref 6–23)
CALCIUM: 9.6 mg/dL (ref 8.4–10.5)
CHLORIDE: 104 meq/L (ref 96–112)
CO2: 30 meq/L (ref 19–32)
Creatinine, Ser: 1.03 mg/dL (ref 0.40–1.50)
GFR: 83.16 mL/min (ref >60.00)
Glucose, Bld: 104 mg/dL — ABNORMAL HIGH (ref 70–99)
POTASSIUM: 4.6 meq/L (ref 3.5–5.1)
SODIUM: 141 meq/L (ref 135–145)
Total Bilirubin: 0.9 mg/dL (ref 0.2–1.2)
Total Protein: 7.7 g/dL (ref 6.0–8.3)

## 2015-08-04 LAB — TSH: TSH: 1.45 u[IU]/mL (ref 0.35–4.50)

## 2015-08-04 LAB — HEMOGLOBIN A1C: HEMOGLOBIN A1C: 5.6 % (ref 4.6–6.5)

## 2015-08-04 LAB — PSA: PSA: 3.53 ng/mL (ref 0.10–4.00)

## 2015-08-04 NOTE — Progress Notes (Signed)
Patient ID: Roy [name], male  DOB: 1970-12-10, 45 y.o.   MRN: [id number]  Subjective:  Roy [name] is a 46 y.o. male present for CPE All past medical history, surgical history, allergies, family history, immunizations, medications and social history were updated  in the electronic medical record today. All recent labs, ED visits and hospitalizations within the last year were reviewed.  Health maintenance:  Colonoscopy: 2009- FH colon cancer, and personal history of diverticulitis. Patient was encouraged to follow-up with colonoscopy release GI referral every 5 years. Patient is overdue. Infectious disease screening: HIV screen indicated, patient amendable to testing today. PSA: Prostrate screening discussed with patient today. He declines digital rectal exam today. He is amendable to PSA collection. Assistive device: None  Oxygen use: None  Patient has a Dental home. Hospitalizations/ED visits:  Patient has undergone a septoplasty this year.  Past Medical History  Diagnosis Date  . Diverticulosis 2009    Colonoscopy confirmed approx 2009 (diverticulitis x 2 episodes).  . Irritable bowel syndrome with diarrhea   . Colitis   . Thrombocytopenia ([place]) 2014    Mild; stable 2014-2016.  Abd u/s 02/2014 NORMAL.  [name] Chronic rhinitis     Deviated nasal septum, hypertrophy of nasal turbinates: Dr. Wilburn [name] to do inferior nasal turb reduction and septoplasty as of 03/28/15.  [name] Snoring     Sleep study in remote past was normal per pt report.  . Sleep concern     study done from a bout 2007- in [place]   . GERD (gastroesophageal reflux disease)    Allergies  Allergen Reactions  . Dayquil Multi-Symptom Cold-Flu [Pseudoephedrine-Apap-Dm] Hives   Past Surgical History  Procedure Laterality Date  . Wrist fracture surgery Left 2010    Hockey injury, plate inserted  . Knee arthroscopy Bilateral     [place]; 2003, 2004, 2010  . Shoulder arthroscopy Bilateral     Bilat;  1993, 2000, 2007  . Eye surgery      lasik  . Wisdom tooth extraction      young adult  . Nasal septoplasty w/ turbinoplasty  04/14/15  . Nasal septoplasty w/ turbinoplasty Bilateral 04/14/2015    Procedure: NASAL SEPTOPLASTY WITH BILATERAL INFERIOR  TURBINATE REDUCTION;  Surgeon: Roy Belfast, MD;  Location: [facility] OR;  Service: ENT;  Laterality: Bilateral;   Family History  Problem Relation Age of Onset  . Pancreatic cancer Maternal Grandmother   . Colon cancer Paternal Grandmother   . Lung cancer Paternal Grandfather      (smoker)  . Colon polyps Father    Social History   Social History  . Marital Status: Married    Spouse Name: N/A  . Number of Children: 4  . Years of Education: N/A   Occupational History  . engineer    Social History Main Topics  . Smoking status: Never Smoker   . Smokeless tobacco: Never Used  . Alcohol Use: 0.0 oz/week    0 Standard drinks or equivalent per week     Comment: beer occasional  . Drug Use: No  . Sexual Activity: Not on file   Other Topics Concern  . Not on file   Social History Narrative   Married, 4 children from ages 17 yrs to 75 yrs.  No sibs.   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   Grad from [employer] in [place], [place].   Originally from [place], lived in [place] for a while, then relocated to [facility] [place]) about 2010.   Exercises  irregularly, no particular dietary focus.   No T/A/Ds.     ROS: Negative, with the exception of above mentioned in HPI  Objective: BP 119/87 mmHg  Pulse 73  Temp(Src) 97.9 F (36.6 C)  Resp 20  Ht 6\' 1"  (1.854 m)  Wt 237 lb (107.502 kg)  BMI 31.27 kg/m2  SpO2 97% Gen: Afebrile. No acute distress. Nontoxic in appearance, well-developed, well-nourished, Caucasian male, very pleasant HENT: AT. [place]. Bilateral TM visualized and normal in appearance, normal external auditory canal. MMM, no oral lesions, good dentition. Bilateral nares without erythema or swelling.  Throat without erythema, ulcerations or exudates. No Cough on exam, no hoarseness on exam. Eyes:Pupils Equal Round Reactive to light, Extraocular movements intact,  Conjunctiva without redness, discharge or icterus. Neck/lymp/endocrine: Supple, no lymphadenopathy, no thyromegaly CV: RRR no murmur appreciated, no edema, +2/4 P posterior tibialis pulses.  No JVD. Chest: CTAB, no wheeze, rhonchi or crackles. Normal Respiratory effort. Good Air movement. Abd: Soft. Obese. NTND. BS present. No Masses palpated. No hepatosplenomegaly. No rebound tenderness or guarding. Skin: No rashes, purpura or petechiae. Warm and well-perfused. Skin intact. Neuro/Msk:  Normal gait. PERLA. EOMi. Alert. Oriented x3.  Cranial nerves II through XII intact. Muscle strength 5/5 upper and lower extremity. DTRs equal bilaterally. Psych: Normal affect, dress and demeanor. Normal speech. Normal thought content and judgment.  Assessment/plan: Roy [name] is a 45 y.o. male present for annual exam.  Prostate cancer screening - PSA - Patient declined digital rectal exam.  Diverticulosis of intestine without bleeding, unspecified intestinal tract location - Referral to gastroenterology to become established. Patient has a family history of colon cancer, history of diverticulitis. Prior gastroenterologist in [place] encouraged every 5 years and colonoscopy, patient is overdue. - Ambulatory referral to Gastroenterology  BMI 31.0-31.9,adult - Exercise and dietary counseling provided - Lipid panel - HgB A1c - TSH  Screening for deficiency anemia - CBC w/Diff  Encounter for screening for HIV - Patient amendable to HIV testing today. - HIV antibody (with reflex)  Encounter for preventive health examination Patient was encouraged to exercise greater than 150 minutes a week. Patient was encouraged to choose a diet filled with fresh fruits and vegetables, and lean meats. AVS provided to patient today for  education/recommendation on gender specific health and safety maintenance. Colonoscopy: 2009- FH colon cancer, and personal history of diverticulitis. Patient was encouraged to follow-up with colonoscopy release GI referral every 5 years. Patient is overdue. Infectious disease screening: HIV screen indicated, patient amendable to testing today. Immunizations: Tetanus up-to-date (2012), influenza recommended yearly and is up-to-date. PSA: Prostrate screening discussed with patient today. He declines digital rectal exam today. He is amendable to PSA collection.  Return in about 1 year (around 08/03/2016) for CPE.  Electronically signed by: Howard Pouch, DO [place]

## 2015-08-04 NOTE — Patient Instructions (Signed)

## 2015-08-07 ENCOUNTER — Telehealth: Payer: Self-pay | Admitting: Family Medicine

## 2015-08-07 DIAGNOSIS — E785 Hyperlipidemia, unspecified: Secondary | ICD-10-CM

## 2015-08-07 DIAGNOSIS — R7301 Impaired fasting glucose: Secondary | ICD-10-CM | POA: Insufficient documentation

## 2015-08-07 MED ORDER — ATORVASTATIN CALCIUM 20 MG PO TABS: 20.0000 mg | ORAL_TABLET | Freq: Every day | ORAL | Status: DC

## 2015-08-07 NOTE — Telephone Encounter (Signed)
Please call pt: - his labs indicate he has a very mildly elevate a1c and glucose. Better diet and exercise will help prevent from worsening to diabetes.  - Cholesterol is high. LDL > 200, should be around 100. Total 256, should be below 200. His levels are much higher than prior collections.  - His cholesterol panel results place him a high CV risk (heart attack/stroke). With these results a cholesterol lowering medication is encouraged, along with increasing exercise to > 150 minutes a week and dietary modifications low saturated fat/sugar/carbs, higher fiber, lean/baked  Meats. I have called this medication in for him to start.  - ONLY  IF he refuses medication: I would at least have him start 3000 mg fish oil daily- statin recommend.  - F/u 3 months after medication start for repeat fasting labs and LFT. Fasting labs prior and then Provider appt2 days later.

## 2015-08-08 NOTE — Telephone Encounter (Signed)
Left message for patient to call back to review lab results and instructions.

## 2015-08-09 NOTE — Telephone Encounter (Signed)
Spoke with patient reviewed lab results and instructions with patient . Answered all questions. Patient verbalized understanding.

## 2015-09-13 ENCOUNTER — Telehealth: Payer: Self-pay | Admitting: Internal Medicine

## 2015-09-13 NOTE — Telephone Encounter (Signed)
He did have colonoscopy in 2009 I was unaware of FH of colon cancer, I was aware of his father having polyps Given recurrent diverticulitis and colonoscopy 8 yrs ago and family hx of colon polyps, repeat colonoscopy is reasonable It would be recommended by guidelines if FH of colon cancer This can be arranged if he wishes to proceed

## 2015-09-13 NOTE — Telephone Encounter (Signed)
Patient returned phone call states he was in a meeting but is now available to be reached. Thanks

## 2015-09-13 NOTE — Telephone Encounter (Signed)
Left message for pt to call back  °

## 2015-09-13 NOTE — Telephone Encounter (Signed)
Pt states that his maternal grandmother had colon cancer and his mother also had diverticulitis. Pt scheduled for colon in the [place] 12/11/15@9am . Pt aware of appts.

## 2015-09-13 NOTE — Telephone Encounter (Signed)
Left message for pt to call back.  Pt states he has had 1-2 bouts of diverticulitis/year and wants to know when he is due for a colon. Last one done in 2009 and he thought he was supposed to have one done in 5 years due to family history of colon ca. Please advise regarding recall.

## 2015-11-29 ENCOUNTER — Ambulatory Visit: Payer: 59 | Admitting: Internal Medicine

## 2015-11-30 ENCOUNTER — Ambulatory Visit (AMBULATORY_SURGERY_CENTER): Payer: Self-pay

## 2015-11-30 VITALS — Ht 73.0 in | Wt 250.0 lb

## 2015-11-30 DIAGNOSIS — Z800 Family history of malignant neoplasm of digestive organs: Secondary | ICD-10-CM

## 2015-11-30 MED ORDER — SUPREP BOWEL PREP KIT 17.5-3.13-1.6 GM/177ML PO SOLN: 1.0000 | Freq: Once | ORAL | 0 refills | Status: AC

## 2015-11-30 NOTE — Progress Notes (Signed)
No allergies to eggs or soy No past problems with anesthesia No diet meds No home oxygen  Declined emmi 

## 2015-12-01 ENCOUNTER — Encounter: Payer: Self-pay | Admitting: Internal Medicine

## 2015-12-11 ENCOUNTER — Ambulatory Visit (AMBULATORY_SURGERY_CENTER): Payer: 59 | Admitting: Internal Medicine

## 2015-12-11 ENCOUNTER — Encounter: Payer: Self-pay | Admitting: Internal Medicine

## 2015-12-11 VITALS — BP 112/80 | HR 71 | Temp 98.0°F | Resp 13 | Ht 73.0 in | Wt 250.0 lb

## 2015-12-11 DIAGNOSIS — D123 Benign neoplasm of transverse colon: Secondary | ICD-10-CM | POA: Diagnosis not present

## 2015-12-11 DIAGNOSIS — D120 Benign neoplasm of cecum: Secondary | ICD-10-CM | POA: Diagnosis not present

## 2015-12-11 DIAGNOSIS — Z1211 Encounter for screening for malignant neoplasm of colon: Secondary | ICD-10-CM

## 2015-12-11 DIAGNOSIS — Z1212 Encounter for screening for malignant neoplasm of rectum: Secondary | ICD-10-CM

## 2015-12-11 DIAGNOSIS — D124 Benign neoplasm of descending colon: Secondary | ICD-10-CM

## 2015-12-11 DIAGNOSIS — Z800 Family history of malignant neoplasm of digestive organs: Secondary | ICD-10-CM

## 2015-12-11 DIAGNOSIS — Z8371 Family history of colonic polyps: Secondary | ICD-10-CM

## 2015-12-11 DIAGNOSIS — Z860101 Personal history of adenomatous and serrated colon polyps: Secondary | ICD-10-CM

## 2015-12-11 DIAGNOSIS — K635 Polyp of colon: Secondary | ICD-10-CM

## 2015-12-11 DIAGNOSIS — Z86010 Personal history of colonic polyps: Secondary | ICD-10-CM

## 2015-12-11 HISTORY — DX: Personal history of colonic polyps: Z86.010

## 2015-12-11 HISTORY — DX: Personal history of adenomatous and serrated colon polyps: Z86.0101

## 2015-12-11 HISTORY — PX: COLONOSCOPY W/ POLYPECTOMY: SHX1380

## 2015-12-11 MED ORDER — SODIUM CHLORIDE 0.9 % IV SOLN: 500.0000 mL | INTRAVENOUS | Status: DC

## 2015-12-11 NOTE — Progress Notes (Signed)
Report to PACU, RN, vss, BBS= Clear.  

## 2015-12-11 NOTE — Patient Instructions (Signed)
Discharge instructions given. Handouts on polyps,diverticulosis and hemorrhoids. Resume previous medications. YOU HAD AN ENDOSCOPIC PROCEDURE TODAY AT THE [facility]:   Refer to the procedure report that was given to you for any specific questions about what was found during the examination.  If the procedure report does not answer your questions, please call your gastroenterologist to clarify.  If you requested that your care partner not be given the details of your procedure findings, then the procedure report has been included in a sealed envelope for you to review at your convenience later.  YOU SHOULD EXPECT: Some feelings of bloating in the abdomen. Passage of more gas than usual.  Walking can help get rid of the air that was put into your GI tract during the procedure and reduce the bloating. If you had a lower endoscopy (such as a colonoscopy or flexible sigmoidoscopy) you may notice spotting of blood in your stool or on the toilet paper. If you underwent a bowel prep for your procedure, you may not have a normal bowel movement for a few days.  Please Note:  You might notice some irritation and congestion in your nose or some drainage.  This is from the oxygen used during your procedure.  There is no need for concern and it should clear up in a day or so.  SYMPTOMS TO REPORT IMMEDIATELY:   Following lower endoscopy (colonoscopy or flexible sigmoidoscopy):  Excessive amounts of blood in the stool  Significant tenderness or worsening of abdominal pains  Swelling of the abdomen that is new, acute  Fever of 100F or higher   For urgent or emergent issues, a gastroenterologist can be reached at any hour by calling [phone].   DIET:  We do recommend a small meal at first, but then you may proceed to your regular diet.  Drink plenty of fluids but you should avoid alcoholic beverages for 24 hours.  ACTIVITY:  You should plan to take it easy for the rest of today and you  should NOT DRIVE or use heavy machinery until tomorrow (because of the sedation medicines used during the test).    FOLLOW UP: Our staff will call the number listed on your records the next business day following your procedure to check on you and address any questions or concerns that you may have regarding the information given to you following your procedure. If we do not reach you, we will leave a message.  However, if you are feeling well and you are not experiencing any problems, there is no need to return our call.  We will assume that you have returned to your regular daily activities without incident.  If any biopsies were taken you will be contacted by phone or by letter within the next 1-3 weeks.  Please call us at [phone] if you have not heard about the biopsies in 3 weeks.    SIGNATURES/CONFIDENTIALITY: You and/or your care partner have signed paperwork which will be entered into your electronic medical record.  These signatures attest to the fact that that the information above on your After Visit Summary has been reviewed and is understood.  Full responsibility of the confidentiality of this discharge information lies with you and/or your care-partner. 

## 2015-12-11 NOTE — Op Note (Signed)
[facility] Patient Name: Roy [name] Procedure Date: 12/11/2015 8:56 AM MRN: [id number] Endoscopist: Jerene Bears , MD Age: 45 Referring MD:  Date of Birth: [birth year] Gender: Male Account #: [id number] Procedure:                Colonoscopy Indications:              Colon cancer screening in patient at increased                            risk: Family history of 1st-degree relative with                            colon polyps, personal history of                            diverticulosis/diverticulitis Medicines:                Monitored Anesthesia Care Procedure:                Pre-Anesthesia Assessment:                           - Prior to the procedure, a History and Physical                            was performed, and patient medications and                            allergies were reviewed. The patient's tolerance of                            previous anesthesia was also reviewed. The risks                            and benefits of the procedure and the sedation                            options and risks were discussed with the patient.                            All questions were answered, and informed consent                            was obtained. Prior Anticoagulants: The patient has                            taken no previous anticoagulant or antiplatelet                            agents. ASA Grade Assessment: II - A patient with                            mild systemic disease. After reviewing the risks  and benefits, the patient was deemed in                            satisfactory condition to undergo the procedure.                           After obtaining informed consent, the colonoscope                            was passed under direct vision. Throughout the                            procedure, the patient's blood pressure, pulse, and                            oxygen saturations were monitored continuously.  The                            Model CF-HQ190L [id number]) scope was introduced                            through the anus and advanced to the the terminal                            ileum. The colonoscopy was performed without                            difficulty. The patient tolerated the procedure                            well. The quality of the bowel preparation was                            good. The terminal ileum, ileocecal valve,                            appendiceal orifice, and rectum were photographed. Scope In: 9:02:49 AM Scope Out: 9:19:46 AM Scope Withdrawal Time: 0 hours 15 minutes 32 seconds  Total Procedure Duration: 0 hours 16 minutes 57 seconds  Findings:                 The digital rectal exam was normal.                           The terminal ileum appeared normal.                           A 2 mm polyp was found in the cecum. The polyp was                            sessile. The polyp was removed with a cold biopsy                            forceps. Resection and retrieval were complete.  A 3 mm polyp was found in the proximal transverse                            colon. The polyp was sessile. The polyp was removed                            with a cold biopsy forceps. Resection and retrieval                            were complete.                           A 4 mm polyp was found in the hepatic flexure. The                            polyp was sessile. The polyp was removed with a                            cold snare. Resection and retrieval were complete.                           Two sessile polyps were found in the distal                            transverse colon. The polyps were 4 to 5 mm in                            size. These polyps were removed with a cold snare.                            Resection and retrieval were complete.                           A 6 mm polyp was found in the descending colon. The                             polyp was semi-pedunculated. The polyp was removed                            with a cold snare. Resection and retrieval were                            complete.                           Multiple small and large-mouthed diverticula were                            found in the sigmoid colon and distal descending                            colon. Petechia were visualized in association with  the diverticular opening.                           Internal hemorrhoids were found during                            retroflexion. The hemorrhoids were small. Complications:            No immediate complications. Estimated Blood Loss:     Estimated blood loss was minimal. Impression:               - The examined portion of the ileum was normal.                           - One 2 mm polyp in the cecum, removed with a cold                            biopsy forceps. Resected and retrieved.                           - One 3 mm polyp in the proximal transverse colon,                            removed with a cold biopsy forceps. Resected and                            retrieved.                           - One 4 mm polyp at the hepatic flexure, removed                            with a cold snare. Resected and retrieved.                           - Two 4 to 5 mm polyps in the distal transverse                            colon, removed with a cold snare. Resected and                            retrieved.                           - One 6 mm polyp in the descending colon, removed                            with a cold snare. Resected and retrieved.                           - Moderate diverticulosis in the sigmoid colon and                            in the distal descending colon. Petechia were  visualized in association with the diverticular                            opening.                           - Internal hemorrhoids. Recommendation:            - Patient has a contact number available for                            emergencies. The signs and symptoms of potential                            delayed complications were discussed with the                            patient. Return to normal activities tomorrow.                            Written discharge instructions were provided to the                            patient.                           - Resume previous diet.                           - Continue present medications.                           - Await pathology results.                           - Repeat colonoscopy is recommended. The                            colonoscopy date will be determined after pathology                            results from today's exam become available for                            review. Jerene Bears, MD 12/11/2015 9:26:46 AM This report has been signed electronically.

## 2015-12-12 ENCOUNTER — Telehealth: Payer: Self-pay

## 2015-12-12 NOTE — Telephone Encounter (Signed)
  Follow up Call-  Call back number 12/11/2015  Post procedure Call Back phone  # [phone]  Permission to leave phone message Yes  Some recent data might be hidden     Patient questions:  Do you have a fever, pain , or abdominal swelling? No. Pain Score  0 *  Have you tolerated food without any problems? Yes.    Have you been able to return to your normal activities? Yes.    Do you have any questions about your discharge instructions: Diet   No Medications  No. Follow up visit  No Do you have questions or concerns about your Care? No.  Actions: * If pain score is 4 or above: No action needed, pain <4.

## 2015-12-18 ENCOUNTER — Encounter: Payer: Self-pay | Admitting: Internal Medicine

## 2015-12-21 ENCOUNTER — Encounter: Payer: Self-pay | Admitting: Family Medicine

## 2016-02-26 DIAGNOSIS — M7540 Impingement syndrome of unspecified shoulder: Secondary | ICD-10-CM

## 2016-02-26 HISTORY — DX: Impingement syndrome of unspecified shoulder: M75.40

## 2016-04-10 DIAGNOSIS — M531 Cervicobrachial syndrome: Secondary | ICD-10-CM | POA: Diagnosis not present

## 2016-04-10 DIAGNOSIS — M9901 Segmental and somatic dysfunction of cervical region: Secondary | ICD-10-CM | POA: Diagnosis not present

## 2016-04-10 DIAGNOSIS — M5032 Other cervical disc degeneration, mid-cervical region, unspecified level: Secondary | ICD-10-CM | POA: Diagnosis not present

## 2016-05-30 NOTE — Progress Notes (Signed)
[name] [address], [place] [address] Phone: [phone] Subjective:    I'm seeing this patient by the request  of:    CC: Right shoulder pain  IEP:[id number]  Roy [name] is a 46 y.o. male coming in with complaint of right shoulder pain. Past medical history significant for rotator cuff repair greater than a decade ago. Patient states that this is not similar to that. States that over the course last several weeks that having increasing pain. Patient states that it is waking him up at night. No weakness. Had been lifting a more regular basis. Patient denies any radiation of the arm. Rates the severity of pain though is 6 out of 10. Still able to do daily activities but very aggravating. Denies any neck pain associated with it. Does respond to anti-inflammatories if he uses him.   complaining of some mild midline low back pain. Patient was seen and came down off of a bump. Had radiation of pain going down both arms as well as both legs. Since then a dull aching pain in the mid back. Not stopping him from anything but is annoying. Not improving either.  Past Medical History:  Diagnosis Date  . Chronic rhinitis    Deviated nasal septum, hypertrophy of nasal turbinates: Dr. Wilburn Cornelia to do inferior nasal turb reduction and septoplasty as of 03/28/15.  . Colitis   . Diverticulosis 2009   Colonoscopy confirmed approx 2009 (diverticulitis x 2 episodes).  [name] GERD (gastroesophageal reflux disease)   . History of adenomatous polyp of colon 12/11/2015   Recall 5 yrs  . Irritable bowel syndrome with diarrhea   . Sleep concern    study done from a bout 2007- in [place]   . Snoring    Sleep study in remote past was normal per pt report.  . Thrombocytopenia ([place]) 2014   Mild; stable 2014-2016.  Abd u/s 02/2014 NORMAL.   Past Surgical History:  Procedure Laterality Date  . COLONOSCOPY W/ POLYPECTOMY  12/11/2015   Tubular adenoma x 1,  +Diverticulosis and int hem.  Recall 5 yrs.  [name] EYE SURGERY     lasik  . KNEE ARTHROSCOPY Bilateral    Bilat; 2003, 2004, 2010  . NASAL SEPTOPLASTY W/ TURBINOPLASTY  04/14/15  . NASAL SEPTOPLASTY W/ TURBINOPLASTY Bilateral 04/14/2015   Procedure: NASAL SEPTOPLASTY WITH BILATERAL INFERIOR  TURBINATE REDUCTION;  Surgeon: Jerrell Belfast, MD;  Location: [facility];  Service: ENT;  Laterality: Bilateral;  . SHOULDER ARTHROSCOPY Bilateral    Bilat; 1993, 2000, 2007  . WISDOM TOOTH EXTRACTION     young adult  . WRIST FRACTURE SURGERY Left 2010   Hockey injury, plate inserted   Social History   Social History  . Marital status: Married    Spouse name: N/A  . Number of children: 4  . Years of education: N/A   Occupational History  . engineer    Social History Main Topics  . Smoking status: Never Smoker  . Smokeless tobacco: Never Used  . Alcohol use 0.0 oz/week     Comment: beer occasional  . Drug use: No  . Sexual activity: Not Asked   Other Topics Concern  . None   Social History Narrative   Married, 4 children from ages 17 yrs to 23 yrs.  No sibs.   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   Grad from [employer] in [place], [place].   Originally from [place], lived in [place] for a while, then relocated  to [place] [employer]) about 2010.   Exercises irregularly, no particular dietary focus.   No T/A/Ds.   Allergies  Allergen Reactions  . Dayquil Multi-Symptom Cold-Flu [Pseudoephedrine-Apap-Dm] Hives   Family History  Problem Relation Age of Onset  . Pancreatic cancer Paternal Grandfather   . Lung cancer Paternal Grandfather      (smoker)  . Colon cancer Maternal Grandmother   . Colon polyps Father   . Diverticulitis Mother     Past medical history, social, surgical and family history all reviewed in electronic medical record.  No pertanent information unless stated regarding to the chief complaint.   Review of Systems:Review of systems updated and as  accurate as of 05/31/16  No headache, visual changes, nausea, vomiting, diarrhea, constipation, dizziness, abdominal pain, skin rash, fevers, chills, night sweats, weight loss, swollen lymph nodes, body aches, joint swelling, muscle aches, chest pain, shortness of breath, mood changes.   Objective  Blood pressure 114/84, pulse 70, height 6\' 1"  (1.854 m). Systems examined below as of 05/31/16   General: No apparent distress alert and oriented x3 mood and affect normal, dressed appropriately.  HEENT: Pupils equal, extraocular movements intact  Respiratory: Patient's speak in full sentences and does not appear short of breath  Cardiovascular: No lower extremity edema, non tender, no erythema  Skin: Warm dry intact with no signs of infection or rash on extremities or on axial skeleton.  Abdomen: Soft nontender  Neuro: Cranial nerves II through XII are intact, neurovascularly intact in all extremities with 2+ DTRs and 2+ pulses.  Lymph: No lymphadenopathy of posterior or anterior cervical chain or axillae bilaterally.  Gait normal with good balance and coordination.  MSK:  Non tender with full range of motion and good stability and symmetric strength and tone of  elbows, wrist, hip, knee and ankles bilaterally.   Back exam shows the patient does have full range of motion. Patient does have some limitation with extension into some pain midline over the L3 vertebrae. Negative straight leg test bilaterally   Shoulder: Right Inspection reveals no abnormalities, atrophy or asymmetry. Palpation is normal with no tenderness over AC joint or bicipital groove. ROM is full in all planes passively. Rotator cuff strength normal throughout. signs of impingement with positive Neer and Hawkin's tests, but negative empty can sign. Speeds and Yergason's tests normal. No labral pathology noted with negative Obrien's, negative clunk and good stability. Normal scapular function observed. No painful arc and no  drop arm sign. No apprehension sign  MSK US performed of: Right This study was ordered, performed, and interpreted by Charlann Boxer D.O.  Shoulder:   Supraspinatus:  Appears normal on long and transverse views, Bursal bulge seen with shoulder abduction on impingement view. Infraspinatus:  Appears normal on long and transverse views. Significant increase in Doppler flow Subscapularis:  Appears normal on long and transverse views. Positive bursa Teres Minor:  Appears normal on long and transverse views. AC joint:  Capsule undistended, no geyser sign. Glenohumeral Joint:  Appears normal without effusion. Glenoid Labrum:  Intact without visualized tears. Biceps Tendon:  Appears normal on long and transverse views, no fraying of tendon, tendon located in intertubercular groove, no subluxation with shoulder internal or external rotation.  Impression: Subacromial bursitis  Procedure: Real-time Ultrasound Guided Injection of right glenohumeral joint Device: GE Logiq E  Ultrasound guided injection is preferred based studies that show increased duration, increased effect, greater accuracy, decreased procedural pain, increased response rate with ultrasound guided versus blind injection.  Verbal informed  consent obtained.  Time-out conducted.  Noted no overlying erythema, induration, or other signs of local infection.  Skin prepped in a sterile fashion.  Local anesthesia: Topical Ethyl chloride.  With sterile technique and under real time ultrasound guidance:  Joint visualized.  23g 1  inch needle inserted posterior approach. Pictures taken for needle placement. Patient did have injection of 2 cc of 1% lidocaine, 2 cc of 0.5% Marcaine, and 1.0 cc of Kenalog 40 mg/dL. Completed without difficulty  Pain immediately resolved suggesting accurate placement of the medication.  Advised to call if fevers/chills, erythema, induration, drainage, or persistent bleeding.  Images permanently stored and available  for review in the ultrasound unit.  Impression: Technically successful ultrasound guided injection.  Procedure note 32761; 15 minutes spent for Therapeutic exercises as stated in above notes.  This included exercises focusing on stretching, strengthening, with significant focus on eccentric aspects. Shoulder Exercises that included:  Basic scapular stabilization to include adduction and depression of scapula Scaption, focusing on proper movement and good control Internal and External rotation utilizing a theraband, with elbow tucked at side entire time Rows with theraband    Proper technique shown and discussed handout in great detail with ATC.  All questions were discussed and answered.     Impression and Recommendations:     This case required medical decision making of moderate complexity.      Note: This dictation was prepared with Dragon dictation along with smaller phrase technology. Any transcriptional errors that result from this process are unintentional.

## 2016-05-31 ENCOUNTER — Encounter: Payer: Self-pay | Admitting: Family Medicine

## 2016-05-31 ENCOUNTER — Ambulatory Visit (INDEPENDENT_AMBULATORY_CARE_PROVIDER_SITE_OTHER): Payer: 59 | Admitting: Family Medicine

## 2016-05-31 ENCOUNTER — Ambulatory Visit: Payer: Self-pay

## 2016-05-31 ENCOUNTER — Ambulatory Visit (INDEPENDENT_AMBULATORY_CARE_PROVIDER_SITE_OTHER)
Admission: RE | Admit: 2016-05-31 | Discharge: 2016-05-31 | Disposition: A | Discharge status: Home / Self Care | Payer: 59 | Source: Ambulatory Visit | Attending: Family Medicine | Admitting: Family Medicine

## 2016-05-31 VITALS — BP 114/84 | HR 70 | Ht 73.0 in

## 2016-05-31 DIAGNOSIS — M7550 Bursitis of unspecified shoulder: Secondary | ICD-10-CM | POA: Insufficient documentation

## 2016-05-31 DIAGNOSIS — M545 Low back pain, unspecified: Secondary | ICD-10-CM | POA: Insufficient documentation

## 2016-05-31 DIAGNOSIS — M25511 Pain in right shoulder: Secondary | ICD-10-CM | POA: Diagnosis not present

## 2016-05-31 DIAGNOSIS — M7551 Bursitis of right shoulder: Secondary | ICD-10-CM | POA: Diagnosis not present

## 2016-05-31 MED ORDER — VITAMIN D (ERGOCALCIFEROL) 1.25 MG (50000 UNIT) PO CAPS: 50000.0000 [IU] | ORAL_CAPSULE | ORAL | 0 refills | Status: DC

## 2016-05-31 NOTE — Assessment & Plan Note (Signed)
Patient did have a bursitis noted today. No true tear of the rotator cuff noted. Once weekly vitamin D to help with muscle strength and endurance. Home exercises and work with Product/process development scientist. We discussed which activities to do a which ones to avoid. Patient will slowly increase activity over the course of time. Follow-up again with me in 4-6 weeks

## 2016-05-31 NOTE — Assessment & Plan Note (Signed)
Patient did have more of a low back pain. Seems to be getting patient difficult he. We discussed icing regimen and home exercises. We discussed which activities doing which ones to avoid. Patient will start to increase activity as tolerated. X-rays ordered today secondary to the presentation to rule out any type of compression fracture with midline pain

## 2016-05-31 NOTE — Patient Instructions (Addendum)
Ice 20 minutes 2 times daily. Usually after activity and before bed. Exercises 3 times a week.  pennsaid pinkie amount topically 2 times daily as needed.  Once weekly vitamin D Keep hands within peripheral vision.  Start lifting after the weekend but start 50% and increase 10% a week.  See me again in 4-6 weeks.

## 2016-06-05 DIAGNOSIS — M531 Cervicobrachial syndrome: Secondary | ICD-10-CM | POA: Diagnosis not present

## 2016-06-05 DIAGNOSIS — M5032 Other cervical disc degeneration, mid-cervical region, unspecified level: Secondary | ICD-10-CM | POA: Diagnosis not present

## 2016-06-05 DIAGNOSIS — M9901 Segmental and somatic dysfunction of cervical region: Secondary | ICD-10-CM | POA: Diagnosis not present

## 2016-06-18 ENCOUNTER — Other Ambulatory Visit: Payer: Self-pay | Admitting: *Deleted

## 2016-06-18 MED ORDER — OMEPRAZOLE 40 MG PO CPDR: 40.0000 mg | DELAYED_RELEASE_CAPSULE | Freq: Every day | ORAL | 6 refills | Status: DC

## 2016-06-18 NOTE — Telephone Encounter (Signed)
[facility].  RF request for omeprazole LOV: 08/04/15 Next ov: None Last written: 06/06/15 #30 w/ 11RF

## 2016-07-10 ENCOUNTER — Ambulatory Visit (INDEPENDENT_AMBULATORY_CARE_PROVIDER_SITE_OTHER): Payer: 59 | Admitting: Family Medicine

## 2016-07-10 ENCOUNTER — Encounter: Payer: Self-pay | Admitting: Family Medicine

## 2016-07-10 DIAGNOSIS — M7551 Bursitis of right shoulder: Secondary | ICD-10-CM | POA: Diagnosis not present

## 2016-07-10 MED ORDER — NITROGLYCERIN 0.2 MG/HR TD PT24: MEDICATED_PATCH | TRANSDERMAL | 1 refills | Status: DC

## 2016-07-10 NOTE — Assessment & Plan Note (Signed)
Patient does have some improvement but not all the way making improvement. Seems now no improvement from previous exam. Does not want to do formal physical therapy at this time. Has responded fairly well to nitroglycerin in the past. We will start this again. Warned of potential side effects. We discussed proper way of using it. Patient will come back and see me again in 4-6 weeks for further evaluation.

## 2016-07-10 NOTE — Progress Notes (Signed)
[name] [address], [place] [address] Phone: [phone] Subjective:    CC: Right shoulder pain f/u  GEX:[id number]  Keanen Armendariz is a 46 y.o. male coming in with complaint of right shoulder pain. Past medical history significant for rotator cuff repair greater than a decade ago.Patient was seen by me and did have more of a shoulder bursitis. Was injected back in April. Responded well to this but still is having discomfort. Patient states he is having very similar pain on the other side as well. Certain movements causes a aching sensation. Uncomfortable at night. No radiation down the arms though. Denies any neck pain.    Past Medical History:  Diagnosis Date  . Chronic rhinitis    Deviated nasal septum, hypertrophy of nasal turbinates: Dr. Wilburn Cornelia to do inferior nasal turb reduction and septoplasty as of 03/28/15.  . Colitis   . Diverticulosis 2009   Colonoscopy confirmed approx 2009 (diverticulitis x 2 episodes).  [name] GERD (gastroesophageal reflux disease)   . History of adenomatous polyp of colon 12/11/2015   Recall 5 yrs  . Irritable bowel syndrome with diarrhea   . Sleep concern    study done from a bout 2007- in [place]   . Snoring    Sleep study in remote past was normal per pt report.  . Thrombocytopenia ([place]) 2014   Mild; stable 2014-2016.  Abd u/s 02/2014 NORMAL.   Past Surgical History:  Procedure Laterality Date  . COLONOSCOPY W/ POLYPECTOMY  12/11/2015   Tubular adenoma x 1, +Diverticulosis and int hem.  Recall 5 yrs.  [name] EYE SURGERY     lasik  . KNEE ARTHROSCOPY Bilateral    Bilat; 2003, 2004, 2010  . NASAL SEPTOPLASTY W/ TURBINOPLASTY  04/14/15  . NASAL SEPTOPLASTY W/ TURBINOPLASTY Bilateral 04/14/2015   Procedure: NASAL SEPTOPLASTY WITH BILATERAL INFERIOR  TURBINATE REDUCTION;  Surgeon: Jerrell Belfast, MD;  Location: [facility];  Service: ENT;  Laterality: Bilateral;  . SHOULDER ARTHROSCOPY Bilateral    Bilat;  1993, 2000, 2007  . WISDOM TOOTH EXTRACTION     young adult  . WRIST FRACTURE SURGERY Left 2010   Hockey injury, plate inserted   Social History   Social History  . Marital status: Married    Spouse name: N/A  . Number of children: 4  . Years of education: N/A   Occupational History  . engineer    Social History Main Topics  . Smoking status: Never Smoker  . Smokeless tobacco: Never Used  . Alcohol use 0.0 oz/week     Comment: beer occasional  . Drug use: No  . Sexual activity: Not Asked   Other Topics Concern  . None   Social History Narrative   Married, 4 children from ages 56 yrs to 89 yrs.  No sibs.   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   Grad from [employer] in [place], [place].   Originally from [place], lived in [place] for a while, then relocated to [facility]) about 2010.   Exercises irregularly, no particular dietary focus.   No T/A/Ds.   Allergies  Allergen Reactions  . Dayquil Multi-Symptom Cold-Flu [Pseudoephedrine-Apap-Dm] Hives   Family History  Problem Relation Age of Onset  . Pancreatic cancer Paternal Grandfather   . Lung cancer Paternal Grandfather         (smoker)  . Colon cancer Maternal Grandmother   . Colon polyps Father   . Diverticulitis Mother     Past medical history,  social, surgical and family history all reviewed in electronic medical record.  No pertanent information unless stated regarding to the chief complaint.   Review of Systems: No headache, visual changes, nausea, vomiting, diarrhea, constipation, dizziness, abdominal pain, skin rash, fevers, chills, night sweats, weight loss, swollen lymph nodes, body aches, joint swelling, , chest pain, shortness of breath, mood changes.  Positive muscle aches  Objective  Blood pressure 120/82, pulse 72, height 6\' 1"  (1.854 m), weight 254 lb (115.2 kg), SpO2 91 %.   Systems examined below as of 07/10/16 General: NAD A&O x3 mood, affect normal  HEENT: Pupils  equal, extraocular movements intact no nystagmus Respiratory: not short of breath at rest or with speaking Cardiovascular: No lower extremity edema, non tender Skin: Warm dry intact with no signs of infection or rash on extremities or on axial skeleton. Abdomen: Soft nontender, no masses Neuro: Cranial nerves  intact, neurovascularly intact in all extremities with 2+ DTRs and 2+ pulses. Lymph: No lymphadenopathy appreciated today  Gait normal with good balance and coordination.  MSK: Non tender with full range of motion and good stability and symmetric strength and tone of  elbows, wrist,  knee hips and ankles bilaterally.    Back exam shows the patient does have full range of motion. Patient does have some limitation with extension into some pain midline over the L3 vertebrae. Negative straight leg test bilaterally   Shoulder: Right Inspection reveals no abnormalities, atrophy or asymmetry. Palpation is normal with no tenderness over AC joint or bicipital groove. ROM is full in all planes passively. Rotator cuff strength normal throughout. signs of impingement with positive Neer and Hawkin's tests, but negative empty can sign. Speeds and Yergason's tests normal. Positive labral pathology Normal scapular function observed. No painful arc and no drop arm sign. No apprehension sign Contralateral shoulder impingement signs noted.     Impression and Recommendations:     This case required medical decision making of moderate complexity.      Note: This dictation was prepared with Dragon dictation along with smaller phrase technology. Any transcriptional errors that result from this process are unintentional.

## 2016-07-10 NOTE — Patient Instructions (Signed)
Good to see you  Keep hands within peripheral vision Start lifting again.  Ice 20 minutes 2 times daily. Usually after activity and before bed. Exercises 3 times a week.  pennsaid pinkie amount topically 2 times daily as needed.  For the ankle [email or web address] size large ankle compression x-linked Spenco orthotics "total support" online would be great  Exercises 3 times a week.  See me again in 4 weeks.

## 2016-07-17 DIAGNOSIS — M9901 Segmental and somatic dysfunction of cervical region: Secondary | ICD-10-CM | POA: Diagnosis not present

## 2016-07-17 DIAGNOSIS — M531 Cervicobrachial syndrome: Secondary | ICD-10-CM | POA: Diagnosis not present

## 2016-07-17 DIAGNOSIS — M5032 Other cervical disc degeneration, mid-cervical region, unspecified level: Secondary | ICD-10-CM | POA: Diagnosis not present

## 2016-07-18 ENCOUNTER — Encounter: Payer: Self-pay | Admitting: Family Medicine

## 2016-07-29 ENCOUNTER — Telehealth: Payer: Self-pay | Admitting: Family Medicine

## 2016-07-29 DIAGNOSIS — Z125 Encounter for screening for malignant neoplasm of prostate: Secondary | ICD-10-CM

## 2016-07-29 DIAGNOSIS — Z0000 Encounter for general adult medical examination without abnormal findings: Secondary | ICD-10-CM

## 2016-07-29 NOTE — Telephone Encounter (Signed)
Patient coming into office 07/31/16 for CPE labs.  Anything else we need to add?  Please advise.

## 2016-07-29 NOTE — Telephone Encounter (Signed)
Pls add PSA with dx of prostate cancer screening.-thx

## 2016-07-31 ENCOUNTER — Other Ambulatory Visit (INDEPENDENT_AMBULATORY_CARE_PROVIDER_SITE_OTHER): Payer: 59

## 2016-07-31 DIAGNOSIS — Z131 Encounter for screening for diabetes mellitus: Secondary | ICD-10-CM | POA: Diagnosis not present

## 2016-07-31 DIAGNOSIS — Z125 Encounter for screening for malignant neoplasm of prostate: Secondary | ICD-10-CM | POA: Diagnosis not present

## 2016-07-31 DIAGNOSIS — H40023 Open angle with borderline findings, high risk, bilateral: Secondary | ICD-10-CM | POA: Diagnosis not present

## 2016-07-31 DIAGNOSIS — Z0000 Encounter for general adult medical examination without abnormal findings: Secondary | ICD-10-CM

## 2016-07-31 DIAGNOSIS — R7989 Other specified abnormal findings of blood chemistry: Secondary | ICD-10-CM

## 2016-07-31 LAB — CBC WITH DIFFERENTIAL/PLATELET
BASOS ABS: 0 10*3/uL (ref 0.0–0.1)
BASOS PCT: 1 % (ref 0.0–3.0)
EOS PCT: 3.7 % (ref 0.0–5.0)
Eosinophils Absolute: 0.2 10*3/uL (ref 0.0–0.7)
HEMATOCRIT: 46.2 % (ref 39.0–52.0)
Hemoglobin: 15.8 g/dL (ref 13.0–17.0)
LYMPHS ABS: 1.3 10*3/uL (ref 0.7–4.0)
LYMPHS PCT: 29.7 % (ref 12.0–46.0)
MCHC: 34.1 g/dL (ref 30.0–36.0)
MCV: 94.3 fl (ref 78.0–100.0)
MONOS PCT: 10.5 % (ref 3.0–12.0)
Monocytes Absolute: 0.5 10*3/uL (ref 0.1–1.0)
NEUTROS ABS: 2.4 10*3/uL (ref 1.4–7.7)
NEUTROS PCT: 55.1 % (ref 43.0–77.0)
PLATELETS: 134 10*3/uL — AB (ref 150.0–400.0)
RBC: 4.9 Mil/uL (ref 4.22–5.81)
RDW: 13.6 % (ref 11.5–15.5)
WBC: 4.3 10*3/uL (ref 4.0–10.5)

## 2016-07-31 LAB — COMPREHENSIVE METABOLIC PANEL
ALT: 15 U/L (ref 0–53)
AST: 16 U/L (ref 0–37)
Albumin: 4.4 g/dL (ref 3.5–5.2)
Alkaline Phosphatase: 88 U/L (ref 39–117)
BILIRUBIN TOTAL: 0.7 mg/dL (ref 0.2–1.2)
BUN: 13 mg/dL (ref 6–23)
CALCIUM: 9.4 mg/dL (ref 8.4–10.5)
CHLORIDE: 104 meq/L (ref 96–112)
CO2: 30 meq/L (ref 19–32)
Creatinine, Ser: 1.1 mg/dL (ref 0.40–1.50)
GFR: 76.74 mL/min (ref >60.00)
GLUCOSE: 101 mg/dL — AB (ref 70–99)
Potassium: 4.5 mEq/L (ref 3.5–5.1)
Sodium: 139 mEq/L (ref 135–145)
Total Protein: 7.2 g/dL (ref 6.0–8.3)

## 2016-07-31 LAB — LIPID PANEL
Cholesterol: 232 mg/dL — ABNORMAL HIGH (ref 0–200)
HDL: 33.1 mg/dL — AB (ref >39.00)
NonHDL: 198.71
Total CHOL/HDL Ratio: 7
Triglycerides: 261 mg/dL — ABNORMAL HIGH (ref 0.0–149.0)
VLDL: 52.2 mg/dL — ABNORMAL HIGH (ref 0.0–40.0)

## 2016-07-31 LAB — PSA: PSA: 4.29 ng/mL — AB (ref 0.10–4.00)

## 2016-07-31 LAB — LDL CHOLESTEROL, DIRECT: LDL DIRECT: 139 mg/dL

## 2016-07-31 LAB — HEMOGLOBIN A1C: Hgb A1c MFr Bld: 5.8 % (ref 4.6–6.5)

## 2016-07-31 LAB — TSH: TSH: 2.24 u[IU]/mL (ref 0.35–4.50)

## 2016-08-05 ENCOUNTER — Encounter: Payer: Self-pay | Admitting: Family Medicine

## 2016-08-05 ENCOUNTER — Ambulatory Visit (INDEPENDENT_AMBULATORY_CARE_PROVIDER_SITE_OTHER): Payer: 59 | Admitting: Family Medicine

## 2016-08-05 VITALS — BP 128/81 | HR 77 | Temp 98.4°F | Resp 16 | Ht 73.0 in | Wt 254.5 lb

## 2016-08-05 DIAGNOSIS — R972 Elevated prostate specific antigen [PSA]: Secondary | ICD-10-CM | POA: Diagnosis not present

## 2016-08-05 DIAGNOSIS — Z23 Encounter for immunization: Secondary | ICD-10-CM | POA: Diagnosis not present

## 2016-08-05 DIAGNOSIS — Z0000 Encounter for general adult medical examination without abnormal findings: Secondary | ICD-10-CM | POA: Diagnosis not present

## 2016-08-05 DIAGNOSIS — E7800 Pure hypercholesterolemia, unspecified: Secondary | ICD-10-CM

## 2016-08-05 DIAGNOSIS — R7301 Impaired fasting glucose: Secondary | ICD-10-CM

## 2016-08-05 NOTE — Progress Notes (Signed)
Office Note 08/05/2016  CC:  Chief Complaint  Patient presents with  . Annual Exam    Labs done on 07/31/16    HPI:  Roy [name] is a 46 y.o. White male who is here for annual health maintenance exam. Discussed recent fasting labs: cholesterol mildly elevated but meds not indicated. Fasting glucose 101, A1c 5.8% (stable). PSA mildly elevated, velocity increased--discussed urology referral today.  Eyes; exam recently showed possible signs of glaucoma so he has f/u set up.  Dental: preventatives UTD.  Diet/Exercise: working on it.   Past Medical History:  Diagnosis Date  . Chronic rhinitis    Deviated nasal septum, hypertrophy of nasal turbinates: Dr. Wilburn Cornelia to do inferior nasal turb reduction and septoplasty as of 03/28/15.  . Colitis   . Diverticulosis 2009   Colonoscopy confirmed approx 2009 (diverticulitis x 2 episodes).  [name] GERD (gastroesophageal reflux disease)   . History of adenomatous polyp of colon 12/11/2015   Recall 5 yrs  . Irritable bowel syndrome with diarrhea   . Rotator cuff impingement syndrome 2018   Bilat (Dr. Tamala Julian)  . Sleep concern    study done from a bout 2007- in [place]   . Snoring    Sleep study in remote past was normal per pt report.  . Thrombocytopenia ([place]) 2014   Mild; stable 2014-2016.  Abd u/s 02/2014 NORMAL.    Past Surgical History:  Procedure Laterality Date  . COLONOSCOPY W/ POLYPECTOMY  12/11/2015   Tubular adenoma x 1, +Diverticulosis and int hem.  Recall 5 yrs.  [name] EYE SURGERY     lasik  . KNEE ARTHROSCOPY Bilateral    Bilat; 2003, 2004, 2010  . NASAL SEPTOPLASTY W/ TURBINOPLASTY  04/14/15  . NASAL SEPTOPLASTY W/ TURBINOPLASTY Bilateral 04/14/2015   Procedure: NASAL SEPTOPLASTY WITH BILATERAL INFERIOR  TURBINATE REDUCTION;  Surgeon: Jerrell Belfast, MD;  Location: [facility];  Service: ENT;  Laterality: Bilateral;  . SHOULDER ARTHROSCOPY Bilateral    Bilat; 1993, 2000, 2007  . WISDOM TOOTH EXTRACTION     young adult   . WRIST FRACTURE SURGERY Left 2010   Hockey injury, plate inserted    Family History  Problem Relation Age of Onset  . Pancreatic cancer Paternal Grandfather   . Lung cancer Paternal Grandfather         (smoker)  . Colon cancer Maternal Grandmother   . Colon polyps Father   . Diverticulitis Mother     Social History   Social History  . Marital status: Married    Spouse name: N/A  . Number of children: 4  . Years of education: N/A   Occupational History  . engineer    Social History Main Topics  . Smoking status: Never Smoker  . Smokeless tobacco: Never Used  . Alcohol use 0.0 oz/week     Comment: beer occasional  . Drug use: No  . Sexual activity: Not on file   Other Topics Concern  . Not on file   Social History Narrative   Married, 4 children from ages 24 yrs to 41 yrs.  No sibs.   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   Grad from [employer] in [place], [place].   Originally from [place], lived in [place] for a while, then relocated to [facility] [place] [facility] [employer] [place]) about 2010.   Exercises irregularly, no particular dietary focus.   No T/A/Ds.    Outpatient Medications Prior to Visit  Medication Sig Dispense Refill  . nitroGLYCERIN (NITRODUR - DOSED IN MG/24 HR) 0.2 mg/hr  patch 1/4 patch daily 30 patch 1  . omeprazole (PRILOSEC) 40 MG capsule Take 1 capsule (40 mg total) by mouth daily. 30 capsule 6  . Vitamin D, Ergocalciferol, (DRISDOL) 50000 units CAPS capsule Take 1 capsule (50,000 Units total) by mouth every 7 (seven) days. 12 capsule 0  . fexofenadine (ALLEGRA) 180 MG tablet Take 1 tablet (180 mg total) by mouth daily. (Patient not taking: Reported on 08/05/2016) 30 tablet 11   Facility-Administered Medications Prior to Visit  Medication Dose Route Frequency Provider Last Rate Last Dose  . 0.9 %  sodium chloride infusion  500 mL Intravenous Continuous Pyrtle, Lajuan Lines, MD        Allergies  Allergen Reactions  . Dayquil Multi-Symptom Cold-Flu  [Pseudoephedrine-Apap-Dm] Hives    ROS Review of Systems  Constitutional: Negative for appetite change, chills, fatigue and fever.  HENT: Negative for congestion, dental problem, ear pain and sore throat.   Eyes: Negative for discharge, redness and visual disturbance.  Respiratory: Negative for cough, chest tightness, shortness of breath and wheezing.   Cardiovascular: Negative for chest pain, palpitations and leg swelling.  Gastrointestinal: Negative for abdominal pain, blood in stool, diarrhea, nausea and vomiting.  Genitourinary: Negative for difficulty urinating, dysuria, flank pain, frequency, hematuria and urgency.  Musculoskeletal: Negative for arthralgias, back pain, joint swelling, myalgias and neck stiffness.  Skin: Negative for pallor and rash.  Neurological: Negative for dizziness, speech difficulty, weakness and headaches.  Hematological: Negative for adenopathy. Does not bruise/bleed easily.  Psychiatric/Behavioral: Negative for confusion and sleep disturbance. The patient is not nervous/anxious.     PE; Blood pressure 128/81, pulse 77, temperature 98.4 F (36.9 C), temperature source Oral, resp. rate 16, height 6\' 1"  (1.854 m), weight 254 lb 8 oz (115.4 kg), SpO2 97 %. Body mass index is 33.58 kg/m.  Gen: Alert, well appearing.  Patient is oriented to person, place, time, and situation. AFFECT: pleasant, lucid thought and speech. ENT: Ears: EACs clear, normal epithelium.  TMs with good light reflex and landmarks bilaterally.  Eyes: no injection, icteris, swelling, or exudate.  EOMI, PERRLA. Nose: no drainage or turbinate edema/swelling.  No injection or focal lesion.  Mouth: lips without lesion/swelling.  Oral mucosa pink and moist.  Dentition intact and without obvious caries or gingival swelling.  Oropharynx without erythema, exudate, or swelling.  Neck: supple/nontender.  No LAD, mass, or TM.  Carotid pulses 2+ bilaterally, without bruits. CV: RRR, no m/r/g.   LUNGS:  CTA bilat, nonlabored resps, good aeration in all lung fields. ABD: soft, NT, ND, BS normal.  No hepatospenomegaly or mass.  No bruits. EXT: no clubbing, cyanosis, or edema.  Musculoskeletal: no joint swelling, erythema, warmth, or tenderness.  ROM of all joints intact. Skin - no sores or suspicious lesions or rashes or color changes Rectal: pt deferred  Pertinent labs:  Lab Results  Component Value Date   TSH 2.24 07/31/2016   Lab Results  Component Value Date   WBC 4.3 07/31/2016   HGB 15.8 07/31/2016   HCT 46.2 07/31/2016   MCV 94.3 07/31/2016   PLT 134.0 (L) 07/31/2016   Lab Results  Component Value Date   CREATININE 1.10 07/31/2016   BUN 13 07/31/2016   NA 139 07/31/2016   K 4.5 07/31/2016   CL 104 07/31/2016   CO2 30 07/31/2016   Lab Results  Component Value Date   ALT 15 07/31/2016   AST 16 07/31/2016   ALKPHOS 88 07/31/2016   BILITOT 0.7 07/31/2016   Lab  Results  Component Value Date   CHOL 232 (H) 07/31/2016   Lab Results  Component Value Date   HDL 33.10 (L) 07/31/2016   Lab Results  Component Value Date   LDLCALC 203 (H) 08/04/2015   Lab Results  Component Value Date   TRIG 261.0 (H) 07/31/2016   Lab Results  Component Value Date   CHOLHDL 7 07/31/2016   Lab Results  Component Value Date   PSA 4.29 (H) 07/31/2016   PSA 3.53 08/04/2015   Lab Results  Component Value Date   HGBA1C 5.8 07/31/2016   ASSESSMENT AND PLAN:   Health maintenance exam: Reviewed age and gender appropriate health maintenance issues (prudent diet, regular exercise, health risks of tobacco and excessive alcohol, use of seatbelts, fire alarms in home, use of sunscreen).  Also reviewed age and gender appropriate health screening as well as vaccine recommendations. Vaccines UTD except HeP A #2--given today. Reviewed recent fasting HP labs in detail.  Push TLC for mild hyperlipidemia and IFG. PSA elevation and increased velocity: refer to urology today. Colon ca  screening: next colonoscopy 11/2018 due to hx of adenomatous colon polyps (3 yr recall).  An After Visit Summary was printed and given to the patient.  FOLLOW UP:  Return in about 1 year (around 08/05/2017) for annual CPE with fasting labs the week prior.  Signed:  Crissie Sickles, MD           08/05/2016

## 2016-08-05 NOTE — Patient Instructions (Signed)

## 2016-08-05 NOTE — Addendum Note (Signed)
Addended by: Onalee Hua on: 08/05/2016 10:46 AM   Modules accepted: Orders

## 2016-08-07 DIAGNOSIS — H401132 Primary open-angle glaucoma, bilateral, moderate stage: Secondary | ICD-10-CM | POA: Diagnosis not present

## 2016-08-16 ENCOUNTER — Encounter: Payer: Self-pay | Admitting: Family Medicine

## 2016-08-21 ENCOUNTER — Ambulatory Visit: Payer: 59 | Admitting: Family Medicine

## 2016-08-25 DIAGNOSIS — R972 Elevated prostate specific antigen [PSA]: Secondary | ICD-10-CM

## 2016-08-25 HISTORY — DX: Elevated prostate specific antigen (PSA): R97.20

## 2016-08-30 ENCOUNTER — Other Ambulatory Visit: Payer: Self-pay | Admitting: Family Medicine

## 2016-08-30 DIAGNOSIS — M9901 Segmental and somatic dysfunction of cervical region: Secondary | ICD-10-CM | POA: Diagnosis not present

## 2016-08-30 DIAGNOSIS — M5032 Other cervical disc degeneration, mid-cervical region, unspecified level: Secondary | ICD-10-CM | POA: Diagnosis not present

## 2016-08-30 DIAGNOSIS — M531 Cervicobrachial syndrome: Secondary | ICD-10-CM | POA: Diagnosis not present

## 2016-08-30 NOTE — Telephone Encounter (Signed)
Refill done.  

## 2016-09-03 ENCOUNTER — Ambulatory Visit (INDEPENDENT_AMBULATORY_CARE_PROVIDER_SITE_OTHER): Payer: 59 | Admitting: Family Medicine

## 2016-09-03 ENCOUNTER — Ambulatory Visit: Payer: Self-pay

## 2016-09-03 ENCOUNTER — Encounter: Payer: Self-pay | Admitting: Family Medicine

## 2016-09-03 VITALS — BP 120/82 | HR 80 | Ht 73.0 in | Wt 257.0 lb

## 2016-09-03 DIAGNOSIS — S43431A Superior glenoid labrum lesion of right shoulder, initial encounter: Secondary | ICD-10-CM | POA: Diagnosis not present

## 2016-09-03 DIAGNOSIS — M79645 Pain in left finger(s): Secondary | ICD-10-CM

## 2016-09-03 DIAGNOSIS — S63622A Sprain of interphalangeal joint of left thumb, initial encounter: Secondary | ICD-10-CM | POA: Diagnosis not present

## 2016-09-03 DIAGNOSIS — M25511 Pain in right shoulder: Secondary | ICD-10-CM

## 2016-09-03 DIAGNOSIS — S63602A Unspecified sprain of left thumb, initial encounter: Secondary | ICD-10-CM | POA: Insufficient documentation

## 2016-09-03 NOTE — Assessment & Plan Note (Signed)
New problem. Patient having a synovitis noted on ultrasound. Worsening symptoms we'll consider injection. We'll try topical anti-inflammatories and bracing at night first.

## 2016-09-03 NOTE — Assessment & Plan Note (Signed)
No improvement with conservative therapy. Patient does have more of a labral tear noted. I do believe then had further evaluation with an MR arthrogram would be beneficial. Patient's does have postsurgical changes of the rotator cuff but did not seen acute tear. Possible intersubstance tearing could be also noted. [facility] imaging would be warranted at this time and patient would be a candidate for surgical intervention. We discussed icing regimen, plus or minus continuing the nitroglycerin. Follow-up with me again after the MRI to discuss further treatment options.

## 2016-09-03 NOTE — Progress Notes (Signed)
[name] [address] [place], [place] [address] Phone: [phone] Subjective:    CC: Right shoulder pain f/u  [id number]  Roy [name] is a 46 y.o. male coming in with complaint of right shoulder pain. Past medical history significant for rotator cuff repair greater than a decade ago.Patient was seen by me and did have more of a shoulder bursitis. Was injected back in April. Patient did make some improvement but not severe enough. Concern for more of a labral pathology. Started on nitroglycerin and was to continue conservative therapy. Patient states no improvement at this time. States that the sharp pain with certain movements has improved but unfortunately the deep throbbing aching is worse.  Patient does have a new injury. Patient states that unfortunately having worsening pain of the left thumb. Caught it on the lawnmower. . Since then for the last 2 weeks some aching pain. Patient states maybe some bruising and swelling that seems to be improving slowly discussed once make sure he does not have a fracture.  Past Medical History:  Diagnosis Date  . Chronic rhinitis    Deviated nasal septum, hypertrophy of nasal turbinates: Dr. Wilburn Cornelia to do inferior nasal turb reduction and septoplasty as of 03/28/15.  . Colitis   . Diverticulosis 2009   Colonoscopy confirmed approx 2009 (diverticulitis x 2 episodes).  [name] GERD (gastroesophageal reflux disease)   . History of adenomatous polyp of colon 12/11/2015   Recall 5 yrs  . Irritable bowel syndrome with diarrhea   . Rotator cuff impingement syndrome 2018   Bilat (Dr. Tamala Julian)  . Sleep concern    study done from a bout 2007- in [place]   . Snoring    Sleep study in remote past was normal per pt report.  . Thrombocytopenia ([place]) 2014   Mild; stable 2014-2016.  Abd u/s 02/2014 NORMAL.   Past Surgical History:  Procedure Laterality Date  . COLONOSCOPY W/ POLYPECTOMY  12/11/2015   Tubular adenoma  x 1, +Diverticulosis and int hem.  Recall 5 yrs.  [name] EYE SURGERY     lasik  . KNEE ARTHROSCOPY Bilateral    Bilat; 2003, 2004, 2010  . NASAL SEPTOPLASTY W/ TURBINOPLASTY  04/14/15  . NASAL SEPTOPLASTY W/ TURBINOPLASTY Bilateral 04/14/2015   Procedure: NASAL SEPTOPLASTY WITH BILATERAL INFERIOR  TURBINATE REDUCTION;  Surgeon: Jerrell Belfast, MD;  Location: [facility];  Service: ENT;  Laterality: Bilateral;  . SHOULDER ARTHROSCOPY Bilateral    Bilat; 1993, 2000, 2007  . WISDOM TOOTH EXTRACTION     young adult  . WRIST FRACTURE SURGERY Left 2010   Hockey injury, plate inserted   Social History   Social History  . Marital status: Married    Spouse name: N/A  . Number of children: 4  . Years of education: N/A   Occupational History  . engineer    Social History Main Topics  . Smoking status: Never Smoker  . Smokeless tobacco: Never Used  . Alcohol use 0.0 oz/week     Comment: beer occasional  . Drug use: No  . Sexual activity: Not Asked   Other Topics Concern  . None   Social History Narrative   Married, 4 children from ages 37 yrs to 55 yrs.  No sibs.   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   Grad from [employer] in [place], [place].   Originally from [place], lived in [place] for a while, then relocated to [facility] [employer] [place]) about 2010.   Exercises irregularly, no particular  dietary focus.   No T/A/Ds.   Allergies  Allergen Reactions  . Dayquil Multi-Symptom Cold-Flu [Pseudoephedrine-Apap-Dm] Hives   Family History  Problem Relation Age of Onset  . Pancreatic cancer Paternal Grandfather   . Lung cancer Paternal Grandfather         (smoker)  . Colon cancer Maternal Grandmother   . Colon polyps Father   . Diverticulitis Mother     Past medical history, social, surgical and family history all reviewed in electronic medical record.  No pertanent information unless stated regarding to the chief complaint.   Review of Systems: No headache, visual changes,  nausea, vomiting, diarrhea, constipation, dizziness, abdominal pain, skin rash, fevers, chills, night sweats, weight loss, swollen lymph nodes, body aches, joint swelling, , chest pain, shortness of breath, mood changes.  Positive muscle aches  Objective  Blood pressure 120/82, pulse 80, height 6\' 1"  (1.854 m), weight 257 lb (116.6 kg), SpO2 96 %.   Systems examined below as of 09/03/16 General: NAD A&O x3 mood, affect normal  HEENT: Pupils equal, extraocular movements intact no nystagmus Respiratory: not short of breath at rest or with speaking Cardiovascular: No lower extremity edema, non tender Skin: Warm dry intact with no signs of infection or rash on extremities or on axial skeleton. Abdomen: Soft nontender, no masses Neuro: Cranial nerves  intact, neurovascularly intact in all extremities with 2+ DTRs and 2+ pulses. Lymph: No lymphadenopathy appreciated today  Gait normal with good balance and coordination.    MSK: Non tender with full range of motion and good stability and symmetric strength and tone of  elbows, wrist,  knee hips and ankles bilaterally.    Left hand exam shows the patient does have some mild tenderness over the IP joint. PCL seems to be intact  Shoulder: Right Inspection reveals no abnormalities, atrophy or asymmetry. Palpation is normal with no tenderness over AC joint or bicipital groove. ROM is full in all planes. Rotator cuff strength normal throughout. Positive impingement Speeds and Yergason's tests normal. Positive labral pathology Normal scapular function observed. No painful arc and no drop arm sign. No apprehension sign Contralateral shoulder unremarkable   Limited muscular skeletal ultrasound was performed and interpreted by Lyndal Pulley  Limited ultrasound patient's left thumb shows a UCL is intact. Patient does have a synovitis noted but no cartilage defect of the bone. Impression: Synovitis of the IP joint.     Impression and  Recommendations:     This case required medical decision making of moderate complexity.      Note: This dictation was prepared with Dragon dictation along with smaller phrase technology. Any transcriptional errors that result from this process are unintentional.

## 2016-09-05 DIAGNOSIS — H401132 Primary open-angle glaucoma, bilateral, moderate stage: Secondary | ICD-10-CM | POA: Diagnosis not present

## 2016-09-06 DIAGNOSIS — M71571 Other bursitis, not elsewhere classified, right ankle and foot: Secondary | ICD-10-CM | POA: Diagnosis not present

## 2016-09-06 DIAGNOSIS — L600 Ingrowing nail: Secondary | ICD-10-CM | POA: Diagnosis not present

## 2016-09-06 DIAGNOSIS — M71572 Other bursitis, not elsewhere classified, left ankle and foot: Secondary | ICD-10-CM | POA: Diagnosis not present

## 2016-09-09 ENCOUNTER — Encounter: Payer: Self-pay | Admitting: Family Medicine

## 2016-09-24 ENCOUNTER — Ambulatory Visit
Admission: RE | Admit: 2016-09-24 | Discharge: 2016-09-24 | Disposition: A | Discharge status: Home / Self Care | Payer: 59 | Source: Ambulatory Visit | Attending: Family Medicine | Admitting: Family Medicine

## 2016-09-24 DIAGNOSIS — M25511 Pain in right shoulder: Secondary | ICD-10-CM | POA: Diagnosis not present

## 2016-09-24 MED ORDER — IOPAMIDOL (ISOVUE-M 200) INJECTION 41%
15.0000 mL | Freq: Once | INTRAMUSCULAR | Status: AC
  Administered 2016-09-24: 15 mL via INTRA_ARTICULAR

## 2016-09-25 ENCOUNTER — Encounter: Payer: Self-pay | Admitting: Family Medicine

## 2016-10-04 ENCOUNTER — Ambulatory Visit: Payer: Self-pay

## 2016-10-04 ENCOUNTER — Encounter: Payer: Self-pay | Admitting: Family Medicine

## 2016-10-04 ENCOUNTER — Ambulatory Visit (INDEPENDENT_AMBULATORY_CARE_PROVIDER_SITE_OTHER): Payer: 59 | Admitting: Family Medicine

## 2016-10-04 VITALS — BP 122/88 | HR 59 | Ht 73.0 in

## 2016-10-04 DIAGNOSIS — S63622D Sprain of interphalangeal joint of left thumb, subsequent encounter: Secondary | ICD-10-CM | POA: Diagnosis not present

## 2016-10-04 DIAGNOSIS — S43431A Superior glenoid labrum lesion of right shoulder, initial encounter: Secondary | ICD-10-CM

## 2016-10-04 NOTE — Progress Notes (Signed)
[name] [address] [place], [place] [address] Phone: [phone] Subjective:    CC: Right shoulder pain f/u  [id number]  Roy [name] is a 46 y.o. male coming in with complaint of right shoulder pain. Past medical history significant for rotator cuff repair greater than a decade ago.Patient was seen by me and did have more of a shoulder bursitis. Was injected back in April. Patient did make some improvement but not severe enough. Concern for more of a labral pathology. Started on nitroglycerin and was to continue conservative therapy. Patient states no improvement Sent for an MRI. MRI digital some fraying of the anterior labrum but otherwise fairly unremarkable.  Left thumb continues to give him pain as well. Patient states not making significant improvement. It is once weekly vitamin D. Hasn't notice any improvement.   Past Medical History:  Diagnosis Date  . Chronic rhinitis    Deviated nasal septum, hypertrophy of nasal turbinates: Dr. Wilburn Cornelia to do inferior nasal turb reduction and septoplasty as of 03/28/15.  . Colitis   . Diverticulosis 2009   Colonoscopy confirmed approx 2009 (diverticulitis x 2 episodes).  [name] GERD (gastroesophageal reflux disease)   . History of adenomatous polyp of colon 12/11/2015   Recall 5 yrs  . Irritable bowel syndrome with diarrhea   . Rotator cuff impingement syndrome 2018   Bilat (Dr. Tamala Julian)  . Sleep concern    study done from a bout 2007- in [place]   . Snoring    Sleep study in remote past was normal per pt report.  . Thrombocytopenia ([place]) 2014   Mild; stable 2014-2016.  Abd u/s 02/2014 NORMAL.   Past Surgical History:  Procedure Laterality Date  . COLONOSCOPY W/ POLYPECTOMY  12/11/2015   Tubular adenoma x 1, +Diverticulosis and int hem.  Recall 5 yrs.  [name] EYE SURGERY     lasik  . KNEE ARTHROSCOPY Bilateral    Bilat; 2003, 2004, 2010  . NASAL SEPTOPLASTY W/ TURBINOPLASTY  04/14/15  . NASAL  SEPTOPLASTY W/ TURBINOPLASTY Bilateral 04/14/2015   Procedure: NASAL SEPTOPLASTY WITH BILATERAL INFERIOR  TURBINATE REDUCTION;  Surgeon: Jerrell Belfast, MD;  Location: [facility];  Service: ENT;  Laterality: Bilateral;  . SHOULDER ARTHROSCOPY Bilateral    Bilat; 1993, 2000, 2007  . WISDOM TOOTH EXTRACTION     young adult  . WRIST FRACTURE SURGERY Left 2010   Hockey injury, plate inserted   Social History   Social History  . Marital status: Married    Spouse name: N/A  . Number of children: 4  . Years of education: N/A   Occupational History  . engineer    Social History Main Topics  . Smoking status: Never Smoker  . Smokeless tobacco: Never Used  . Alcohol use 0.0 oz/week     Comment: beer occasional  . Drug use: No  . Sexual activity: Not Asked   Other Topics Concern  . None   Social History Narrative   Married, 4 children from ages 89 yrs to 66 yrs.  No sibs.   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   Grad from [employer] in [place], [place].   Originally from [place], lived in [place] for a while, then relocated to [facility] [employer] [place]) about 2010.   Exercises irregularly, no particular dietary focus.   No T/A/Ds.   Allergies  Allergen Reactions  . Dayquil Multi-Symptom Cold-Flu [Pseudoephedrine-Apap-Dm] Hives   Family History  Problem Relation Age of Onset  . Pancreatic cancer Paternal Grandfather   .  Lung cancer Paternal Grandfather         (smoker)  . Colon cancer Maternal Grandmother   . Colon polyps Father   . Diverticulitis Mother     Past medical history, social, surgical and family history all reviewed in electronic medical record.  No pertanent information unless stated regarding to the chief complaint.   Review of Systems: No headache, visual changes, nausea, vomiting, diarrhea, constipation, dizziness, abdominal pain, skin rash, fevers, chills, night sweats, weight loss, swollen lymph nodes, body aches, joint swelling, muscle aches, chest  pain, shortness of breath, mood changes.    . Blood pressure 122/88, pulse (!) 59, height 6\' 1"  (1.854 m), SpO2 96 %.   Systems examined below as of 10/05/16 General: NAD A&O x3 mood, affect normal  HEENT: Pupils equal, extraocular movements intact no nystagmus Respiratory: not short of breath at rest or with speaking Cardiovascular: No lower extremity edema, non tender Skin: Warm dry intact with no signs of infection or rash on extremities or on axial skeleton. Abdomen: Soft nontender, no masses Neuro: Cranial nerves  intact, neurovascularly intact in all extremities with 2+ DTRs and 2+ pulses. Lymph: No lymphadenopathy appreciated today  Gait normal with good balance and coordination.  MSK: Non tender with full range of motion and good stability and symmetric strength and tone of , elbows, wrist,  knee hips and ankles bilaterally.    Continued pain and swelling over the left MP joint  Shoulder: Right Inspection reveals no abnormalities, atrophy or asymmetry. Palpation is normal with no tenderness over AC joint or bicipital groove. ROM is full in all planes. Rotator cuff strength normal throughout. No signs of impingement with negative Neer and Hawkin's tests, empty can sign. Speeds and Yergason's tests normal. Positive O'Brien's Normal scapular function observed. No painful arc and no drop arm sign. No apprehension sign   Procedure: Real-time Ultrasound Guided Injection of right glenohumeral joint Device: GE Logiq Q7  Ultrasound guided injection is preferred based studies that show increased duration, increased effect, greater accuracy, decreased procedural pain, increased response rate with ultrasound guided versus blind injection.  Verbal informed consent obtained.  Time-out conducted.  Noted no overlying erythema, induration, or other signs of local infection.  Skin prepped in a sterile fashion.  Local anesthesia: Topical Ethyl chloride.  With sterile technique and under  real time ultrasound guidance:  Joint visualized.  23g 1  inch needle inserted posterior approach. Pictures taken for needle placement. Patient did have injection of 2 cc of 1% lidocaine, 2 cc of 0.5% Marcaine, and 1.0 cc of Kenalog 40 mg/dL. Completed without difficulty  Pain immediately resolved suggesting accurate placement of the medication.  Advised to call if fevers/chills, erythema, induration, drainage, or persistent bleeding.  Images permanently stored and available for review in the ultrasound unit.  Impression: Technically successful ultrasound guided injection.      Impression and Recommendations:     This case required medical decision making of moderate complexity.      Note: This dictation was prepared with Dragon dictation along with smaller phrase technology. Any transcriptional errors that result from this process are unintentional.

## 2016-10-04 NOTE — Patient Instructions (Addendum)
Sorry you are still hurting.  A small labral tear and tried to do injection again For the thumb lets brace day and night for 1 week then nightly for 2 weeks.  K2 over the counter daily for 4 weeks.  See me again in 4 weeks.

## 2016-10-05 NOTE — Assessment & Plan Note (Signed)
Injected again today. Encourage patient to continue the nitroglycerin and the once weekly vitamin D. We discussed icing regimen. Patient wants given more time. Otherwise we will need to consider surgical intervention.

## 2016-10-05 NOTE — Assessment & Plan Note (Signed)
Patient has full range of motion but is continuing to have pain. Neck: Fracture is a possibility but this is of the IP joint. No type of angulation noted in the weakness. Patient will try bracing and likely. Topical anti-inflammatories continue the once weekly vitamin D and we did discuss other over-the-counter medications a could be beneficial. Follow-up. 4 weeks

## 2016-10-12 DIAGNOSIS — M531 Cervicobrachial syndrome: Secondary | ICD-10-CM | POA: Diagnosis not present

## 2016-10-12 DIAGNOSIS — M9901 Segmental and somatic dysfunction of cervical region: Secondary | ICD-10-CM | POA: Diagnosis not present

## 2016-10-12 DIAGNOSIS — M5032 Other cervical disc degeneration, mid-cervical region, unspecified level: Secondary | ICD-10-CM | POA: Diagnosis not present

## 2016-10-14 ENCOUNTER — Telehealth: Payer: Self-pay | Admitting: Family Medicine

## 2016-10-14 DIAGNOSIS — R972 Elevated prostate specific antigen [PSA]: Secondary | ICD-10-CM | POA: Diagnosis not present

## 2016-10-14 NOTE — Telephone Encounter (Signed)
SW pt advised him that [facility] is not part of our system and that he would need to contact them for this blood test. Pt voiced understanding.

## 2016-10-14 NOTE — Telephone Encounter (Signed)
Patient is requesting PSA with reflex blood test per Dr. Louis Meckel at [facility]. Patient would like to have blood draw at [facility]. Is that okay?

## 2016-10-16 ENCOUNTER — Encounter: Payer: Self-pay | Admitting: Family Medicine

## 2016-10-21 DIAGNOSIS — R972 Elevated prostate specific antigen [PSA]: Secondary | ICD-10-CM | POA: Diagnosis not present

## 2016-10-22 DIAGNOSIS — Z9889 Other specified postprocedural states: Secondary | ICD-10-CM | POA: Diagnosis not present

## 2016-10-22 DIAGNOSIS — H469 Unspecified optic neuritis: Secondary | ICD-10-CM | POA: Diagnosis not present

## 2016-10-25 ENCOUNTER — Other Ambulatory Visit: Payer: Self-pay | Admitting: Ophthalmology

## 2016-10-25 DIAGNOSIS — H469 Unspecified optic neuritis: Secondary | ICD-10-CM

## 2016-11-01 ENCOUNTER — Ambulatory Visit: Payer: 59 | Admitting: Family Medicine

## 2016-11-05 ENCOUNTER — Encounter: Payer: Self-pay | Admitting: Internal Medicine

## 2016-11-05 ENCOUNTER — Ambulatory Visit (INDEPENDENT_AMBULATORY_CARE_PROVIDER_SITE_OTHER): Payer: 59 | Admitting: Internal Medicine

## 2016-11-05 VITALS — BP 120/92 | HR 72 | Ht 73.0 in | Wt 256.0 lb

## 2016-11-05 DIAGNOSIS — J302 Other seasonal allergic rhinitis: Secondary | ICD-10-CM | POA: Diagnosis not present

## 2016-11-05 DIAGNOSIS — Z6831 Body mass index (BMI) 31.0-31.9, adult: Secondary | ICD-10-CM

## 2016-11-05 DIAGNOSIS — G4733 Obstructive sleep apnea (adult) (pediatric): Secondary | ICD-10-CM | POA: Diagnosis not present

## 2016-11-05 MED ORDER — AZELASTINE-FLUTICASONE 137-50 MCG/ACT NA SUSP: 1.0000 | Freq: Two times a day (BID) | NASAL | 11 refills | Status: DC

## 2016-11-05 NOTE — Patient Instructions (Addendum)
Please see patient coordinator before you leave today  to schedule split night study-  if your insurance won't cover it,   you may need a home study first to prove you have it    After your study I will review the results and call with recommendations and directed follow up   Try dymista one twice daily and call for allergy referral if not happy with end

## 2016-11-05 NOTE — Progress Notes (Signed)
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 SLEEP CONSULT    Referred by Dr. Ernestine Conrad. Pt c/o fatigue, daytime somnolence and snoring. Pt reports a 25+lb weight gain in the past 6 months. Pt has previously had a sleep study around 10 yrs ago that was essentially normal.     Sleep Questionnaire:  What time do you typically go to bed?   65mn   How long does it take you to fall asleep?  1 m  How many times during the night do you wake up?   2 What time do you get out of bed to start your day?  7a Do you drive or operate heavy machinery in your occupation?   no  How much has your weight changed (up or down) over the past two years?   Up 20  Have you ever had a sleep study before?   yes    If yes, location of study?   [place]   If yes, date of study?   10 y prior to OV    Do you currently use CPAP?   No, did not qualify If so, what pressure?   n/a   Do you wear oxygen at any time?  no Epworth score   14  Wakes up feeling more  tired and wife says snoring more since wt gain / no problem driving short distances  rec Sleep study Allergy profile >  Pos grass/dust  IgE  198    03/09/2015  f/u ov/Roy [name] re: allergic rhinitis poor control on flonase with freq sinus infection  Chief Complaint  Patient presents with  . Follow-up    Pt states here to review labs. He only c/o minimal nasal congestion.   maint rx = flonase and singulair  rec Try dymista one twice daily  If you need an allergist I recommend Dr Bruna Potter group strongly.    03/2015  nasal surgery/ shomaker > less tendency to infections    11/05/2016  f/u ov/Roy [name] re: allergic rhinitis/ hypersomnolence  Chief Complaint  Patient presents with  . Acute Visit    Pt states wanting PSG done- he is feeling very tired during the day and also snoring.   spring > fall itching/ sneezing but really year round nasal congestion despite nasal surgery 03/2015 and does not recall response to dymista but that was during the time he was having repeated sinus infections which is no longer an issue  In terms of hypersomnolence,  Epworth score now 22 assoc with wt gain- wakes up feeling tired s ha but now also with borderline hbp   No obvious day to day or daytime variability or assoc excess/ purulent sputum or mucus plugs or hemoptysis or cp or chest tightness, subjective wheeze or overt sinus or hb symptoms. No unusual exp hx or h/o childhood pna/ asthma or knowledge of premature birth.  Sleeping ok flat without nocturnal  or early am exacerbation  of respiratory  c/o's or need for noct saba. Also denies any obvious fluctuation of  symptoms with weather or environmental changes or other aggravating or alleviating factors except as outlined above   Current Allergies, Complete Past Medical History, Past Surgical History, Family History, and Social History were reviewed in [employer] record.  ROS  The following are not active complaints unless bolded sore throat, dysphagia, dental problems, itching, sneezing,  nasal congestion or disharge of excess mucus or purulent secretions, ear ache,   fever, chills, sweats, unintended wt loss or wt gain, classically pleuritic or exertional cp,  orthopnea pnd or leg swelling, presyncope, palpitations, abdominal pain, anorexia, nausea, vomiting, diarrhea  or change in bowel habits or bladder habits, change in stools or change in urine, dysuria, hematuria,  rash, arthralgias, visual complaints, headache, numbness, weakness or ataxia or  problems with walking or coordination,  change in mood/affect or memory.        Current Meds  Medication Sig  . latanoprost (XALATAN) 0.005 % ophthalmic solution Place 1 drop into both eyes at bedtime.  [name] loratadine (ALLERGY) 10 MG tablet Take 10 mg by mouth daily.  [name] omeprazole (PRILOSEC) 40 MG capsule Take 1 capsule (40 mg total) by mouth daily.  . Vitamin D, Ergocalciferol, (DRISDOL) 50000 units CAPS capsule TAKE ONE CAPSULE BY MOUTH EVERY 7 DAYS         Objective:   Physical Exam   amb wm nad/ mild nasal tone to voice   11/05/2016       256  03/09/2015       252   02/16/15 253 lb 3.2 oz (114.851 kg)  01/25/15 253 lb (114.76 kg)  07/29/14 231 lb (104.781 kg)    Vital signs reviewed  - Note on arrival 02 sats  97% on RA and bp 120/92      HEENT: nl dentition, and oropharynx. Nl external ear canals without cough reflex - moderate bilateral non-specific turbinate edema R > L   Modified Mallampati Score = 1   NECK :  without JVD/Nodes/TM/ nl carotid upstrokes bilaterally   LUNGS: no acc muscle use,  Nl contour chest which is clear to A and P bilaterally without cough on insp or exp maneuvers   CV:  RRR  no s3 or murmur or increase in P2, no edema   ABD:  soft and nontender with nl inspiratory excursion in the supine position. No bruits or organomegaly, bowel sounds nl  MS:  Nl gait/ ext warm without deformities, calf tenderness, cyanosis or clubbing No obvious joint restrictions   SKIN: warm and dry without lesions    NEURO:  alert, approp, nl sensorium with  no motor deficits    Lab Results  Component Value Date   TSH 2.79 01/25/2015        Assessment:           Outpatient Encounter Prescriptions as of 11/05/2016  Medication Sig  . latanoprost (XALATAN) 0.005 % ophthalmic solution Place 1 drop into both eyes at bedtime.  [name] loratadine (ALLERGY) 10 MG tablet Take 10 mg by mouth daily.  [name] omeprazole (PRILOSEC) 40 MG capsule Take 1 capsule (40 mg total) by  mouth daily.  . Vitamin D, Ergocalciferol, (DRISDOL) 50000 units CAPS capsule TAKE ONE CAPSULE BY MOUTH EVERY 7 DAYS  . Azelastine-Fluticasone (DYMISTA) 137-50 MCG/ACT SUSP Place 1 puff into the nose 2 (two) times daily.  . [DISCONTINUED] nitroGLYCERIN (NITRODUR - DOSED IN MG/24 HR) 0.2 mg/hr patch 1/4 patch daily  . [DISCONTINUED] 0.9 %  sodium chloride infusion    No facility-administered  encounter medications on file as of 11/05/2016.

## 2016-11-06 ENCOUNTER — Encounter: Payer: Self-pay | Admitting: Internal Medicine

## 2016-11-06 NOTE — Assessment & Plan Note (Signed)
Allergy profile 02/16/15 >  Eos 0.1/ IgE 198  Grass/ trees/ dust  - 03/09/2015 trial of dymista    - 11/05/2016 rechallenge with dymista and refer to allergy prn

## 2016-11-06 NOTE — Assessment & Plan Note (Signed)
Complicated by borderline hbp/ prob osa  Body mass index is 33.78 kg/m.  -  trending up Lab Results  Component Value Date   TSH 2.24 07/31/2016     Contributing to gerd risk/ doe/reviewed the need and the process to achieve and maintain neg calorie balance > defer f/u primary care including intermittently monitoring thyroid status

## 2016-11-06 NOTE — Assessment & Plan Note (Addendum)
Split night study 02/16/2015 > pt postponed  - nasal surgery 03/2015 shoemaker  - 11/05/2016  Epworth = 22  Vs 14 02/16/15 ov  - split night study 11/05/2016 >>>   Note assoc borderline hbp and worsening Epworth so rec proceed with split night study and f/u with sleep medicine.   .Etiology and pathophysiology of osa including relationship to obesity reviewed in detail    I had an extended discussion with the patient reviewing all relevant studies completed to date and  lasting 15 to 20 minutes of a 25 minute visit    Each maintenance medication was reviewed in detail including most importantly the difference between maintenance and prns and under what circumstances the prns are to be triggered using an action plan format that is not reflected in the computer generated alphabetically organized AVS.    Please see AVS for specific instructions unique to this visit that I personally wrote and verbalized to the the pt in detail and then reviewed with pt  by my nurse highlighting any  changes in therapy recommended at today's visit to their plan of care.

## 2016-11-11 ENCOUNTER — Ambulatory Visit
Admission: RE | Admit: 2016-11-11 | Discharge: 2016-11-11 | Disposition: A | Discharge status: Home / Self Care | Payer: 59 | Source: Ambulatory Visit | Attending: Ophthalmology | Admitting: Ophthalmology

## 2016-11-11 DIAGNOSIS — H462 Nutritional optic neuropathy: Secondary | ICD-10-CM | POA: Diagnosis not present

## 2016-11-11 DIAGNOSIS — H469 Unspecified optic neuritis: Secondary | ICD-10-CM

## 2016-11-11 MED ORDER — GADOBENATE DIMEGLUMINE 529 MG/ML IV SOLN
20.0000 mL | Freq: Once | INTRAVENOUS | Status: AC | PRN
  Administered 2016-11-11: 20 mL via INTRAVENOUS

## 2016-11-19 DIAGNOSIS — H469 Unspecified optic neuritis: Secondary | ICD-10-CM | POA: Diagnosis not present

## 2016-11-19 DIAGNOSIS — Z9889 Other specified postprocedural states: Secondary | ICD-10-CM | POA: Diagnosis not present

## 2016-11-20 DIAGNOSIS — M5032 Other cervical disc degeneration, mid-cervical region, unspecified level: Secondary | ICD-10-CM | POA: Diagnosis not present

## 2016-11-20 DIAGNOSIS — M531 Cervicobrachial syndrome: Secondary | ICD-10-CM | POA: Diagnosis not present

## 2016-11-20 DIAGNOSIS — M9901 Segmental and somatic dysfunction of cervical region: Secondary | ICD-10-CM | POA: Diagnosis not present

## 2016-11-21 ENCOUNTER — Other Ambulatory Visit: Payer: Self-pay | Admitting: Family Medicine

## 2016-11-22 ENCOUNTER — Other Ambulatory Visit: Payer: Self-pay | Admitting: Internal Medicine

## 2016-11-22 ENCOUNTER — Telehealth: Payer: Self-pay

## 2016-11-22 DIAGNOSIS — G4733 Obstructive sleep apnea (adult) (pediatric): Secondary | ICD-10-CM

## 2016-11-22 NOTE — Telephone Encounter (Signed)
Called and spoke with patient informing that insurance has denied the inlab sleep study. Patient agreed to complete in home sleep study, spoke with Golden Circle in [facility] earlier in the day today regarding this matter. Patient is waiting at this time to schedule an appointment the HST.

## 2016-12-03 ENCOUNTER — Encounter: Payer: Self-pay | Admitting: Family Medicine

## 2016-12-03 ENCOUNTER — Ambulatory Visit (INDEPENDENT_AMBULATORY_CARE_PROVIDER_SITE_OTHER): Payer: 59 | Admitting: Family Medicine

## 2016-12-03 VITALS — BP 108/82 | HR 80 | Ht 73.0 in | Wt 256.0 lb

## 2016-12-03 DIAGNOSIS — S43431A Superior glenoid labrum lesion of right shoulder, initial encounter: Secondary | ICD-10-CM

## 2016-12-03 NOTE — Progress Notes (Signed)
[name] [address] [place], [place] [address] Phone: [phone] Subjective:     CC: Right shoulder pain follow-up  [id number]  Roy [name] is a 46 y.o. male coming in for follow up for right shoulder pain. He tried to work out last week and had some soreness. He said that 2 days later he was in a lot of pain. That past few days he has felt some improvement. He is wondering where he can go from here as he is unable to workout.   Patient has had significant workup for this previously. Patient has undergone 2 rounds of formal physical therapy, as well as multiple injections with no significant improvement. Patient does have fraying of the anterior aspect of the labrum. This is independent living visualized by me and does show patient having some mild irritation in the area. I do believe that this is a true small tear.    Past Medical History:  Diagnosis Date  . Chronic rhinitis    Deviated nasal septum, hypertrophy of nasal turbinates: Dr. Wilburn Cornelia to do inferior nasal turb reduction and septoplasty as of 03/28/15.  . Colitis   . Diverticulosis 2009   Colonoscopy confirmed approx 2009 (diverticulitis x 2 episodes).  . Elevated PSA 08/2016   Urol eval: plan is to repeat PSA's to follow trend (no bx as of 09/2016)  . GERD (gastroesophageal reflux disease)   . History of adenomatous polyp of colon 12/11/2015   Recall 5 yrs  . Irritable bowel syndrome with diarrhea   . Rotator cuff impingement syndrome 2018   Bilat (Dr. Tamala Julian)  . Sleep concern    study done from a bout 2007- in [place]   . Snoring    Sleep study in remote past was normal per pt report.  . Thrombocytopenia ([place]) 2014   Mild; stable 2014-2016.  Abd u/s 02/2014 NORMAL.   Past Surgical History:  Procedure Laterality Date  . COLONOSCOPY W/ POLYPECTOMY  12/11/2015   Tubular adenoma x 1, +Diverticulosis and int hem.  Recall 5 yrs.  [name] EYE SURGERY     lasik  . KNEE  ARTHROSCOPY Bilateral    Bilat; 2003, 2004, 2010  . NASAL SEPTOPLASTY W/ TURBINOPLASTY  04/14/15  . NASAL SEPTOPLASTY W/ TURBINOPLASTY Bilateral 04/14/2015   Procedure: NASAL SEPTOPLASTY WITH BILATERAL INFERIOR  TURBINATE REDUCTION;  Surgeon: Jerrell Belfast, MD;  Location: [facility];  Service: ENT;  Laterality: Bilateral;  . SHOULDER ARTHROSCOPY Bilateral    Bilat; 1993, 2000, 2007  . WISDOM TOOTH EXTRACTION     young adult  . WRIST FRACTURE SURGERY Left 2010   Hockey injury, plate inserted   Social History   Social History  . Marital status: Married    Spouse name: N/A  . Number of children: 4  . Years of education: N/A   Occupational History  . engineer    Social History Main Topics  . Smoking status: Never Smoker  . Smokeless tobacco: Never Used  . Alcohol use 0.0 oz/week     Comment: beer occasional  . Drug use: No  . Sexual activity: Not Asked   Other Topics Concern  . None   Social History Narrative   Married, 4 children from ages 58 yrs to 29 yrs.  No sibs.   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   Grad from [employer] in [place], [place].   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  Exercises irregularly, no particular dietary focus.   No T/A/Ds.   Allergies  Allergen Reactions  . Dayquil Multi-Symptom Cold-Flu [Pseudoephedrine-Apap-Dm] Hives   Family History  Problem Relation Age of Onset  . Pancreatic cancer Paternal Grandfather   . Lung cancer Paternal Grandfather         (smoker)  . Colon cancer Maternal Grandmother   . Colon polyps Father   . Diverticulitis Mother      Past medical history, social, surgical and family history all reviewed in electronic medical record.  No pertanent information unless stated regarding to the chief complaint.   Review of Systems:Review of systems updated and as accurate as of 12/03/16  No headache, visual changes, nausea, vomiting,  diarrhea, constipation, dizziness, abdominal pain, skin rash, fevers, chills, night sweats, weight loss, swollen lymph nodes, body aches, joint swelling, muscle aches, chest pain, shortness of breath, mood changes.   Objective  Blood pressure 108/82, pulse 80, height 6\' 1"  (1.854 m), weight 256 lb (116.1 kg), SpO2 98 %. Systems examined below as of 12/03/16   General: No apparent distress alert and oriented x3 mood and affect normal, dressed appropriately.  HEENT: Pupils equal, extraocular movements intact  Respiratory: Patient's speak in full sentences and does not appear short of breath  Cardiovascular: No lower extremity edema, non tender, no erythema  Skin: Warm dry intact with no signs of infection or rash on extremities or on axial skeleton.  Abdomen: Soft nontender  Neuro: Cranial nerves II through XII are intact, neurovascularly intact in all extremities with 2+ DTRs and 2+ pulses.  Lymph: No lymphadenopathy of posterior or anterior cervical chain or axillae bilaterally.  Gait normal with good balance and coordination.  MSK:  Non tender with full range of motion and good stability and symmetric strength and tone of  elbows, wrist, hip, knee and ankles bilaterally.  Shoulder: Right Inspection reveals no abnormalities, atrophy or asymmetry. Palpation is normal with no tenderness over AC joint or bicipital groove. ROM is full in all planes. Rotator cuff strength normal throughout. Mild positive impingement with Neer and Hawkins Speeds and Yergason's tests mild positive which is new. Positive O'Brien's with a positive clunk Normal scapular function observed. Mild painful arc No apprehension sign  Contralateral shoulder unremarkable     Impression and Recommendations:     This case required medical decision making of moderate complexity.      Note: This dictation was prepared with Dragon dictation along with smaller phrase technology. Any transcriptional errors that result  from this process are unintentional.

## 2016-12-03 NOTE — Assessment & Plan Note (Signed)
Patient is failed all conservative therapy. Attempting injections, formal physical therapy, nitroglycerin patches were no significant improvement. Patient's MRI arthrogram was read as more of a fraying of the anterior labrum but I do think there is a possibility for a tear. Patient has had surgical intervention on the shoulder previously. I do feel that further evaluation by a orthopedic surgeon at this time would be beneficial. Patient will be referred today. We discussed icing regimen and continuing everything else at this time. Patient will avoid heavy lifting for now.

## 2016-12-07 ENCOUNTER — Encounter (HOSPITAL_BASED_OUTPATIENT_CLINIC_OR_DEPARTMENT_OTHER): Payer: 59

## 2016-12-12 DIAGNOSIS — G4733 Obstructive sleep apnea (adult) (pediatric): Secondary | ICD-10-CM | POA: Diagnosis not present

## 2016-12-13 ENCOUNTER — Encounter: Payer: Self-pay | Admitting: Family Medicine

## 2016-12-16 DIAGNOSIS — S43431A Superior glenoid labrum lesion of right shoulder, initial encounter: Secondary | ICD-10-CM | POA: Diagnosis not present

## 2016-12-18 DIAGNOSIS — G4733 Obstructive sleep apnea (adult) (pediatric): Secondary | ICD-10-CM | POA: Diagnosis not present

## 2016-12-19 ENCOUNTER — Encounter: Payer: Self-pay | Admitting: Family Medicine

## 2016-12-27 ENCOUNTER — Telehealth: Payer: Self-pay | Admitting: Internal Medicine

## 2016-12-27 DIAGNOSIS — M5032 Other cervical disc degeneration, mid-cervical region, unspecified level: Secondary | ICD-10-CM | POA: Diagnosis not present

## 2016-12-27 DIAGNOSIS — M531 Cervicobrachial syndrome: Secondary | ICD-10-CM | POA: Diagnosis not present

## 2016-12-27 DIAGNOSIS — G4733 Obstructive sleep apnea (adult) (pediatric): Secondary | ICD-10-CM

## 2016-12-27 DIAGNOSIS — M9901 Segmental and somatic dysfunction of cervical region: Secondary | ICD-10-CM | POA: Diagnosis not present

## 2016-12-27 NOTE — Telephone Encounter (Signed)
MW pt is calling for his HST results.  Please advise. Thanks

## 2016-12-27 NOTE — Telephone Encounter (Signed)
I have not received it - see if we can track it down and I will call him p I have a chance to review but likely first of week of 11/5 at earliest

## 2016-12-30 ENCOUNTER — Other Ambulatory Visit: Payer: Self-pay | Admitting: *Deleted

## 2016-12-30 DIAGNOSIS — G4733 Obstructive sleep apnea (adult) (pediatric): Secondary | ICD-10-CM

## 2016-12-31 DIAGNOSIS — G8918 Other acute postprocedural pain: Secondary | ICD-10-CM | POA: Diagnosis not present

## 2016-12-31 DIAGNOSIS — S43431A Superior glenoid labrum lesion of right shoulder, initial encounter: Secondary | ICD-10-CM | POA: Diagnosis not present

## 2016-12-31 DIAGNOSIS — M7541 Impingement syndrome of right shoulder: Secondary | ICD-10-CM | POA: Diagnosis not present

## 2016-12-31 DIAGNOSIS — M24111 Other articular cartilage disorders, right shoulder: Secondary | ICD-10-CM | POA: Diagnosis not present

## 2016-12-31 NOTE — Telephone Encounter (Signed)
Will forward to LR to follow up on HST.

## 2016-12-31 NOTE — Telephone Encounter (Signed)
Pt is aware of results and voiced his understanding.  Pt has been scheduled for consult with RA on 01/31/17. Titration has been ordered. Nothing further needed.

## 2016-12-31 NOTE — Telephone Encounter (Signed)
Pos for moderate sleep apnea so next step is cpap titration and f/u with Elsworth Soho, first available.   If doesn't want to come in for this the other option in auto cpap trial and then f/u with Elsworth Soho first available

## 2016-12-31 NOTE — Telephone Encounter (Signed)
Result is in there under procedures

## 2017-01-02 ENCOUNTER — Encounter: Payer: Self-pay | Admitting: Family Medicine

## 2017-01-10 ENCOUNTER — Other Ambulatory Visit: Payer: Self-pay | Admitting: Family Medicine

## 2017-01-15 DIAGNOSIS — M25511 Pain in right shoulder: Secondary | ICD-10-CM | POA: Diagnosis not present

## 2017-01-15 DIAGNOSIS — M25611 Stiffness of right shoulder, not elsewhere classified: Secondary | ICD-10-CM | POA: Diagnosis not present

## 2017-01-21 ENCOUNTER — Ambulatory Visit: Payer: 59 | Attending: Internal Medicine | Admitting: Pulmonary Disease

## 2017-01-21 DIAGNOSIS — R0683 Snoring: Secondary | ICD-10-CM | POA: Diagnosis not present

## 2017-01-21 DIAGNOSIS — M25511 Pain in right shoulder: Secondary | ICD-10-CM | POA: Diagnosis not present

## 2017-01-21 DIAGNOSIS — G4730 Sleep apnea, unspecified: Secondary | ICD-10-CM | POA: Diagnosis present

## 2017-01-21 DIAGNOSIS — G4733 Obstructive sleep apnea (adult) (pediatric): Secondary | ICD-10-CM | POA: Insufficient documentation

## 2017-01-21 DIAGNOSIS — M25611 Stiffness of right shoulder, not elsewhere classified: Secondary | ICD-10-CM | POA: Diagnosis not present

## 2017-01-23 DIAGNOSIS — R972 Elevated prostate specific antigen [PSA]: Secondary | ICD-10-CM | POA: Diagnosis not present

## 2017-01-24 ENCOUNTER — Telehealth: Payer: Self-pay | Admitting: Internal Medicine

## 2017-01-24 ENCOUNTER — Other Ambulatory Visit: Payer: Self-pay | Admitting: Internal Medicine

## 2017-01-24 DIAGNOSIS — G4733 Obstructive sleep apnea (adult) (pediatric): Secondary | ICD-10-CM | POA: Diagnosis not present

## 2017-01-24 DIAGNOSIS — M25511 Pain in right shoulder: Secondary | ICD-10-CM | POA: Diagnosis not present

## 2017-01-24 DIAGNOSIS — M25611 Stiffness of right shoulder, not elsewhere classified: Secondary | ICD-10-CM | POA: Diagnosis not present

## 2017-01-24 NOTE — Procedures (Signed)
Patient Name: Roy, [name] Date: 01/21/2017 Gender: Male D.O.B: 04-09-1970 Age (years): 46 Referring Provider: Tanda Rockers Height (inches): 73 Interpreting Physician: Kara Mead MD, ABSM Weight (lbs): 256 RPSGT: Rosebud Poles BMI: 34 MRN: [id number] Neck Size: 19.00   CLINICAL INFORMATION The patient is referred for a CPAP titration to treat sleep apnea.  Date of  HST: 11/2016, moderate OSA with AHI 19/h & lowest desaturation 81%  SLEEP STUDY TECHNIQUE As per the AASM Manual for the Scoring of Sleep and Associated Events v2.3 (April 2016) with a hypopnea requiring 4% desaturations.  The channels recorded and monitored were frontal, central and occipital EEG, electrooculogram (EOG), submentalis EMG (chin), nasal and oral airflow, thoracic and abdominal wall motion, anterior tibialis EMG, snore microphone, electrocardiogram, and pulse oximetry. Continuous positive airway pressure (CPAP) was initiated at the beginning of the study and titrated to treat sleep-disordered breathing.  MEDICATIONS Medications self-administered by patient taken the night of the study : N/A  TECHNICIAN COMMENTS Comments added by technician: Patient tolerated CPAP very well. Patient seemed very anxious prior to LIGHTS OFF time. Patient was desensitize for 30-45 minutes before hook up time. CPAP titratiion increased to 6 cm of H20 as the Optimal pressure. Questionable Bruxism noticed. Patient developed PLMS during later part of study   RESPIRATORY PARAMETERS Optimal PAP Pressure (cm): 6 AHI at Optimal Pressure (/hr): 1.0 Overall Minimal O2 (%): 87.00 Supine % at Optimal Pressure (%): 92 Minimal O2 at Optimal Pressure (%): 90.0     SLEEP ARCHITECTURE The study was initiated at 11:26:08 PM and ended at 6:15:32 AM.  Sleep onset time was 6.2 minutes and the sleep efficiency was 86.9%. The total sleep time was 355.7 minutes.  The patient spent 0.28% of the night in stage N1 sleep, 28.87%  in stage N2 sleep, 51.17% in stage N3 and 19.68% in REM.Stage REM latency was 59.0 minutes  Wake after sleep onset was 47.5. Alpha intrusion was absent. Supine sleep was 76.24%.  CARDIAC DATA The 2 lead EKG demonstrated sinus rhythm. The mean heart rate was N/A beats per  minute. Other EKG findings include: None.   LEG MOVEMENT DATA The total Periodic Limb Movements of Sleep (PLMS) were 94. The PLMS index was 15.86. A PLMS index of <15 is considered normal in adults.  IMPRESSIONS - The optimal PAP pressure was 6 cm of water. - Central sleep apnea was not noted during this titration (CAI = 0.8/h). - Severe oxygen desaturations were observed during this titration (min O2 = 87.00%). - The patient snored with soft snoring volume during this titration study. - No cardiac abnormalities were observed during this study. - Mild periodic limb movements were observed during this study. Arousals associated with PLMs were significant.   DIAGNOSIS - Obstructive Sleep Apnea (327.23 [G47.33 ICD-10])    RECOMMENDATIONS - Trial of CPAP therapy on 6 cm H2O with a Large size Fisher&Paykel Nasal Mask Eson mask and heated humidification. - Avoid alcohol, sedatives and other CNS depressants that may worsen sleep apnea and disrupt normal sleep architecture. - Sleep hygiene should be reviewed to assess factors that may improve sleep quality. - Weight management and regular exercise should be initiated or continued. - Return to Sleep Center for re-evaluation after 4 weeks of therapy    Kara Mead MD Board Certified in [place]

## 2017-01-24 NOTE — Progress Notes (Signed)
LMTCB

## 2017-01-24 NOTE — Telephone Encounter (Signed)
Spoke with pt and notified of PSG results  Order sent to [facility] and he is aware to call for 31-90 f/u her with RA once he starts CPAP

## 2017-01-25 ENCOUNTER — Encounter: Payer: Self-pay | Admitting: Family Medicine

## 2017-01-27 DIAGNOSIS — M25611 Stiffness of right shoulder, not elsewhere classified: Secondary | ICD-10-CM | POA: Diagnosis not present

## 2017-01-27 DIAGNOSIS — M25511 Pain in right shoulder: Secondary | ICD-10-CM | POA: Diagnosis not present

## 2017-01-29 DIAGNOSIS — J019 Acute sinusitis, unspecified: Secondary | ICD-10-CM | POA: Diagnosis not present

## 2017-01-31 ENCOUNTER — Institutional Professional Consult (permissible substitution): Payer: 59 | Admitting: Pulmonary Disease

## 2017-01-31 DIAGNOSIS — M25611 Stiffness of right shoulder, not elsewhere classified: Secondary | ICD-10-CM | POA: Diagnosis not present

## 2017-01-31 DIAGNOSIS — M25511 Pain in right shoulder: Secondary | ICD-10-CM | POA: Diagnosis not present

## 2017-02-05 ENCOUNTER — Encounter: Payer: Self-pay | Admitting: Family Medicine

## 2017-02-05 DIAGNOSIS — M5032 Other cervical disc degeneration, mid-cervical region, unspecified level: Secondary | ICD-10-CM | POA: Diagnosis not present

## 2017-02-05 DIAGNOSIS — M9901 Segmental and somatic dysfunction of cervical region: Secondary | ICD-10-CM | POA: Diagnosis not present

## 2017-02-05 DIAGNOSIS — M531 Cervicobrachial syndrome: Secondary | ICD-10-CM | POA: Diagnosis not present

## 2017-02-07 DIAGNOSIS — M25611 Stiffness of right shoulder, not elsewhere classified: Secondary | ICD-10-CM | POA: Diagnosis not present

## 2017-02-07 DIAGNOSIS — M25511 Pain in right shoulder: Secondary | ICD-10-CM | POA: Diagnosis not present

## 2017-02-11 ENCOUNTER — Telehealth: Payer: Self-pay | Admitting: Internal Medicine

## 2017-02-11 DIAGNOSIS — M25511 Pain in right shoulder: Secondary | ICD-10-CM | POA: Diagnosis not present

## 2017-02-11 DIAGNOSIS — M25611 Stiffness of right shoulder, not elsewhere classified: Secondary | ICD-10-CM | POA: Diagnosis not present

## 2017-02-11 NOTE — Telephone Encounter (Addendum)
Spoke with pt, he has 2 questions  He has not received his cpap yet. I called [facility] and they are still processing the order. She explained at the end of the year it gets busy due to people trying to get a CPAP with their met deductible.   Pt also stated he had a cpap titration and the sleep lab sent him a bill for over 3,000 dollars. Can we check to see what happened? He stated he met his deductible before the test. I am not sure if these need pre-cert before testing. I asked Golden Circle if she can check on this.

## 2017-02-12 ENCOUNTER — Encounter (HOSPITAL_BASED_OUTPATIENT_CLINIC_OR_DEPARTMENT_OTHER): Payer: 59

## 2017-02-12 NOTE — Telephone Encounter (Signed)
Spoke to pt he is aware [facility] did deny this cpap study I have faxed all records for an appeal to [facility] pt is aware and will wait to hear from them Roy [name]

## 2017-02-12 NOTE — Telephone Encounter (Signed)
lmtcb Sally E Ottinger ° °

## 2017-02-19 DIAGNOSIS — G4733 Obstructive sleep apnea (adult) (pediatric): Secondary | ICD-10-CM | POA: Diagnosis not present

## 2017-03-13 ENCOUNTER — Telehealth: Payer: Self-pay | Admitting: Internal Medicine

## 2017-03-13 NOTE — Telephone Encounter (Signed)
Spoke with Kathlee Nations. She states that she can speak to the pt about this.  Spoke with pt and made him aware of this.  Will route message to Kathlee Nations so that she may call the pt.

## 2017-03-13 NOTE — Telephone Encounter (Signed)
Reviewed patient's billing, [employer] did deny his sleep study for medical necessity and the balance was transferred to the patient.  I sent a request to the billing department that they transfer the balance back to insurance and file an appeal.  I called the patient, left message on VM updating him on the steps I had taken.

## 2017-03-22 DIAGNOSIS — G4733 Obstructive sleep apnea (adult) (pediatric): Secondary | ICD-10-CM | POA: Diagnosis not present

## 2017-03-25 ENCOUNTER — Ambulatory Visit (INDEPENDENT_AMBULATORY_CARE_PROVIDER_SITE_OTHER): Payer: 59 | Admitting: Pulmonary Disease

## 2017-03-25 ENCOUNTER — Encounter: Payer: Self-pay | Admitting: Pulmonary Disease

## 2017-03-25 DIAGNOSIS — Z6831 Body mass index (BMI) 31.0-31.9, adult: Secondary | ICD-10-CM

## 2017-03-25 DIAGNOSIS — G4733 Obstructive sleep apnea (adult) (pediatric): Secondary | ICD-10-CM

## 2017-03-25 NOTE — Assessment & Plan Note (Signed)
Weight loss with goal around 230 pounds recommend.

## 2017-03-25 NOTE — Assessment & Plan Note (Signed)
mild obstructive sleep apnea, severe in the supine position . CPAP  is set at 6 cm and seems to control these events  expectation is that you use this for 4-6 hours every night Weight loss with goal of 230 pounds or lower recommended  Weight loss encouraged, compliance with goal of at least 4-6 hrs every night is the expectation. Advised against medications with sedative side effects Cautioned against driving when sleepy - understanding that sleepiness will vary on a day to day basis

## 2017-03-25 NOTE — Patient Instructions (Signed)
You have mild obstructive sleep apnea, severe in the supine position as we discussed. CPAP  is set at 6 cm and seems to control these events  expectation is that you use this for 4-6 hours every night Weight loss with goal of 230 pounds or lower recommended

## 2017-03-25 NOTE — Progress Notes (Signed)
Subjective:    Patient ID: Roy [name], male    DOB: 17-Jan-1971, 47 y.o.   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 He had a sleep study in 2006 at [place] which was negative. He presented with loud snoring, excessive daytime somnolence and fatigue and underwent home sleep study 12/2016 which showed moderate OSA about 19/hour on average.  On review this shows that events were mild on his side with severe AHI 32/hour in the supine position  he was started on CPAP 6 cm after titration study, during the study the used medium full facemask and he has settled on nasal pillows and seems to be tolerating this very well.  He has noted some improvement in his daytime somnolence and fatigue.  Wife has noticed that snoring is completely resolved.  CPAP download was reviewed which shows excellent compliance about 6 hours per night with good control of events and no leak on 6 cm.  He reports occasional nasal dryness.  Epworth sleepiness score is 5. Bedtime is around midnight, sleeps on his left side preferably with 2 pillows, reports 1 nocturnal awakening including nocturia and is out of bed by 7 AM feeling rested without headaches or dryness of mouth.  On weekends he will stay in bed until 8:30 AM There is no history suggestive of cataplexy, sleep paralysis or parasomnias  He has gained significant weight to his current weight of 260 pounds   Past Medical History:  Diagnosis Date  . Chronic rhinitis    Deviated nasal septum, hypertrophy of nasal turbinates: Dr. Wilburn Cornelia to do inferior nasal turb reduction and septoplasty as of 03/28/15.  . Colitis   . Diverticulosis 2009   Colonoscopy confirmed approx 2009 (diverticulitis x 2 episodes).  . Elevated PSA 08/2016   Urol eval: plan is to repeat PSA's to follow trend: PSA 01/08/17: 3.52. (no bx as of 01/2017).  I will repeat PSA at his CPE 07/2017.  He returns for another PSA w/Dr.  Louis Meckel 12/2017.  [name] GERD (gastroesophageal reflux disease)   . History of adenomatous polyp of colon 12/11/2015   Recall 5 yrs  . Irritable bowel syndrome with diarrhea   . OSA (obstructive sleep apnea)    Sleep study in remote past was normal per pt report.  OSA eval w/[facility] 2018.  AHI 19/hr, desat to 81%.  Titration study 01/21/2017, optimal CPAP 6 cm H20.   . Rotator cuff impingement syndrome 2018   Bilat (Dr. Tamala Julian).  Ongoing R shoulder pain: labral tear treated non-operatively=failed, so referred to ortho 11/2016.  [name] Sleep concern    study done from a bout 2007- in [place]   . Thrombocytopenia ([place]) 2014   Mild; stable 2014-2016.  Abd u/s 02/2014 NORMAL.     Past Surgical History:  Procedure Laterality Date  . COLONOSCOPY W/ POLYPECTOMY  12/11/2015   Tubular adenoma x 1, +Diverticulosis and int hem.  Recall 5 yrs.  [name] EYE SURGERY     lasik  . KNEE ARTHROSCOPY Bilateral    Bilat; 2003, 2004, 2010  . NASAL SEPTOPLASTY W/ TURBINOPLASTY  04/14/15  . NASAL SEPTOPLASTY W/ TURBINOPLASTY Bilateral 04/14/2015   Procedure: NASAL SEPTOPLASTY WITH BILATERAL INFERIOR  TURBINATE REDUCTION;  Surgeon: Jerrell Belfast, MD;  Location: [facility];  Service: ENT;  Laterality: Bilateral;  . SHOULDER ARTHROSCOPY Bilateral    Bilat; 1993, 2000, 2007  . WISDOM TOOTH EXTRACTION     young adult  . WRIST FRACTURE SURGERY Left  2010   Hockey injury, plate inserted    Allergies  Allergen Reactions  . Dayquil Multi-Symptom Cold-Flu [Pseudoephedrine-Apap-Dm] Hives     Social History   Socioeconomic History  . Marital status: Married    Spouse name: Not on file  . Number of children: 4  . Years of education: Not on file  . Highest education level: Not on file  Social Needs  . Financial resource strain: Not on file  . Food insecurity - worry: Not on file  . Food insecurity - inability: Not on file  . Transportation needs - medical: Not on file  . Transportation needs - non-medical: Not on file    Occupational History  . Occupation: Chief Financial Officer  Tobacco Use  . Smoking status: Never Smoker  . Smokeless tobacco: Never Used  Substance and Sexual Activity  . Alcohol use: Yes    Alcohol/week: 0.0 oz    Comment: beer occasional  . Drug use: No  . Sexual activity: Not on file  Other Topics Concern  . Not on file  Social History Narrative   Married, 4 children from ages 9 yrs to 52 yrs.  No sibs.   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   Grad from [employer] in [place], [place].   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   Exercises irregularly, no particular dietary focus.   No T/A/Ds.     Family History  Problem Relation Age of Onset  . Pancreatic cancer Paternal Grandfather   . Lung cancer Paternal Grandfather         (smoker)  . Colon cancer Maternal Grandmother   . Colon polyps Father   . Diverticulitis Mother      Review of Systems  HENT: Positive for congestion, sinus pressure and sneezing.   Respiratory: Positive for shortness of breath.   Allergic/Immunologic: Positive for environmental allergies.  Neurological: Positive for headaches.       Objective:   Physical Exam  Gen. Pleasant, obese, in no distress, normal affect ENT - no lesions, no post nasal drip, class 2 airway Neck: No JVD, no thyromegaly, no carotid bruits Lungs: no use of accessory muscles, no dullness to percussion, decreased without rales or rhonchi  Cardiovascular: Rhythm regular, heart sounds  normal, no murmurs or gallops, no peripheral edema Abdomen: soft and non-tender, no hepatosplenomegaly, BS normal. Musculoskeletal: No deformities, no cyanosis or clubbing Neuro:  alert, non focal, no tremors         Assessment & Plan:

## 2017-03-26 DIAGNOSIS — M5032 Other cervical disc degeneration, mid-cervical region, unspecified level: Secondary | ICD-10-CM | POA: Diagnosis not present

## 2017-03-26 DIAGNOSIS — M531 Cervicobrachial syndrome: Secondary | ICD-10-CM | POA: Diagnosis not present

## 2017-03-26 DIAGNOSIS — M9901 Segmental and somatic dysfunction of cervical region: Secondary | ICD-10-CM | POA: Diagnosis not present

## 2017-04-04 DIAGNOSIS — G4733 Obstructive sleep apnea (adult) (pediatric): Secondary | ICD-10-CM | POA: Diagnosis not present

## 2017-04-14 DIAGNOSIS — M67911 Unspecified disorder of synovium and tendon, right shoulder: Secondary | ICD-10-CM | POA: Diagnosis not present

## 2017-04-22 DIAGNOSIS — G4733 Obstructive sleep apnea (adult) (pediatric): Secondary | ICD-10-CM | POA: Diagnosis not present

## 2017-05-19 DIAGNOSIS — H401231 Low-tension glaucoma, bilateral, mild stage: Secondary | ICD-10-CM | POA: Diagnosis not present

## 2017-05-20 DIAGNOSIS — G4733 Obstructive sleep apnea (adult) (pediatric): Secondary | ICD-10-CM | POA: Diagnosis not present

## 2017-05-21 DIAGNOSIS — M531 Cervicobrachial syndrome: Secondary | ICD-10-CM | POA: Diagnosis not present

## 2017-05-21 DIAGNOSIS — M9901 Segmental and somatic dysfunction of cervical region: Secondary | ICD-10-CM | POA: Diagnosis not present

## 2017-05-21 DIAGNOSIS — M5032 Other cervical disc degeneration, mid-cervical region, unspecified level: Secondary | ICD-10-CM | POA: Diagnosis not present

## 2017-05-27 ENCOUNTER — Telehealth: Payer: Self-pay | Admitting: Pulmonary Disease

## 2017-05-27 NOTE — Telephone Encounter (Signed)
lmtcb x1 for Ch[place] with [employer].

## 2017-05-28 DIAGNOSIS — Z09 Encounter for follow-up examination after completed treatment for conditions other than malignant neoplasm: Secondary | ICD-10-CM | POA: Diagnosis not present

## 2017-05-28 NOTE — Telephone Encounter (Signed)
Left message for Roy [name] to call back. Need a fax number to send the OV notes to.

## 2017-05-29 NOTE — Telephone Encounter (Signed)
Spoke to [place], who is requesting OV notes from 03/25/17 to be faxed to [phone]. OV note has been faxed to provided number.  Nothing further is needed.

## 2017-05-29 NOTE — Telephone Encounter (Addendum)
lmtcb x2 for Chaz with [employer]

## 2017-06-10 ENCOUNTER — Telehealth: Payer: Self-pay | Admitting: Pulmonary Disease

## 2017-06-10 NOTE — Telephone Encounter (Signed)
OV notes have been faxed to [place]. Nothing further was needed at this time.

## 2017-06-20 DIAGNOSIS — G4733 Obstructive sleep apnea (adult) (pediatric): Secondary | ICD-10-CM | POA: Diagnosis not present

## 2017-07-06 ENCOUNTER — Other Ambulatory Visit: Payer: Self-pay | Admitting: Family Medicine

## 2017-07-07 DIAGNOSIS — H401231 Low-tension glaucoma, bilateral, mild stage: Secondary | ICD-10-CM | POA: Diagnosis not present

## 2017-07-09 ENCOUNTER — Telehealth: Payer: Self-pay | Admitting: Pulmonary Disease

## 2017-07-09 NOTE — Telephone Encounter (Signed)
OV notes have been faxed. Nothing further was needed. 

## 2017-07-17 DIAGNOSIS — M9901 Segmental and somatic dysfunction of cervical region: Secondary | ICD-10-CM | POA: Diagnosis not present

## 2017-07-17 DIAGNOSIS — M531 Cervicobrachial syndrome: Secondary | ICD-10-CM | POA: Diagnosis not present

## 2017-07-17 DIAGNOSIS — M5032 Other cervical disc degeneration, mid-cervical region, unspecified level: Secondary | ICD-10-CM | POA: Diagnosis not present

## 2017-07-20 DIAGNOSIS — G4733 Obstructive sleep apnea (adult) (pediatric): Secondary | ICD-10-CM | POA: Diagnosis not present

## 2017-07-24 ENCOUNTER — Telehealth: Payer: Self-pay | Admitting: Pulmonary Disease

## 2017-07-24 NOTE — Telephone Encounter (Signed)
1.29.19 office note printed  LMOM for [place] with [employer] to verify the fax number >> asked him/her to please call the office back if the number is not correct and/or to please call the office once the office note is received so that we may close out the message  Will await call back from [facility]

## 2017-07-25 NOTE — Telephone Encounter (Signed)
Called and left message for [place] to see if he received fax yesterday on office notes.

## 2017-07-28 NOTE — Telephone Encounter (Signed)
Left message on Voicemail for Roy [name] at [facility] to call back to let us know if office notes were received.

## 2017-07-29 NOTE — Telephone Encounter (Addendum)
Spoke with [employer] and they stated they can pull notes from Epic and send to the appropriate department. They will call back if anything else is needed. Will close this encounter.

## 2017-07-31 ENCOUNTER — Telehealth: Payer: Self-pay | Admitting: Family Medicine

## 2017-08-04 NOTE — Telephone Encounter (Signed)
Pt advised and voiced understanding.    Apt made for 08/20/17 at 9:45am for CPE.  Pt has to be put on cancellation/wait list for earlier apt.

## 2017-08-07 DIAGNOSIS — G4733 Obstructive sleep apnea (adult) (pediatric): Secondary | ICD-10-CM | POA: Diagnosis not present

## 2017-08-08 ENCOUNTER — Encounter: Payer: Self-pay | Admitting: Family Medicine

## 2017-08-08 ENCOUNTER — Ambulatory Visit (INDEPENDENT_AMBULATORY_CARE_PROVIDER_SITE_OTHER): Payer: 59 | Admitting: Family Medicine

## 2017-08-08 VITALS — BP 121/78 | HR 66 | Temp 98.4°F | Resp 16 | Ht 72.5 in | Wt 258.2 lb

## 2017-08-08 DIAGNOSIS — Z0000 Encounter for general adult medical examination without abnormal findings: Secondary | ICD-10-CM | POA: Diagnosis not present

## 2017-08-08 DIAGNOSIS — E669 Obesity, unspecified: Secondary | ICD-10-CM

## 2017-08-08 DIAGNOSIS — R972 Elevated prostate specific antigen [PSA]: Secondary | ICD-10-CM

## 2017-08-08 LAB — COMPREHENSIVE METABOLIC PANEL
ALBUMIN: 4.3 g/dL (ref 3.5–5.2)
ALK PHOS: 100 U/L (ref 39–117)
ALT: 39 U/L (ref 0–53)
AST: 23 U/L (ref 0–37)
BUN: 16 mg/dL (ref 6–23)
CO2: 29 mEq/L (ref 19–32)
CREATININE: 1.11 mg/dL (ref 0.40–1.50)
Calcium: 9.4 mg/dL (ref 8.4–10.5)
Chloride: 104 mEq/L (ref 96–112)
GFR: 75.6 mL/min (ref >60.00)
Glucose, Bld: 102 mg/dL — ABNORMAL HIGH (ref 70–99)
POTASSIUM: 4.4 meq/L (ref 3.5–5.1)
SODIUM: 141 meq/L (ref 135–145)
TOTAL PROTEIN: 7.4 g/dL (ref 6.0–8.3)
Total Bilirubin: 0.6 mg/dL (ref 0.2–1.2)

## 2017-08-08 LAB — LDL CHOLESTEROL, DIRECT: Direct LDL: 146 mg/dL

## 2017-08-08 LAB — CBC WITH DIFFERENTIAL/PLATELET
BASOS ABS: 0 10*3/uL (ref 0.0–0.1)
Basophils Relative: 0.9 % (ref 0.0–3.0)
EOS PCT: 3.9 % (ref 0.0–5.0)
Eosinophils Absolute: 0.2 10*3/uL (ref 0.0–0.7)
HCT: 45.9 % (ref 39.0–52.0)
HEMOGLOBIN: 15.9 g/dL (ref 13.0–17.0)
Lymphocytes Relative: 26.3 % (ref 12.0–46.0)
Lymphs Abs: 1.2 10*3/uL (ref 0.7–4.0)
MCHC: 34.7 g/dL (ref 30.0–36.0)
MCV: 93.2 fl (ref 78.0–100.0)
MONO ABS: 0.5 10*3/uL (ref 0.1–1.0)
MONOS PCT: 12 % (ref 3.0–12.0)
Neutro Abs: 2.5 10*3/uL (ref 1.4–7.7)
Neutrophils Relative %: 56.9 % (ref 43.0–77.0)
Platelets: 139 10*3/uL — ABNORMAL LOW (ref 150.0–400.0)
RBC: 4.92 Mil/uL (ref 4.22–5.81)
RDW: 13.6 % (ref 11.5–15.5)
WBC: 4.4 10*3/uL (ref 4.0–10.5)

## 2017-08-08 LAB — LIPID PANEL
CHOL/HDL RATIO: 7
Cholesterol: 252 mg/dL — ABNORMAL HIGH (ref 0–200)
HDL: 34.4 mg/dL — ABNORMAL LOW (ref >39.00)
NONHDL: 217.84
TRIGLYCERIDES: 210 mg/dL — AB (ref 0.0–149.0)
VLDL: 42 mg/dL — ABNORMAL HIGH (ref 0.0–40.0)

## 2017-08-08 LAB — TSH: TSH: 2.26 u[IU]/mL (ref 0.35–4.50)

## 2017-08-08 NOTE — Progress Notes (Signed)
Office Note 08/08/2017  CC:  Chief Complaint  Patient presents with  . Annual Exam    Pt is fasting.     HPI:  Roy [name] is a 47 y.o. White male who is here for annual health maintenance exam.  Exercise: starting to get back into lifting wt's (shoulder surgery 12/2016). Diet: working on limiting portion size and simple sugars. Dental; preventatives UTD.  Has invisaligne. Eye: exam UTD.  Past Medical History:  Diagnosis Date  . Chronic rhinitis    Deviated nasal septum, hypertrophy of nasal turbinates: Dr. Dustin Flock turb reduct and septoplasty.  . Colitis   . Diverticulosis 2009   Colonoscopy confirmed approx 2009 (diverticulitis x 2 episodes).  . Elevated PSA 08/2016   Urol eval: plan is to repeat PSA's to follow trend: PSA 01/08/17: 3.52. (no bx as of 01/2017).  I will repeat PSA at his CPE 07/2017.  He returns for another PSA w/Dr. Louis Meckel 12/2017.  [name] GERD (gastroesophageal reflux disease)   . History of adenomatous polyp of colon 12/11/2015   Recall 5 yrs  . Irritable bowel syndrome with diarrhea   . OSA on CPAP    Sleep study in remote past was normal per pt report.  OSA eval w/[facility] 2018.  AHI 19/hr, desat to 81%.  Titration study 01/21/2017, optimal CPAP 6 cm H20.   . Rotator cuff impingement syndrome 2018   Bilat (Dr. Tamala Julian).  Ongoing R shoulder pain: labral tear treated non-operatively=failed, so referred to ortho 11/2016.  [name] Thrombocytopenia ([place]) 2014   Mild; stable 2014-2016.  Abd u/s 02/2014 NORMAL.    Past Surgical History:  Procedure Laterality Date  . COLONOSCOPY W/ POLYPECTOMY  12/11/2015   Tubular adenoma x 1, +Diverticulosis and int hem.  Recall 5 yrs.  [name] EYE SURGERY     lasik  . KNEE ARTHROSCOPY Bilateral    Bilat; 2003, 2004, 2010  . NASAL SEPTOPLASTY W/ TURBINOPLASTY Bilateral 04/14/2015   Procedure: NASAL SEPTOPLASTY WITH BILATERAL INFERIOR  TURBINATE REDUCTION;  Surgeon: Jerrell Belfast, MD;  Location: [facility];  Service: ENT;   Laterality: Bilateral;  . SHOULDER ARTHROSCOPY Bilateral    Bilat; 1993, 2000, 2007  . WISDOM TOOTH EXTRACTION     young adult  . WRIST FRACTURE SURGERY Left 2010   Hockey injury, plate inserted    Family History  Problem Relation Age of Onset  . Pancreatic cancer Paternal Grandfather   . Lung cancer Paternal Grandfather         (smoker)  . Colon cancer Maternal Grandmother   . Colon polyps Father   . Diverticulitis Mother     Social History   Socioeconomic History  . Marital status: Married    Spouse name: Not on file  . Number of children: 4  . Years of education: Not on file  . Highest education level: Not on file  Occupational History  . Occupation: Chief Financial Officer  Social Needs  . Financial resource strain: Not on file  . Food insecurity:    Worry: Not on file    Inability: Not on file  . Transportation needs:    Medical: Not on file    Non-medical: Not on file  Tobacco Use  . Smoking status: Never Smoker  . Smokeless tobacco: Never Used  Substance and Sexual Activity  . Alcohol use: Yes    Alcohol/week: 0.0 oz    Comment: beer occasional  . Drug use: No  . Sexual activity: Not on file  Lifestyle  . Physical activity:  Days per week: Not on file    Minutes per session: Not on file  . Stress: Not on file  Relationships  . Social connections:    Talks on phone: Not on file    Gets together: Not on file    Attends religious service: Not on file    Active member of club or organization: Not on file    Attends meetings of clubs or organizations: Not on file    Relationship status: Not on file  . Intimate partner violence:    Fear of current or ex partner: Not on file    Emotionally abused: Not on file    Physically abused: Not on file    Forced sexual activity: Not on file  Other Topics Concern  . Not on file  Social History Narrative   Married, 4 children from ages 23 yrs to 61 yrs.  No sibs.   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   Grad from [employer] in [place], [place].   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   Exercises irregularly, no particular dietary focus.   No T/A/Ds.    Outpatient Medications Prior to Visit  Medication Sig Dispense Refill  . BETIMOL 0.25 % ophthalmic solution Place 1 drop into both eyes 2 (two) times daily.  6  . latanoprost (XALATAN) 0.005 % ophthalmic solution Place 1 drop into both eyes at bedtime.    [name] loratadine (ALLERGY) 10 MG tablet Take 10 mg by mouth daily.    [name] omeprazole (PRILOSEC) 40 MG capsule TAKE 1 CAPSULE DAILY 30 capsule 0  . Azelastine-Fluticasone (DYMISTA) 137-50 MCG/ACT SUSP Place 1 puff into the nose 2 (two) times daily. (Patient not taking: Reported on 03/25/2017) 23 g 11  . Vitamin D, Ergocalciferol, (DRISDOL) 50000 units CAPS capsule TAKE ONE CAPSULE BY MOUTH EVERY 7 DAYS (Patient not taking: Reported on 03/25/2017) 12 capsule 0   No facility-administered medications prior to visit.     Allergies  Allergen Reactions  . Dayquil Multi-Symptom Cold-Flu [Pseudoephedrine-Apap-Dm] Hives    ROS Review of Systems  Constitutional: Negative for appetite change, chills, fatigue and fever.  HENT: Negative for congestion, dental problem, ear pain and sore throat.   Eyes: Negative for discharge, redness and visual disturbance.  Respiratory: Negative for cough, chest tightness, shortness of breath and wheezing.   Cardiovascular: Negative for chest pain, palpitations and leg swelling.  Gastrointestinal: Negative for abdominal pain, blood in stool, diarrhea, nausea and vomiting.  Genitourinary: Negative for difficulty urinating, dysuria, flank pain, frequency, hematuria and urgency.  Musculoskeletal: Negative for arthralgias, back pain, joint swelling, myalgias and neck stiffness.  Skin: Negative for pallor and rash.  Neurological: Negative for dizziness, speech difficulty, weakness and headaches.   Hematological: Negative for adenopathy. Does not bruise/bleed easily.  Psychiatric/Behavioral: Negative for confusion and sleep disturbance. The patient is not nervous/anxious.     PE; Blood pressure 121/78, pulse 66, temperature 98.4 F (36.9 C), temperature source Oral, resp. rate 16, height 6' 0.5" (1.842 m), weight 258 lb 4 oz (117.1 kg), SpO2 97 %. Body mass index is 34.54 kg/m.  Gen: Alert, well appearing.  Patient is oriented to person, place, time, and situation. AFFECT: pleasant, lucid thought and speech. ENT: Ears: EACs clear, normal epithelium.  TMs with good light reflex and landmarks bilaterally.  Eyes: no injection, icteris, swelling, or exudate.  EOMI, PERRLA. Nose: no drainage or turbinate edema/swelling.  No injection or focal lesion.  Mouth: lips without lesion/swelling.  Oral mucosa pink and moist.  Dentition intact and without obvious caries or gingival swelling.  Oropharynx without erythema, exudate, or swelling.  Neck: supple/nontender.  No LAD, mass, or TM.  Carotid pulses 2+ bilaterally, without bruits. CV: RRR, no m/r/g.   LUNGS: CTA bilat, nonlabored resps, good aeration in all lung fields. ABD: soft, NT, ND, BS normal.  No hepatospenomegaly or mass.  No bruits. EXT: no clubbing, cyanosis, or edema.  Musculoskeletal: no joint swelling, erythema, warmth, or tenderness.  ROM of all joints intact. Skin - no sores or suspicious lesions or rashes or color changes   Pertinent labs:  Lab Results  Component Value Date   TSH 2.24 07/31/2016   Lab Results  Component Value Date   WBC 4.3 07/31/2016   HGB 15.8 07/31/2016   HCT 46.2 07/31/2016   MCV 94.3 07/31/2016   PLT 134.0 (L) 07/31/2016   Lab Results  Component Value Date   CREATININE 1.10 07/31/2016   BUN 13 07/31/2016   NA 139 07/31/2016   K 4.5 07/31/2016   CL 104 07/31/2016   CO2 30 07/31/2016   Lab Results  Component Value Date   ALT 15 07/31/2016   AST 16 07/31/2016   ALKPHOS 88 07/31/2016    BILITOT 0.7 07/31/2016   Lab Results  Component Value Date   CHOL 232 (H) 07/31/2016   Lab Results  Component Value Date   HDL 33.10 (L) 07/31/2016   Lab Results  Component Value Date   LDLCALC 203 (H) 08/04/2015   Lab Results  Component Value Date   TRIG 261.0 (H) 07/31/2016   Lab Results  Component Value Date   CHOLHDL 7 07/31/2016   Lab Results  Component Value Date   PSA 4.29 (H) 07/31/2016   PSA 3.53 08/04/2015   Lab Results  Component Value Date   HGBA1C 5.8 07/31/2016   ASSESSMENT AND PLAN:   Health maintenance exam: Reviewed age and gender appropriate health maintenance issues (prudent diet, regular exercise, health risks of tobacco and excessive alcohol, use of seatbelts, fire alarms in home, use of sunscreen).  Also reviewed age and gender appropriate health screening as well as vaccine recommendations. Vaccines: UTD. Labs: fasting HP. Prostate ca screening: hx of elevated PSA (Dr. Louis Meckel, urol is following)--I'll leave this to Dr. Louis Meckel to monitor. Colon ca screening: hx of adenomatous colon polyp--> repeat colonoscopy 11/2020.  An After Visit Summary was printed and given to the patient.  FOLLOW UP:  Return in about 1 year (around 08/09/2018) for annual CPE (fasting).  Signed:  Crissie Sickles, MD           08/08/2017

## 2017-08-08 NOTE — Patient Instructions (Signed)

## 2017-08-12 ENCOUNTER — Encounter: Payer: Self-pay | Admitting: Family Medicine

## 2017-08-20 ENCOUNTER — Encounter: Payer: 59 | Admitting: Family Medicine

## 2017-08-20 DIAGNOSIS — G4733 Obstructive sleep apnea (adult) (pediatric): Secondary | ICD-10-CM | POA: Diagnosis not present

## 2017-08-24 ENCOUNTER — Other Ambulatory Visit: Payer: Self-pay | Admitting: Family Medicine

## 2017-09-19 DIAGNOSIS — G4733 Obstructive sleep apnea (adult) (pediatric): Secondary | ICD-10-CM | POA: Diagnosis not present

## 2017-09-22 ENCOUNTER — Other Ambulatory Visit: Payer: Self-pay | Admitting: Family Medicine

## 2017-10-20 DIAGNOSIS — G4733 Obstructive sleep apnea (adult) (pediatric): Secondary | ICD-10-CM | POA: Diagnosis not present

## 2017-11-05 DIAGNOSIS — G4733 Obstructive sleep apnea (adult) (pediatric): Secondary | ICD-10-CM | POA: Diagnosis not present

## 2017-11-10 DIAGNOSIS — H401231 Low-tension glaucoma, bilateral, mild stage: Secondary | ICD-10-CM | POA: Diagnosis not present

## 2017-11-20 DIAGNOSIS — G4733 Obstructive sleep apnea (adult) (pediatric): Secondary | ICD-10-CM | POA: Diagnosis not present

## 2017-12-20 DIAGNOSIS — G4733 Obstructive sleep apnea (adult) (pediatric): Secondary | ICD-10-CM | POA: Diagnosis not present

## 2018-01-20 DIAGNOSIS — G4733 Obstructive sleep apnea (adult) (pediatric): Secondary | ICD-10-CM | POA: Diagnosis not present

## 2018-01-25 HISTORY — PX: LUMBAR DISC SURGERY: SHX700

## 2018-01-26 DIAGNOSIS — R972 Elevated prostate specific antigen [PSA]: Secondary | ICD-10-CM | POA: Diagnosis not present

## 2018-01-30 DIAGNOSIS — R972 Elevated prostate specific antigen [PSA]: Secondary | ICD-10-CM | POA: Diagnosis not present

## 2018-02-02 DIAGNOSIS — Z79899 Other long term (current) drug therapy: Secondary | ICD-10-CM | POA: Diagnosis not present

## 2018-02-02 DIAGNOSIS — Z888 Allergy status to other drugs, medicaments and biological substances status: Secondary | ICD-10-CM | POA: Diagnosis not present

## 2018-02-02 DIAGNOSIS — M4317 Spondylolisthesis, lumbosacral region: Secondary | ICD-10-CM | POA: Diagnosis not present

## 2018-02-02 DIAGNOSIS — M5126 Other intervertebral disc displacement, lumbar region: Secondary | ICD-10-CM | POA: Diagnosis not present

## 2018-02-02 DIAGNOSIS — M5416 Radiculopathy, lumbar region: Secondary | ICD-10-CM | POA: Diagnosis not present

## 2018-02-02 DIAGNOSIS — S32059A Unspecified fracture of fifth lumbar vertebra, initial encounter for closed fracture: Secondary | ICD-10-CM | POA: Diagnosis not present

## 2018-02-02 DIAGNOSIS — M545 Low back pain: Secondary | ICD-10-CM | POA: Diagnosis not present

## 2018-02-03 DIAGNOSIS — G4733 Obstructive sleep apnea (adult) (pediatric): Secondary | ICD-10-CM | POA: Diagnosis not present

## 2018-02-04 ENCOUNTER — Telehealth: Payer: Self-pay

## 2018-02-04 ENCOUNTER — Encounter

## 2018-02-04 ENCOUNTER — Ambulatory Visit: Payer: 59 | Admitting: Family Medicine

## 2018-02-04 DIAGNOSIS — M545 Low back pain: Secondary | ICD-10-CM | POA: Diagnosis not present

## 2018-02-04 DIAGNOSIS — M5416 Radiculopathy, lumbar region: Secondary | ICD-10-CM | POA: Diagnosis not present

## 2018-02-04 DIAGNOSIS — Z888 Allergy status to other drugs, medicaments and biological substances status: Secondary | ICD-10-CM | POA: Diagnosis not present

## 2018-02-04 DIAGNOSIS — M5127 Other intervertebral disc displacement, lumbosacral region: Secondary | ICD-10-CM | POA: Diagnosis not present

## 2018-02-04 DIAGNOSIS — M4856XA Collapsed vertebra, not elsewhere classified, lumbar region, initial encounter for fracture: Secondary | ICD-10-CM | POA: Diagnosis not present

## 2018-02-04 DIAGNOSIS — M5126 Other intervertebral disc displacement, lumbar region: Secondary | ICD-10-CM | POA: Diagnosis not present

## 2018-02-04 NOTE — Telephone Encounter (Signed)
Copied from [place] [id number]. Topic: General - Other >> Feb 03, 2018  2:44 PM Cecelia Byars, NT wrote: Reason for CRM: Patient states he went to [facility] ed last night for sever back pain and would like to have records from this visit requested

## 2018-02-04 NOTE — Telephone Encounter (Signed)
Returned call to patient and he stated that he had to go back to ED and get MRI, he will request they sent copy of records and results to his PCP.

## 2018-02-05 DIAGNOSIS — M4317 Spondylolisthesis, lumbosacral region: Secondary | ICD-10-CM | POA: Diagnosis not present

## 2018-02-05 DIAGNOSIS — M5126 Other intervertebral disc displacement, lumbar region: Secondary | ICD-10-CM | POA: Diagnosis not present

## 2018-02-05 DIAGNOSIS — M4307 Spondylolysis, lumbosacral region: Secondary | ICD-10-CM | POA: Diagnosis not present

## 2018-02-06 DIAGNOSIS — M5116 Intervertebral disc disorders with radiculopathy, lumbar region: Secondary | ICD-10-CM | POA: Insufficient documentation

## 2018-02-06 DIAGNOSIS — M2578 Osteophyte, vertebrae: Secondary | ICD-10-CM | POA: Diagnosis not present

## 2018-02-06 DIAGNOSIS — M5127 Other intervertebral disc displacement, lumbosacral region: Secondary | ICD-10-CM | POA: Diagnosis not present

## 2018-02-06 DIAGNOSIS — M47896 Other spondylosis, lumbar region: Secondary | ICD-10-CM | POA: Diagnosis not present

## 2018-02-10 ENCOUNTER — Encounter: Payer: Self-pay | Admitting: Family Medicine

## 2018-05-01 DIAGNOSIS — H401231 Low-tension glaucoma, bilateral, mild stage: Secondary | ICD-10-CM | POA: Diagnosis not present

## 2018-05-05 DIAGNOSIS — G4733 Obstructive sleep apnea (adult) (pediatric): Secondary | ICD-10-CM | POA: Diagnosis not present

## 2018-05-17 ENCOUNTER — Other Ambulatory Visit: Payer: Self-pay | Admitting: Family Medicine

## 2018-08-10 NOTE — Progress Notes (Signed)
Office Note 08/11/2018  CC:  Chief Complaint  Patient presents with  . Annual Exam    pt is fasting    HPI:  Roy [name] is a 48 y.o. White male who is here for annual health maintenance exam.  Feeling well, no complaints. Not much exercise or focus on healthy diet these days.  The only meds he takes on a regular basis are PPI and allergy meds.  Past Medical History:  Diagnosis Date  . Chronic rhinitis    Deviated nasal septum, hypertrophy of nasal turbinates: Dr. Dustin Flock turb reduct and septoplasty.  . Colitis   . Diverticulosis 2009   Colonoscopy confirmed approx 2009 (diverticulitis x 2 episodes).  . Elevated PSA 08/2016   Urol eval: plan is to repeat PSA's to follow trend: PSA 01/08/17: 3.52. (no bx as of 01/2017).  I will repeat PSA at his CPE 07/2017.  He returns for another PSA w/Dr. Herrick 12/2017--this was 2.49.  Needs annual PSAs with either urologist or me.  [name] GERD (gastroesophageal reflux disease)   . Glaucoma   . History of adenomatous polyp of colon 12/11/2015   Recall 5 yrs  . Hypercholesterolemia 2018 and 2019   Mild; Framingham CV risk<<7.5%.  TLC.  . Irritable bowel syndrome with diarrhea   . OSA on CPAP    Sleep study in remote past was normal per pt report.  OSA eval w/[place] 2018.  AHI 19/hr, desat to 81%.  Titration study 01/21/2017, optimal CPAP 6 cm H20.   . Rotator cuff impingement syndrome 2018   Bilat (Dr. Tamala Julian).  Ongoing R shoulder pain: labral tear treated non-operatively=failed, so referred to ortho 11/2016.  [name] Thrombocytopenia ([place]) 2014   Mild; stable 2014-2016.  Abd u/s 02/2014 NORMAL.    Past Surgical History:  Procedure Laterality Date  . COLONOSCOPY W/ POLYPECTOMY  12/11/2015   Tubular adenoma x 1, +Diverticulosis and int hem.  Recall 5 yrs.  [name] EYE SURGERY     lasik  . KNEE ARTHROSCOPY Bilateral    Bilat; 2003, 2004, 2010  . [place] SURGERY  01/2018  . NASAL SEPTOPLASTY W/ TURBINOPLASTY Bilateral  04/14/2015   Procedure: NASAL SEPTOPLASTY WITH BILATERAL INFERIOR  TURBINATE REDUCTION;  Surgeon: Jerrell Belfast, MD;  Location: [facility];  Service: ENT;  Laterality: Bilateral;  . SHOULDER ARTHROSCOPY Bilateral    Bilat; 1993, 2000, 2007  . WISDOM TOOTH EXTRACTION     young adult  . WRIST FRACTURE SURGERY Left 2010   Hockey injury, plate inserted    Family History  Problem Relation Age of Onset  . Pancreatic cancer Paternal Grandfather   . Lung cancer Paternal Grandfather         (smoker)  . Colon cancer Maternal Grandmother   . Colon polyps Father   . Diverticulitis Mother     Social History   Socioeconomic History  . Marital status: Married    Spouse name: Not on file  . Number of children: 4  . Years of education: Not on file  . Highest education level: Not on file  Occupational History  . Occupation: Chief Financial Officer  Social Needs  . Financial resource strain: Not on file  . Food insecurity    Worry: Not on file    Inability: Not on file  . Transportation needs    Medical: Not on file    Non-medical: Not on file  Tobacco Use  . Smoking status: Never Smoker  . Smokeless tobacco: Never Used  Substance and Sexual Activity  .  Alcohol use: Yes    Alcohol/week: 0.0 standard drinks    Comment: beer occasional  . Drug use: No  . Sexual activity: Not on file  Lifestyle  . Physical activity    Days per week: Not on file    Minutes per session: Not on file  . Stress: Not on file  Relationships  . Social Herbalist on phone: Not on file    Gets together: Not on file    Attends religious service: Not on file    Active member of club or organization: Not on file    Attends meetings of clubs or organizations: Not on file    Relationship status: Not on file  . Intimate partner violence    Fear of current or ex partner: Not on file    Emotionally abused: Not on file    Physically abused: Not on file    Forced sexual activity: Not on file  Other Topics Concern  .  Not on file  Social History Narrative   Married, 4 children from ages 57 yrs to 70 yrs.  No sibs.   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   Grad from [employer] in [place], [place].   Originally from [place], lived in [place] for a while, then relocated to [facility] Retail [place]) about 2010.   Exercises irregularly, no particular dietary focus.   No T/A/Ds.    Outpatient Medications Prior to Visit  Medication Sig Dispense Refill  . BETIMOL 0.25 % ophthalmic solution Place 1 drop into both eyes 2 (two) times daily.  6  . latanoprost (XALATAN) 0.005 % ophthalmic solution Place 1 drop into both eyes at bedtime.    [name] loratadine (ALLERGY) 10 MG tablet Take 10 mg by mouth daily.    [name] omeprazole (PRILOSEC) 40 MG capsule TAKE 1 CAPSULE BY MOUTH EVERY DAY 30 capsule 3   No facility-administered medications prior to visit.     Allergies  Allergen Reactions  . Dayquil Multi-Symptom Cold-Flu [Pseudoephedrine-Apap-Dm] Hives    ROS Review of Systems  Constitutional: Negative for appetite change, chills, fatigue and fever.  HENT: Negative for congestion, dental problem, ear pain and sore throat.   Eyes: Negative for discharge, redness and visual disturbance.  Respiratory: Negative for cough, chest tightness, shortness of breath and wheezing.   Cardiovascular: Negative for chest pain, palpitations and leg swelling.  Gastrointestinal: Negative for abdominal pain, blood in stool, diarrhea, nausea and vomiting.  Genitourinary: Negative for difficulty urinating, dysuria, flank pain, frequency, hematuria and urgency.  Musculoskeletal: Negative for arthralgias, back pain, joint swelling, myalgias and neck stiffness.  Skin: Negative for pallor and rash.  Neurological: Negative for dizziness, speech difficulty, weakness and headaches.  Hematological: Negative for adenopathy. Does not bruise/bleed easily.  Psychiatric/Behavioral: Negative for confusion and sleep disturbance. The patient is  not nervous/anxious.     PE; Blood pressure 117/78, pulse 67, temperature (!) 97.2 F (36.2 C), temperature source Temporal, resp. rate 16, height 6' (1.829 m), weight 260 lb 12.8 oz (118.3 kg), SpO2 97 %. Body mass index is 35.37 kg/m.  Gen: Alert, well appearing.  Patient is oriented to person, place, time, and situation. AFFECT: pleasant, lucid thought and speech. ENT: Ears: EACs clear, normal epithelium.  TMs with good light reflex and landmarks bilaterally.  Eyes: no injection, icteris, swelling, or exudate.  EOMI, PERRLA. Nose: no drainage or turbinate edema/swelling.  No injection or focal lesion.  Mouth: lips without lesion/swelling.  Oral mucosa pink and moist.  Dentition  intact and without obvious caries or gingival swelling.  Oropharynx without erythema, exudate, or swelling.  Neck: supple/nontender.  No LAD, mass, or TM.  Carotid pulses 2+ bilaterally, without bruits. CV: RRR, no m/r/g.   LUNGS: CTA bilat, nonlabored resps, good aeration in all lung fields. ABD: soft, NT, ND, BS normal.  No hepatospenomegaly or mass.  No bruits. EXT: no clubbing, cyanosis, or edema.  Musculoskeletal: no joint swelling, erythema, warmth, or tenderness.  ROM of all joints intact. Skin - no sores or suspicious lesions or rashes or color changes Rectal: deferred-->this is done at his annual urology f/u  Pertinent labs:  Lab Results  Component Value Date   TSH 2.26 08/08/2017   Lab Results  Component Value Date   WBC 4.4 08/08/2017   HGB 15.9 08/08/2017   HCT 45.9 08/08/2017   MCV 93.2 08/08/2017   PLT 139.0 (L) 08/08/2017   Lab Results  Component Value Date   CREATININE 1.11 08/08/2017   BUN 16 08/08/2017   NA 141 08/08/2017   K 4.4 08/08/2017   CL 104 08/08/2017   CO2 29 08/08/2017   Lab Results  Component Value Date   ALT 39 08/08/2017   AST 23 08/08/2017   ALKPHOS 100 08/08/2017   BILITOT 0.6 08/08/2017   Lab Results  Component Value Date   CHOL 252 (H) 08/08/2017    Lab Results  Component Value Date   HDL 34.40 (L) 08/08/2017   Lab Results  Component Value Date   LDLCALC 203 (H) 08/04/2015   Lab Results  Component Value Date   TRIG 210.0 (H) 08/08/2017   Lab Results  Component Value Date   CHOLHDL 7 08/08/2017   Lab Results  Component Value Date   PSA 4.29 (H) 07/31/2016   PSA 3.53 08/04/2015   Lab Results  Component Value Date   HGBA1C 5.8 07/31/2016    ASSESSMENT AND PLAN:   Health maintenance exam: Reviewed age and gender appropriate health maintenance issues (prudent diet, regular exercise, health risks of tobacco and excessive alcohol, use of seatbelts, fire alarms in home, use of sunscreen).  Also reviewed age and gender appropriate health screening as well as vaccine recommendations. Vaccines: all up to date. Labs: HP labs + PSA. Prostate ca screening: hx of inc PSA velocity; PSA repeats to be done at urologist's office. Colon ca screening: next colonoscopy due 2022. An After Visit Summary was printed and given to the patient.  FOLLOW UP:  Return in about 1 year (around 08/11/2019) for annual CPE (fasting).  Signed:  Crissie Sickles, MD           08/11/2018

## 2018-08-11 ENCOUNTER — Encounter: Payer: Self-pay | Admitting: Family Medicine

## 2018-08-11 ENCOUNTER — Other Ambulatory Visit: Payer: Self-pay

## 2018-08-11 ENCOUNTER — Ambulatory Visit (INDEPENDENT_AMBULATORY_CARE_PROVIDER_SITE_OTHER): Payer: 59 | Admitting: Family Medicine

## 2018-08-11 VITALS — BP 117/78 | HR 67 | Temp 97.2°F | Resp 16 | Ht 72.0 in | Wt 260.8 lb

## 2018-08-11 DIAGNOSIS — D696 Thrombocytopenia, unspecified: Secondary | ICD-10-CM | POA: Diagnosis not present

## 2018-08-11 DIAGNOSIS — Z1211 Encounter for screening for malignant neoplasm of colon: Secondary | ICD-10-CM

## 2018-08-11 DIAGNOSIS — E7800 Pure hypercholesterolemia, unspecified: Secondary | ICD-10-CM

## 2018-08-11 DIAGNOSIS — Z0000 Encounter for general adult medical examination without abnormal findings: Secondary | ICD-10-CM

## 2018-08-11 DIAGNOSIS — R972 Elevated prostate specific antigen [PSA]: Secondary | ICD-10-CM

## 2018-08-11 LAB — CBC WITH DIFFERENTIAL/PLATELET
Basophils Absolute: 0 10*3/uL (ref 0.0–0.1)
Basophils Relative: 0.9 % (ref 0.0–3.0)
Eosinophils Absolute: 0.2 10*3/uL (ref 0.0–0.7)
Eosinophils Relative: 3.6 % (ref 0.0–5.0)
HCT: 45.5 % (ref 39.0–52.0)
Hemoglobin: 15.4 g/dL (ref 13.0–17.0)
Lymphocytes Relative: 24.1 % (ref 12.0–46.0)
Lymphs Abs: 1 10*3/uL (ref 0.7–4.0)
MCHC: 33.9 g/dL (ref 30.0–36.0)
MCV: 94.5 fl (ref 78.0–100.0)
Monocytes Absolute: 0.5 10*3/uL (ref 0.1–1.0)
Monocytes Relative: 10.7 % (ref 3.0–12.0)
Neutro Abs: 2.6 10*3/uL (ref 1.4–7.7)
Neutrophils Relative %: 60.7 % (ref 43.0–77.0)
Platelets: 145 10*3/uL — ABNORMAL LOW (ref 150.0–400.0)
RBC: 4.82 Mil/uL (ref 4.22–5.81)
RDW: 13.4 % (ref 11.5–15.5)
WBC: 4.3 10*3/uL (ref 4.0–10.5)

## 2018-08-11 LAB — COMPREHENSIVE METABOLIC PANEL
ALT: 17 U/L (ref 0–53)
AST: 18 U/L (ref 0–37)
Albumin: 4.2 g/dL (ref 3.5–5.2)
Alkaline Phosphatase: 108 U/L (ref 39–117)
BUN: 19 mg/dL (ref 6–23)
CO2: 28 mEq/L (ref 19–32)
Calcium: 9.1 mg/dL (ref 8.4–10.5)
Chloride: 104 mEq/L (ref 96–112)
Creatinine, Ser: 1.07 mg/dL (ref 0.40–1.50)
GFR: 73.88 mL/min (ref >60.00)
Glucose, Bld: 102 mg/dL — ABNORMAL HIGH (ref 70–99)
Potassium: 4.4 mEq/L (ref 3.5–5.1)
Sodium: 138 mEq/L (ref 135–145)
Total Bilirubin: 0.8 mg/dL (ref 0.2–1.2)
Total Protein: 7 g/dL (ref 6.0–8.3)

## 2018-08-11 LAB — LIPID PANEL
Cholesterol: 230 mg/dL — ABNORMAL HIGH (ref 0–200)
HDL: 32.5 mg/dL — ABNORMAL LOW (ref >39.00)
NonHDL: 197.99
Total CHOL/HDL Ratio: 7
Triglycerides: 268 mg/dL — ABNORMAL HIGH (ref 0.0–149.0)
VLDL: 53.6 mg/dL — ABNORMAL HIGH (ref 0.0–40.0)

## 2018-08-11 LAB — LDL CHOLESTEROL, DIRECT: Direct LDL: 144 mg/dL

## 2018-08-11 LAB — TSH: TSH: 2.19 u[IU]/mL (ref 0.35–4.50)

## 2018-08-11 NOTE — Patient Instructions (Signed)

## 2018-08-11 NOTE — Addendum Note (Signed)
Addended by: Ralph Dowdy on: 08/11/2018 08:31 AM   Modules accepted: Orders

## 2018-08-13 ENCOUNTER — Other Ambulatory Visit (INDEPENDENT_AMBULATORY_CARE_PROVIDER_SITE_OTHER): Payer: 59

## 2018-08-13 DIAGNOSIS — R7301 Impaired fasting glucose: Secondary | ICD-10-CM | POA: Diagnosis not present

## 2018-08-13 DIAGNOSIS — E781 Pure hyperglyceridemia: Secondary | ICD-10-CM

## 2018-08-13 LAB — HEMOGLOBIN A1C: Hgb A1c MFr Bld: 5.9 % (ref 4.6–6.5)

## 2018-08-14 ENCOUNTER — Encounter: Payer: Self-pay | Admitting: Family Medicine

## 2018-09-01 ENCOUNTER — Ambulatory Visit: Payer: Self-pay

## 2018-09-01 ENCOUNTER — Encounter: Payer: Self-pay | Admitting: Family Medicine

## 2018-09-01 ENCOUNTER — Other Ambulatory Visit: Payer: Self-pay

## 2018-09-01 ENCOUNTER — Ambulatory Visit (INDEPENDENT_AMBULATORY_CARE_PROVIDER_SITE_OTHER)
Admission: RE | Admit: 2018-09-01 | Discharge: 2018-09-01 | Disposition: A | Discharge status: Home / Self Care | Payer: 59 | Source: Ambulatory Visit | Attending: Family Medicine | Admitting: Family Medicine

## 2018-09-01 ENCOUNTER — Ambulatory Visit (INDEPENDENT_AMBULATORY_CARE_PROVIDER_SITE_OTHER): Payer: 59 | Admitting: Family Medicine

## 2018-09-01 VITALS — BP 120/90 | HR 72 | Ht 72.0 in | Wt 260.0 lb

## 2018-09-01 DIAGNOSIS — M79601 Pain in right arm: Secondary | ICD-10-CM

## 2018-09-01 DIAGNOSIS — M7551 Bursitis of right shoulder: Secondary | ICD-10-CM | POA: Diagnosis not present

## 2018-09-01 MED ORDER — MELOXICAM 7.5 MG PO TABS: 7.5000 mg | ORAL_TABLET | Freq: Every day | ORAL | 0 refills | Status: DC

## 2018-09-01 NOTE — Assessment & Plan Note (Signed)
Patient does have more of a subacromial bursitis of the shoulder as well as after patient relocation of the bicep tendon likely continue to have some discomfort and pain.  X-rays of the neck to further evaluate for any radicular symptoms or could be contributing.  Meloxicam given.  Icing regimen, topical anti-inflammatories.  Follow-up again in 4 to 6 weeks

## 2018-09-01 NOTE — Patient Instructions (Signed)
Good to see you.  Ice 20 minutes 2 times daily. Usually after activity and before bed. Exercises 3 times a week.  See me again in 4-6 weeks if not perfect Meloxicam daily for 10 days and then as needed Drop weight 50% first week increase 25% every week Arm compression xrays downstairs

## 2018-09-01 NOTE — Progress Notes (Signed)
[name] [place], [place] [address] Phone: [phone] Subjective:   I Kandace Blitz am serving as a Education administrator for Dr. Hulan Saas.    CC: right arm pain   [id number]  Roy [name] is a 48 y.o. male coming in with complaint of right arm pain. Has heard "clicking" in the shoulder. Usually works out and believes he injured it with bad form. States his arm feels warm at times. Remembers he was doing a chest exercise when he first felt his pain.   Onset- Chronic (6/26) Location - Shoulder to elbow  Character- at the elbow the pain is soreness Therapies tried- advil  Severity- 7/10      Past Medical History:  Diagnosis Date  . Chronic rhinitis    Deviated nasal septum, hypertrophy of nasal turbinates: Dr. Dustin Flock turb reduct and septoplasty.  . Colitis   . Diverticulosis 2009   Colonoscopy confirmed approx 2009 (diverticulitis x 2 episodes).  . Elevated PSA 08/2016   Urol eval: plan is to repeat PSA's to follow trend: PSA 01/08/17: 3.52. (no bx as of 01/2017).  I will repeat PSA at his CPE 07/2017.  He returns for another PSA w/Dr. Herrick 12/2017--this was 2.49.  Needs annual PSAs with either urologist or me.  [name] GERD (gastroesophageal reflux disease)   . Glaucoma   . History of adenomatous polyp of colon 12/11/2015   Recall 5 yrs  . Hypercholesterolemia 2018,2019,2020   Mild; Framingham CV risk<<7.5%.  TLC.  . Irritable bowel syndrome with diarrhea   . OSA on CPAP    Sleep study in remote past was normal per pt report.  OSA eval w/[place] 2018.  AHI 19/hr, desat to 81%.  Titration study 01/21/2017, optimal CPAP 6 cm H20.   . Rotator cuff impingement syndrome 2018   Bilat (Dr. Tamala Julian).  Ongoing R shoulder pain: labral tear treated non-operatively=failed, so referred to ortho 11/2016.  [name] Thrombocytopenia ([place]) 2014   Mild; stable 2014-2016.  Abd u/s 02/2014 NORMAL.   Past Surgical History:  Procedure Laterality Date   . COLONOSCOPY W/ POLYPECTOMY  12/11/2015   Tubular adenoma x 1, +Diverticulosis and int hem.  Recall 5 yrs.  [name] EYE SURGERY     lasik  . KNEE ARTHROSCOPY Bilateral    Bilat; 2003, 2004, 2010  . [place] SURGERY  01/2018  . NASAL SEPTOPLASTY W/ TURBINOPLASTY Bilateral 04/14/2015   Procedure: NASAL SEPTOPLASTY WITH BILATERAL INFERIOR  TURBINATE REDUCTION;  Surgeon: Jerrell Belfast, MD;  Location: [facility];  Service: ENT;  Laterality: Bilateral;  . SHOULDER ARTHROSCOPY Bilateral    Bilat; 1993, 2000, 2007  . WISDOM TOOTH EXTRACTION     young adult  . WRIST FRACTURE SURGERY Left 2010   Hockey injury, plate inserted   Social History   Socioeconomic History  . Marital status: Married    Spouse name: Not on file  . Number of children: 4  . Years of education: Not on file  . Highest education level: Not on file  Occupational History  . Occupation: Chief Financial Officer  Social Needs  . Financial resource strain: Not on file  . Food insecurity    Worry: Not on file    Inability: Not on file  . Transportation needs    Medical: Not on file    Non-medical: Not on file  Tobacco Use  . Smoking status: Never Smoker  . Smokeless tobacco: Never Used  Substance and Sexual Activity  . Alcohol use: Yes  Alcohol/week: 0.0 standard drinks    Comment: beer occasional  . Drug use: No  . Sexual activity: Not on file  Lifestyle  . Physical activity    Days per week: Not on file    Minutes per session: Not on file  . Stress: Not on file  Relationships  . Social Herbalist on phone: Not on file    Gets together: Not on file    Attends religious service: Not on file    Active member of club or organization: Not on file    Attends meetings of clubs or organizations: Not on file    Relationship status: Not on file  Other Topics Concern  . Not on file  Social History Narrative   Married, 4 children from ages 78 yrs to 37 yrs.  No sibs.   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   Grad from  [employer] in [place], [place].   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   Exercises irregularly, no particular dietary focus.   No T/A/Ds.   Allergies  Allergen Reactions  . Dayquil Multi-Symptom Cold-Flu [Pseudoephedrine-Apap-Dm] Hives   Family History  Problem Relation Age of Onset  . Pancreatic cancer Paternal Grandfather   . Lung cancer Paternal Grandfather         (smoker)  . Colon cancer Maternal Grandmother   . Colon polyps Father   . Diverticulitis Mother       Current Outpatient Medications (Respiratory):  .  loratadine (ALLERGY) 10 MG tablet, Take 10 mg by mouth daily.  Current Outpatient Medications (Analgesics):  .  meloxicam (MOBIC) 7.5 MG tablet, Take 1 tablet (7.5 mg total) by mouth daily.   Current Outpatient Medications (Other):  [name]  BETIMOL 0.25 % ophthalmic solution, Place 1 drop into both eyes 2 (two) times daily. [name]  latanoprost (XALATAN) 0.005 % ophthalmic solution, Place 1 drop into both eyes at bedtime. [name]  omeprazole (PRILOSEC) 40 MG capsule, TAKE 1 CAPSULE BY MOUTH EVERY DAY    Past medical history, social, surgical and family history all reviewed in electronic medical record.  No pertanent information unless stated regarding to the chief complaint.   Review of Systems:  No headache, visual changes, nausea, vomiting, diarrhea, constipation, dizziness, abdominal pain, skin rash, fevers, chills, night sweats, weight loss, swollen lymph nodes, body aches, joint swelling, chest pain, shortness of breath, mood changes.  Positive muscle aches  Objective  Blood pressure 120/90, pulse 72, height 6' (1.829 m), weight 260 lb (117.9 kg), SpO2 98 %.    General: No apparent distress alert and oriented x3 mood and affect normal, dressed appropriately.  HEENT: Pupils equal, extraocular movements intact  Respiratory: Patient's speak in full sentences and does not appear short of  breath  Cardiovascular: No lower extremity edema, non tender, no erythema  Skin: Warm dry intact with no signs of infection or rash on extremities or on axial skeleton.  Abdomen: Soft nontender  Neuro: Cranial nerves II through XII are intact, neurovascularly intact in all extremities with 2+ DTRs and 2+ pulses.  Lymph: No lymphadenopathy of posterior or anterior cervical chain or axillae bilaterally.  Gait normal with good balance and coordination.  MSK:  tender with full range of motion and good stability and symmetric strength and tone of  elbows, wrist, hip, knee and ankles bilaterally.  Right shoulder exam shows the patient does have positive impingement.  Patient does have surgical changes  noted of the anterior bicep.  Patient does have near full range of motion. Patient has no pain with Speed or Yergason's.  Neck exam shows some mild loss of lordosis.  Near full range of negative Spurling's.  Neurovascular intact distally  Limited musculoskeletal ultrasound was performed and interpreted by  Lyndal Pulley  Limited ultrasound of patient's shoulder shows that patient has very mild subacromial bursitis noted.  Bicep tendon does show the surgical intervention.  Patient does not have any true rotator cuff tearing noted.  No abnormality of the anterior posterior labrum.  Patient does have moderate acromioclavicular arthritis with mild swelling.  Patient: Subacromial bursitis and acromioclavicular arthritis mild =   Impression and Recommendations:     This case required medical decision making of moderate complexity. The above documentation has been reviewed and is accurate and complete Lyndal Pulley, DO       Note: This dictation was prepared with Dragon dictation along with smaller phrase technology. Any transcriptional errors that result from this process are unintentional.

## 2018-09-11 ENCOUNTER — Other Ambulatory Visit: Payer: Self-pay | Admitting: Family Medicine

## 2018-09-15 ENCOUNTER — Encounter: Payer: Self-pay | Admitting: Family Medicine

## 2018-09-28 ENCOUNTER — Other Ambulatory Visit: Payer: Self-pay | Admitting: Family Medicine

## 2018-11-04 ENCOUNTER — Encounter: Payer: Self-pay | Admitting: Internal Medicine

## 2018-12-07 ENCOUNTER — Ambulatory Visit: Payer: 59 | Admitting: *Deleted

## 2018-12-07 ENCOUNTER — Other Ambulatory Visit: Payer: Self-pay

## 2018-12-07 VITALS — Temp 96.4°F | Ht 72.0 in | Wt 248.0 lb

## 2018-12-07 DIAGNOSIS — Z86010 Personal history of colonic polyps: Secondary | ICD-10-CM

## 2018-12-07 MED ORDER — SUPREP BOWEL PREP KIT 17.5-3.13-1.6 GM/177ML PO SOLN: 1.0000 | Freq: Once | ORAL | 0 refills | Status: AC

## 2018-12-07 NOTE — Progress Notes (Signed)
No egg or soy allergy known to patient  No issues with past sedation with any surgeries  or procedures, no intubation problems  No diet pills per patient No home 02 use per patient  No blood thinners per patient  Pt denies issues with constipation  No A fib or A flutter  EMMI given to the patient. suprep coupon given  COVID screening test offered, patient denies wanting this test at this time. Patient informed may get a call for testing.  Due to the COVID-19 pandemic we are asking patients to follow these guidelines. Please only bring one care partner. Please be aware that your care partner may wait in the car in the parking lot or if they feel like they will be too hot to wait in the car, they may wait in the lobby on the 4th floor. All care partners are required to wear a mask the entire time (we do not have any that we can provide them), they need to practice social distancing, and we will do a Covid check for all patient's and care partners when you arrive. Also we will check their temperature and your temperature. If the care partner waits in their car they need to stay in the parking lot the entire time and we will call them on their cell phone when the patient is ready for discharge so they can bring the car to the front of the building. Also all patient's will need to wear a mask into building.

## 2018-12-10 ENCOUNTER — Encounter: Payer: Self-pay | Admitting: Internal Medicine

## 2018-12-21 ENCOUNTER — Other Ambulatory Visit: Payer: Self-pay

## 2018-12-21 ENCOUNTER — Encounter: Payer: Self-pay | Admitting: Internal Medicine

## 2018-12-21 ENCOUNTER — Ambulatory Visit (AMBULATORY_SURGERY_CENTER): Payer: 59 | Admitting: Internal Medicine

## 2018-12-21 VITALS — BP 112/67 | HR 69 | Temp 97.1°F | Resp 10 | Ht 73.0 in | Wt 248.0 lb

## 2018-12-21 DIAGNOSIS — D120 Benign neoplasm of cecum: Secondary | ICD-10-CM

## 2018-12-21 DIAGNOSIS — Z86010 Personal history of colonic polyps: Secondary | ICD-10-CM

## 2018-12-21 DIAGNOSIS — D122 Benign neoplasm of ascending colon: Secondary | ICD-10-CM

## 2018-12-21 DIAGNOSIS — K635 Polyp of colon: Secondary | ICD-10-CM | POA: Diagnosis not present

## 2018-12-21 MED ORDER — SODIUM CHLORIDE 0.9 % IV SOLN: 500.0000 mL | Freq: Once | INTRAVENOUS | Status: DC

## 2018-12-21 NOTE — Progress Notes (Signed)
Pt's states no medical or surgical changes since previsit or office visit.   LC temps, [place] vitals and SH IV.

## 2018-12-21 NOTE — Progress Notes (Signed)
Report to PACU, RN, vss, BBS= Clear.  

## 2018-12-21 NOTE — Op Note (Signed)
[name] Patient Name: Roy [name] Procedure Date: 12/21/2018 8:46 AM MRN: [id number] Endoscopist: Jerene Bears , MD Age: 48 Referring MD:  Date of Birth: 1970/05/17 Gender: Male Account #: [id number] Procedure:                Colonoscopy Indications:              Surveillance: Personal history of adenomatous                            polyps on last colonoscopy 3 years ago Medicines:                Monitored Anesthesia Care Procedure:                Pre-Anesthesia Assessment:                           - Prior to the procedure, a History and Physical                            was performed, and patient medications and                            allergies were reviewed. The patient's tolerance of                            previous anesthesia was also reviewed. The risks                            and benefits of the procedure and the sedation                            options and risks were discussed with the patient.                            All questions were answered, and informed consent                            was obtained. Prior Anticoagulants: The patient has                            taken no previous anticoagulant or antiplatelet                            agents. ASA Grade Assessment: II - A patient with                            mild systemic disease. After reviewing the risks                            and benefits, the patient was deemed in                            satisfactory condition to undergo the procedure.  After obtaining informed consent, the colonoscope                            was passed under direct vision. Throughout the                            procedure, the patient's blood pressure, pulse, and                            oxygen saturations were monitored continuously. The                            Colonoscope was introduced through the anus and                            advanced to the cecum,  identified by appendiceal                            orifice and ileocecal valve. The colonoscopy was                            performed without difficulty. The patient tolerated                            the procedure well. The quality of the bowel                            preparation was good. The ileocecal valve,                            appendiceal orifice, and rectum were photographed. Scope In: 8:52:59 AM Scope Out: 9:06:44 AM Scope Withdrawal Time: 0 hours 11 minutes 25 seconds  Total Procedure Duration: 0 hours 13 minutes 45 seconds  Findings:                 The perianal and digital rectal examinations were                            normal.                           A 2 mm polyp was found in the cecum. The polyp was                            sessile. The polyp was removed with a cold snare.                            Resection and retrieval were complete.                           A 5 mm polyp was found in the ascending colon. The                            polyp was sessile. The polyp was  removed with a                            cold snare. Resection and retrieval were complete.                           Multiple small and large-mouthed diverticula were                            found in the sigmoid colon and descending colon.                            Petechia were visualized in association with the                            diverticular opening.                           Internal hemorrhoids were found during                            retroflexion. The hemorrhoids were small. Complications:            No immediate complications. Estimated Blood Loss:     Estimated blood loss was minimal. Impression:               - One 2 mm polyp in the cecum, removed with a cold                            snare. Resected and retrieved.                           - One 5 mm polyp in the ascending colon, removed                            with a cold snare. Resected and  retrieved.                           - Moderate diverticulosis in the sigmoid colon and                            in the descending colon. Petechia were visualized                            in association with the diverticular opening.                           - Internal hemorrhoids. Recommendation:           - Patient has a contact number available for                            emergencies. The signs and symptoms of potential                            delayed complications were discussed  with the                            patient. Return to normal activities tomorrow.                            Written discharge instructions were provided to the                            patient.                           - Resume previous diet.                           - Continue present medications.                           - Await pathology results.                           - Repeat colonoscopy is recommended for                            surveillance. The colonoscopy date will be                            determined after pathology results from today's                            exam become available for review. Jerene Bears, MD 12/21/2018 9:11:35 AM This report has been signed electronically.

## 2018-12-21 NOTE — Progress Notes (Signed)
Called to room to assist during endoscopic procedure.  Patient ID and intended procedure confirmed with present staff. Received instructions for my participation in the procedure from the performing physician.  

## 2018-12-21 NOTE — Patient Instructions (Signed)
HANDOUTS PROVIDED ON: POLYPS, DIVERTICULOSIS, & HEMORRHOIDS  THE POLYPS REMOVED TODAY HAVE BEEN SENT FOR PATHOLOGY.  THE RESULTS CAN TAKE 2-3 WEEKS TO RECEIVE.  THE TIMEFRAME BEFORE YOUR NEXT COLONOSCOPY WILL BE DETERMINED BASED ON THE PATHOLOGY RESULTS.    YOU MAY RESUME YOUR PREVIOUS DIET AND MEDICATION SCHEDULE.  [place] YOU FOR ALLOWING [place] TO CARE FOR YOU TODAY!!  YOU HAD AN ENDOSCOPIC PROCEDURE TODAY AT [facility]:   Refer to the procedure report that was given to you for any specific questions about what was found during the examination.  If the procedure report does not answer your questions, please call your gastroenterologist to clarify.  If you requested that your care partner not be given the details of your procedure findings, then the procedure report has been included in a sealed envelope for you to review at your convenience later.  YOU SHOULD EXPECT: Some feelings of bloating in the abdomen. Passage of more gas than usual.  Walking can help get rid of the air that was put into your GI tract during the procedure and reduce the bloating. If you had a lower endoscopy (such as a colonoscopy or flexible sigmoidoscopy) you may notice spotting of blood in your stool or on the toilet paper. If you underwent a bowel prep for your procedure, you may not have a normal bowel movement for a few days.  Please Note:  You might notice some irritation and congestion in your nose or some drainage.  This is from the oxygen used during your procedure.  There is no need for concern and it should clear up in a day or so.  SYMPTOMS TO REPORT IMMEDIATELY:   Following lower endoscopy (colonoscopy or flexible sigmoidoscopy):  Excessive amounts of blood in the stool  Significant tenderness or worsening of abdominal pains  Swelling of the abdomen that is new, acute  Fever of 100F or higher   For urgent or emergent issues, a gastroenterologist can be reached at any hour by calling [phone]  [phone].   DIET:  We do recommend a small meal at first, but then you may proceed to your regular diet.  Drink plenty of fluids but you should avoid alcoholic beverages for 24 hours.  ACTIVITY:  You should plan to take it easy for the rest of today and you should NOT DRIVE or use heavy machinery until tomorrow (because of the sedation medicines used during the test).    FOLLOW UP: Our staff will call the number listed on your records 48-72 hours following your procedure to check on you and address any questions or concerns that you may have regarding the information given to you following your procedure. If we do not reach you, we will leave a message.  We will attempt to reach you two times.  During this call, we will ask if you have developed any symptoms of COVID 19. If you develop any symptoms (ie: fever, flu-like symptoms, shortness of breath, cough etc.) before then, please call [phone].  If you test positive for Covid 19 in the 2 weeks post procedure, please call and report this information to [place].    If any biopsies were taken you will be contacted by phone or by letter within the next 1-3 weeks.  Please call us at [phone] if you have not heard about the biopsies in 3 weeks.    SIGNATURES/CONFIDENTIALITY: You and/or your care partner have signed paperwork which will be entered into your electronic medical record.  These signatures attest to the fact that that the information above on your After Visit Summary has been reviewed and is understood.  Full responsibility of the confidentiality of this discharge information lies with you and/or your care-partner.

## 2018-12-23 ENCOUNTER — Telehealth: Payer: Self-pay

## 2018-12-23 NOTE — Telephone Encounter (Signed)
Follow up call attempted.  NALM  

## 2018-12-23 NOTE — Telephone Encounter (Signed)
  Follow up Call-  Call back number 12/21/2018  Post procedure Call Back phone  # [phone]  Permission to leave phone message Yes  Some recent data might be hidden     Patient questions:  Do you have a fever, pain , or abdominal swelling? No. Pain Score  0 *  Have you tolerated food without any problems? Yes.    Have you been able to return to your normal activities? Yes.    Do you have any questions about your discharge instructions: Diet   No. Medications  No. Follow up visit  No.  Do you have questions or concerns about your Care? No.  Actions: * If pain score is 4 or above: No action needed, pain <4. 1. Have you developed a fever since your procedure? no  2.   Have you had an respiratory symptoms (SOB or cough) since your procedure? no  3.   Have you tested positive for COVID 19 since your procedure no  4.   Have you had any family members/close contacts diagnosed with the COVID 19 since your procedure?  no   If yes to any of these questions please route to Joylene John, RN and Alphonsa Gin, Therapist, sports.

## 2018-12-24 ENCOUNTER — Encounter: Payer: Self-pay | Admitting: Internal Medicine

## 2019-01-06 ENCOUNTER — Encounter: Payer: Self-pay | Admitting: Family Medicine

## 2019-04-18 ENCOUNTER — Other Ambulatory Visit: Payer: Self-pay | Admitting: Family Medicine

## 2019-07-13 ENCOUNTER — Telehealth: Payer: Self-pay

## 2019-07-13 NOTE — Telephone Encounter (Signed)
Signed and put on your desk. Signed:  Crissie Sickles, MD           07/13/2019

## 2019-07-13 NOTE — Telephone Encounter (Signed)
Prefilled some sections, Placed on PCP desk to review and sign, if appropriate.

## 2019-07-13 NOTE — Telephone Encounter (Signed)
Dropped off form needs completed for camp, has had Dr. Anitra Lauth completed them in the past years. He will need only PART C form completed. He needs to pick up on Monday 5/24. Please call when ready  Last CPE 08-11-18  Next CPE 08-12-19

## 2019-07-14 NOTE — Telephone Encounter (Signed)
Called patient to notify, forms ready for pick up if he wanted to come before Monday. Will place up front for pick up.

## 2019-08-12 ENCOUNTER — Telehealth: Payer: Self-pay

## 2019-08-12 ENCOUNTER — Ambulatory Visit (INDEPENDENT_AMBULATORY_CARE_PROVIDER_SITE_OTHER): Payer: 59 | Admitting: Family Medicine

## 2019-08-12 ENCOUNTER — Encounter: Payer: Self-pay | Admitting: Family Medicine

## 2019-08-12 ENCOUNTER — Other Ambulatory Visit: Payer: Self-pay

## 2019-08-12 VITALS — BP 105/77 | HR 62 | Temp 97.8°F | Resp 16 | Ht 72.0 in | Wt 237.6 lb

## 2019-08-12 DIAGNOSIS — R7303 Prediabetes: Secondary | ICD-10-CM | POA: Diagnosis not present

## 2019-08-12 DIAGNOSIS — E7800 Pure hypercholesterolemia, unspecified: Secondary | ICD-10-CM

## 2019-08-12 DIAGNOSIS — Z0000 Encounter for general adult medical examination without abnormal findings: Secondary | ICD-10-CM

## 2019-08-12 LAB — TSH: TSH: 2.01 u[IU]/mL (ref 0.35–4.50)

## 2019-08-12 LAB — HEMOGLOBIN A1C: Hgb A1c MFr Bld: 5.7 % (ref 4.6–6.5)

## 2019-08-12 LAB — CBC WITH DIFFERENTIAL/PLATELET
Basophils Absolute: 0 10*3/uL (ref 0.0–0.1)
Basophils Relative: 0.9 % (ref 0.0–3.0)
Eosinophils Absolute: 0.1 10*3/uL (ref 0.0–0.7)
Eosinophils Relative: 3.6 % (ref 0.0–5.0)
HCT: 44.5 % (ref 39.0–52.0)
Hemoglobin: 15.2 g/dL (ref 13.0–17.0)
Lymphocytes Relative: 32.3 % (ref 12.0–46.0)
Lymphs Abs: 1.2 10*3/uL (ref 0.7–4.0)
MCHC: 34.1 g/dL (ref 30.0–36.0)
MCV: 94.3 fl (ref 78.0–100.0)
Monocytes Absolute: 0.4 10*3/uL (ref 0.1–1.0)
Monocytes Relative: 11.6 % (ref 3.0–12.0)
Neutro Abs: 1.9 10*3/uL (ref 1.4–7.7)
Neutrophils Relative %: 51.6 % (ref 43.0–77.0)
Platelets: 144 10*3/uL — ABNORMAL LOW (ref 150.0–400.0)
RBC: 4.72 Mil/uL (ref 4.22–5.81)
RDW: 13.4 % (ref 11.5–15.5)
WBC: 3.8 10*3/uL — ABNORMAL LOW (ref 4.0–10.5)

## 2019-08-12 LAB — LIPID PANEL
Cholesterol: 229 mg/dL — ABNORMAL HIGH (ref 0–200)
HDL: 38.8 mg/dL — ABNORMAL LOW (ref >39.00)
LDL Cholesterol: 170 mg/dL — ABNORMAL HIGH (ref 0–99)
NonHDL: 189.8
Total CHOL/HDL Ratio: 6
Triglycerides: 97 mg/dL (ref 0.0–149.0)
VLDL: 19.4 mg/dL (ref 0.0–40.0)

## 2019-08-12 LAB — COMPREHENSIVE METABOLIC PANEL
ALT: 11 U/L (ref 0–53)
AST: 16 U/L (ref 0–37)
Albumin: 4.5 g/dL (ref 3.5–5.2)
Alkaline Phosphatase: 108 U/L (ref 39–117)
BUN: 17 mg/dL (ref 6–23)
CO2: 30 mEq/L (ref 19–32)
Calcium: 9.6 mg/dL (ref 8.4–10.5)
Chloride: 102 mEq/L (ref 96–112)
Creatinine, Ser: 1.2 mg/dL (ref 0.40–1.50)
GFR: 64.45 mL/min (ref >60.00)
Glucose, Bld: 101 mg/dL — ABNORMAL HIGH (ref 70–99)
Potassium: 4.6 mEq/L (ref 3.5–5.1)
Sodium: 139 mEq/L (ref 135–145)
Total Bilirubin: 0.8 mg/dL (ref 0.2–1.2)
Total Protein: 7.5 g/dL (ref 6.0–8.3)

## 2019-08-12 NOTE — Telephone Encounter (Signed)
Patient is having CPE appt today and needs form completed to participate in scouts. Form given to provider for further completion. Copy will be made for chart and original given to patient at the end of visit.

## 2019-08-12 NOTE — Patient Instructions (Signed)

## 2019-08-12 NOTE — Progress Notes (Signed)
Office Note 08/12/2019  CC:  Chief Complaint  Patient presents with  . Annual Exam    pt is fasting    HPI:  Roy [name] is a 49 y.o. White male who is here for annual health maintenance exam.  Feeling well. Lost a good amount of wt with intermittent fasting method. Exercising: sporadic walking, hiking, toning exercises.  Going to [employer] in [place] to hike/wilderness adventure with [place].  Past Medical History:  Diagnosis Date  . Allergy   . Chronic rhinitis    Deviated nasal septum, hypertrophy of nasal turbinates: Dr. Dustin Flock turb reduct and septoplasty.  . Colitis   . Diverticulosis 2009   Colonoscopy confirmed approx 2009 (diverticulitis x 2 episodes).  . Elevated PSA 08/2016   Urol eval: plan is to repeat PSA's to follow trend: PSA 01/08/17: 3.52. (no bx as of 01/2017).  I will repeat PSA at his CPE 07/2017.  He returns for another PSA w/Dr. Herrick 12/2017--this was 2.49.  Needs annual PSAs with either urologist or me.  [name] GERD (gastroesophageal reflux disease)   . Glaucoma   . History of adenomatous polyp of colon 12/11/2015;11/2018   Recall 11/2023.  [name] Hypercholesterolemia 2018,2019,2020   Mild; Framingham CV risk<<7.5%.  TLC.  . Irritable bowel syndrome with diarrhea   . OSA on CPAP    Sleep study in remote past was normal per pt report.  OSA eval w/[facility] 2018.  AHI 19/hr, desat to 81%.  Titration study 01/21/2017, optimal CPAP 6 cm H20.   . Rotator cuff impingement syndrome 2018   [facility] (Dr. Tamala Julian).  Ongoing R shoulder pain: labral tear treated non-operatively=failed, so referred to ortho 11/2016.  [name] Sleep apnea   . Thrombocytopenia ([facility]) 2014   Mild; stable 2014-2016.  Abd u/s 02/2014 NORMAL.    Past Surgical History:  Procedure Laterality Date  . BICEPS TENDON REPAIR    . COLONOSCOPY W/ POLYPECTOMY  12/11/2015; 12/21/18   2017 Tubular adenoma x 1, +Diverticulosis and int hem.  Rpt 11/2018 with 1 sessile serrated poly,  diverticulosis: recall 5 yrs.  [name] EYE SURGERY     lasik  . KNEE ARTHROSCOPY Bilateral    [facility]; 2003, 2004, 2010  . [place] SURGERY  01/2018  . NASAL SEPTOPLASTY W/ TURBINOPLASTY Bilateral 04/14/2015   Procedure: NASAL SEPTOPLASTY WITH BILATERAL INFERIOR  TURBINATE REDUCTION;  Surgeon: Jerrell Belfast, MD;  Location: [facility];  Service: ENT;  Laterality: Bilateral;  . SHOULDER ARTHROSCOPY Bilateral    [facility]; 1993, 2000, 2007  . WISDOM TOOTH EXTRACTION     young adult  . WRIST FRACTURE SURGERY Left 2010   Hockey injury, plate inserted    Family History  Problem Relation Age of Onset  . Pancreatic cancer Paternal Grandfather   . Lung cancer Paternal Grandfather         (smoker)  . Colon cancer Maternal Grandmother   . Colon polyps Father   . Diverticulitis Mother   . Esophageal cancer Neg Hx   . Stomach cancer Neg Hx   . Rectal cancer Neg Hx     Social History   Socioeconomic History  . Marital status: Married    Spouse name: Not on file  . Number of children: 4  . Years of education: Not on file  . Highest education level: Not on file  Occupational History  . Occupation: Chief Financial Officer  Tobacco Use  . Smoking status: Never Smoker  . Smokeless tobacco: Never Used  Vaping Use  . Vaping Use: Never  used  Substance and Sexual Activity  . Alcohol use: Yes    Alcohol/week: 1.0 standard drink    Types: 1 Cans of beer per week    Comment: beer occasional  . Drug use: No  . Sexual activity: Not on file  Other Topics Concern  . Not on file  Social History Narrative   Married, 4 children from ages 59 yrs to 31 yrs.  No sibs.   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   Grad from [employer] in [place], [place].   Originally from [place], lived in [place] for a while, then relocated to [facility] [place]) about 2010.   Exercises irregularly, no particular dietary focus.   No T/A/Ds.   Social Determinants of Health   Financial Resource Strain:   . Difficulty of  Paying Living Expenses:   Food Insecurity:   . Worried About Charity fundraiser in the Last Year:   . Arboriculturist in the Last Year:   Transportation Needs:   . Film/video editor (Medical):   [name] Lack of Transportation (Non-Medical):   Physical Activity:   . Days of Exercise per Week:   . Minutes of Exercise per Session:   Stress:   . Feeling of Stress :   Social Connections:   . Frequency of Communication with Friends and Family:   . Frequency of Social Gatherings with Friends and Family:   . Attends Religious Services:   . Active Member of Clubs or Organizations:   . Attends Archivist Meetings:   [name] Marital Status:   Intimate Partner Violence:   . Fear of Current or Ex-Partner:   . Emotionally Abused:   [name] Physically Abused:   . Sexually Abused:     Outpatient Medications Prior to Visit  Medication Sig Dispense Refill  . BETIMOL 0.25 % ophthalmic solution Place 1 drop into both eyes 2 (two) times daily.  6  . latanoprost (XALATAN) 0.005 % ophthalmic solution Place 1 drop into both eyes at bedtime.    [name] loratadine (ALLERGY) 10 MG tablet Take 10 mg by mouth daily.    [name] omeprazole (PRILOSEC) 40 MG capsule TAKE 1 CAPSULE BY MOUTH EVERY DAY 90 capsule 1  . meloxicam (MOBIC) 7.5 MG tablet TAKE 1 TABLET BY MOUTH EVERY DAY (Patient not taking: Reported on 08/12/2019) 30 tablet 0   No facility-administered medications prior to visit.    Allergies  Allergen Reactions  . Dayquil Multi-Symptom Cold-Flu [Pseudoephedrine-Apap-Dm] Hives    Review of Systems  Constitutional: Negative for appetite change, chills, fatigue and fever.  HENT: Negative for congestion, dental problem, ear pain and sore throat.   Eyes: Negative for discharge, redness and visual disturbance.  Respiratory: Negative for cough, chest tightness, shortness of breath and wheezing.   Cardiovascular: Negative for chest pain, palpitations and leg swelling.  Gastrointestinal: Negative for abdominal  pain, blood in stool, diarrhea, nausea and vomiting.  Genitourinary: Negative for difficulty urinating, dysuria, flank pain, frequency, hematuria and urgency.  Musculoskeletal: Negative for arthralgias, back pain, joint swelling, myalgias and neck stiffness.  Skin: Negative for pallor and rash.  Neurological: Negative for dizziness, speech difficulty, weakness and headaches.  Hematological: Negative for adenopathy. Does not bruise/bleed easily.  Psychiatric/Behavioral: Negative for confusion and sleep disturbance. The patient is not nervous/anxious.      PE; Vitals with BMI 08/12/2019 12/21/2018 12/21/2018  Height 6\' 0"  - -  Weight 237 lbs 10 oz - -  BMI 18.29 - -  Systolic 937  765 465  Diastolic 77 67 69  Pulse 62 69 75    Gen: Alert, well appearing.  Patient is oriented to person, place, time, and situation. AFFECT: pleasant, lucid thought and speech. ENT: Ears: EACs clear, normal epithelium.  TMs with good light reflex and landmarks bilaterally.  Eyes: no injection, icteris, swelling, or exudate.  EOMI, PERRLA. Nose: no drainage or turbinate edema/swelling.  No injection or focal lesion.  Mouth: lips without lesion/swelling.  Oral mucosa pink and moist.  Dentition intact and without obvious caries or gingival swelling.  Oropharynx without erythema, exudate, or swelling.  Neck: supple/nontender.  No LAD, mass, or TM.  Carotid pulses 2+ bilaterally, without bruits. CV: RRR, no m/r/g.   LUNGS: CTA bilat, nonlabored resps, good aeration in all lung fields. ABD: soft, NT, ND, BS normal.  No hepatospenomegaly or mass.  No bruits. EXT: no clubbing, cyanosis, or edema.  Musculoskeletal: no joint swelling, erythema, warmth, or tenderness.  ROM of all joints intact. Skin - no sores or suspicious lesions or rashes or color changes   Pertinent labs:  Lab Results  Component Value Date   TSH 2.19 08/11/2018   Lab Results  Component Value Date   WBC 4.3 08/11/2018   HGB 15.4 08/11/2018    HCT 45.5 08/11/2018   MCV 94.5 08/11/2018   PLT 145.0 (L) 08/11/2018   Lab Results  Component Value Date   CREATININE 1.07 08/11/2018   BUN 19 08/11/2018   NA 138 08/11/2018   K 4.4 08/11/2018   CL 104 08/11/2018   CO2 28 08/11/2018   Lab Results  Component Value Date   ALT 17 08/11/2018   AST 18 08/11/2018   ALKPHOS 108 08/11/2018   BILITOT 0.8 08/11/2018   Lab Results  Component Value Date   CHOL 230 (H) 08/11/2018   Lab Results  Component Value Date   HDL 32.50 (L) 08/11/2018   Lab Results  Component Value Date   LDLCALC 203 (H) 08/04/2015   Lab Results  Component Value Date   TRIG 268.0 (H) 08/11/2018   Lab Results  Component Value Date   CHOLHDL 7 08/11/2018   Lab Results  Component Value Date   PSA 4.29 (H) 07/31/2016   PSA 3.53 08/04/2015   Lab Results  Component Value Date   HGBA1C 5.9 08/13/2018    ASSESSMENT AND PLAN:   Health maintenance exam: Reviewed age and gender appropriate health maintenance issues (prudent diet, regular exercise, health risks of tobacco and excessive alcohol, use of seatbelts, fire alarms in home, use of sunscreen).  Also reviewed age and gender appropriate health screening as well as vaccine recommendations. Vaccines: all UTD.   Labs: Fasting HP, HbA1c (prediabetes). Prostate ca screening: hx of mild PSA elevation, has seen urologist, annual PSAs being done by me OR urologist but pt is reassured by the last couple checks being stable-->he declines this testing today,wants to wait until he's 50 restart screening. Colon ca screening: next colonoscopy 11/2023.  Health form for [facility] participation/leadership completed and signed today: no restrictions.  An After Visit Summary was printed and given to the patient.  FOLLOW UP:  Return in about 1 year (around 08/11/2020) for annual CPE (fasting).  Signed:  Crissie Sickles, MD           08/12/2019 .[place]

## 2019-08-13 ENCOUNTER — Encounter: Payer: Self-pay | Admitting: Family Medicine

## 2019-09-24 ENCOUNTER — Other Ambulatory Visit: Payer: Self-pay

## 2019-09-24 ENCOUNTER — Encounter: Payer: Self-pay | Admitting: Family Medicine

## 2019-09-24 ENCOUNTER — Ambulatory Visit (INDEPENDENT_AMBULATORY_CARE_PROVIDER_SITE_OTHER): Payer: 59 | Admitting: Family Medicine

## 2019-09-24 VITALS — BP 110/76 | HR 53 | Temp 98.6°F | Resp 16 | Ht 72.0 in | Wt 237.2 lb

## 2019-09-24 DIAGNOSIS — M109 Gout, unspecified: Secondary | ICD-10-CM | POA: Diagnosis not present

## 2019-09-24 MED ORDER — INDOMETHACIN 50 MG PO CAPS
ORAL_CAPSULE | ORAL | 3 refills | Status: DC
Start: 2019-09-24 — End: 2023-03-21

## 2019-09-24 NOTE — Progress Notes (Signed)
CC toe pain  HPI: Onset 3 d/a pain in L MTP jt, swelled some, a bit darker color of skin over joint, hurt a LOT to walk on it. Just touching it on shoe when putting shoe on made it hurt worse.  No fever or chills, no recent trauma. The pain and stiffness has improved signif in the last 12-24 h. Prior to onset, he had spent 3-4 days doing nothing but laying on couch, ate some smoked ribs, drank a few beers.  He had just gotten back from 2 wks of hiking but his toe did not bother him while hiking.  No known hx of gouty arthritis but looking back he does have uric acid check back in 2014 at which time he had been having a bit of pain in big toe of L foot. This was mild/mod intensity and not assoc with swelling or redness and had resolved a couple days before I saw him in office---question of tendon/ligament/joint capsulitis, lower suspicion of gout at that time.  ROS: See pertinent positives and negatives per HPI.  Past Medical History:  Diagnosis Date  . Allergy   . Chronic rhinitis    Deviated nasal septum, hypertrophy of nasal turbinates: Dr. Dustin Flock turb reduct and septoplasty.  . Colitis   . Diverticulosis 2009   Colonoscopy confirmed approx 2009 (diverticulitis x 2 episodes).  . Elevated PSA 08/2016   Urol eval: plan is to repeat PSA's to follow trend: PSA 01/08/17: 3.52. (no bx as of 01/2017).  I will repeat PSA at his CPE 07/2017.  He returns for another PSA w/Dr. Herrick 12/2017--this was 2.49.  Needs annual PSAs with either urologist or me.  [name] GERD (gastroesophageal reflux disease)   . Glaucoma   . History of adenomatous polyp of colon 12/11/2015;11/2018   Recall 11/2023.  [name] Hypercholesterolemia 2018,2019,2020   Mild; Framingham CV risk<<7.5%.  TLC.  07/2019 I recommended he see advanced lipid clinic provider.  . Irritable bowel syndrome with diarrhea   . Obesity, Class I, BMI 30-34.9   . OSA on CPAP    Sleep study in remote past was normal per pt report.  OSA eval  w/[place] 2018.  AHI 19/hr, desat to 81%.  Titration study 01/21/2017, optimal CPAP 6 cm H20.   . Rotator cuff impingement syndrome 2018   Bilat (Dr. Tamala Julian).  Ongoing R shoulder pain: labral tear treated non-operatively=failed, so referred to ortho 11/2016.  [name] Sleep apnea   . Thrombocytopenia ([place]) 2014   Mild; stable 2014-2016.  Abd u/s 02/2014 NORMAL.    Past Surgical History:  Procedure Laterality Date  . BICEPS TENDON REPAIR    . COLONOSCOPY W/ POLYPECTOMY  12/11/2015; 12/21/18   2017 Tubular adenoma x 1, +Diverticulosis and int hem.  Rpt 11/2018 with 1 sessile serrated poly, diverticulosis: recall 5 yrs.  [name] EYE SURGERY     lasik  . KNEE ARTHROSCOPY Bilateral    Bilat; 2003, 2004, 2010  . [place] SURGERY  01/2018  . NASAL SEPTOPLASTY W/ TURBINOPLASTY Bilateral 04/14/2015   Procedure: NASAL SEPTOPLASTY WITH BILATERAL INFERIOR  TURBINATE REDUCTION;  Surgeon: Jerrell Belfast, MD;  Location: [facility];  Service: ENT;  Laterality: Bilateral;  . SHOULDER ARTHROSCOPY Bilateral    Bilat; 1993, 2000, 2007  . WISDOM TOOTH EXTRACTION     young adult  . WRIST FRACTURE SURGERY Left 2010   Hockey injury, plate inserted    Family History  Problem Relation Age of Onset  . Pancreatic cancer Paternal Grandfather   .  Lung cancer Paternal Grandfather         (smoker)  . Colon cancer Maternal Grandmother   . Colon polyps Father   . Diverticulitis Mother   . Esophageal cancer Neg Hx   . Stomach cancer Neg Hx   . Rectal cancer Neg Hx      Current Outpatient Medications:  .  BETIMOL 0.25 % ophthalmic solution, Place 1 drop into both eyes 2 (two) times daily., Disp: , Rfl: 6 .  latanoprost (XALATAN) 0.005 % ophthalmic solution, Place 1 drop into both eyes at bedtime., Disp: , Rfl:  .  loratadine (ALLERGY) 10 MG tablet, Take 10 mg by mouth daily., Disp: , Rfl:  .  omeprazole (PRILOSEC) 40 MG capsule, TAKE 1 CAPSULE BY MOUTH EVERY DAY, Disp: 90 capsule, Rfl: 1  EXAM:  VITALS per patient if  applicable:  Vitals with BMI 08/12/2019 12/21/2018 12/21/2018  Height 6\' 0"  - -  Weight 237 lbs 10 oz - -  BMI 32.99 - -  Systolic [phone]  Diastolic 77 67 69  Pulse 62 69 75   Gen: Alert, well appearing.  Patient is oriented to person, place, time, and situation. AFFECT: pleasant, lucid thought and speech. L great toe MTP joint with mild swelling, mild deep pink/hyperpigmented hue, mild warmth.  Mild TTP. Otherwise foot was normal.  Ankle normal.  LABS: none today Lab Results  Component Value Date   TSH 2.01 08/12/2019   Lab Results  Component Value Date   WBC 3.8 (L) 08/12/2019   HGB 15.2 08/12/2019   HCT 44.5 08/12/2019   MCV 94.3 08/12/2019   PLT 144.0 (L) 08/12/2019   Lab Results  Component Value Date   CREATININE 1.20 08/12/2019   BUN 17 08/12/2019   NA 139 08/12/2019   K 4.6 08/12/2019   CL 102 08/12/2019   CO2 30 08/12/2019   Lab Results  Component Value Date   ALT 11 08/12/2019   AST 16 08/12/2019   ALKPHOS 108 08/12/2019   BILITOT 0.8 08/12/2019   Lab Results  Component Value Date   CHOL 229 (H) 08/12/2019   Lab Results  Component Value Date   HDL 38.80 (L) 08/12/2019   Lab Results  Component Value Date   LDLCALC 170 (H) 08/12/2019   Lab Results  Component Value Date   TRIG 97.0 08/12/2019   Lab Results  Component Value Date   CHOLHDL 6 08/12/2019   Lab Results  Component Value Date   PSA 4.29 (H) 07/31/2016   PSA 3.53 08/04/2015   Lab Results  Component Value Date   HGBA1C 5.7 08/12/2019   Lab Results  Component Value Date   LABURIC 7.5 09/16/2012    ASSESSMENT AND PLAN:  Discussed the following assessment and plan:  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 He will likely hold off on any treatment at this time. Discussed use of indocin 50mg  tid if flare up continues again or get recurrence. Discussed low purine diet.  Unfortunately he loves seafood, red meat, and likes to drink bee occasionally. He does not want to give  these things up, understandably, and if flares occur with dietary indiscretion in the future he favors starting allopurinol preventative. No uric acid level done today.   F/u: if not improving appropriately or if recurrent flares.  Signed:  Crissie Sickles, MD           09/24/2019

## 2019-10-27 ENCOUNTER — Telehealth: Payer: Self-pay

## 2019-10-27 MED ORDER — OMEPRAZOLE 40 MG PO CPDR: DELAYED_RELEASE_CAPSULE | ORAL | 1 refills | Status: DC

## 2019-10-27 NOTE — Telephone Encounter (Signed)
Pharmacy told patient to contact our office for refill of omeprazole (PRILOSEC) 40 MG capsule to be sent to [facility].

## 2019-10-27 NOTE — Telephone Encounter (Signed)
Rx sent 

## 2020-04-22 ENCOUNTER — Other Ambulatory Visit: Payer: Self-pay | Admitting: Family Medicine

## 2020-04-28 ENCOUNTER — Encounter: Payer: Self-pay | Admitting: Family Medicine

## 2020-04-28 DIAGNOSIS — Z1283 Encounter for screening for malignant neoplasm of skin: Secondary | ICD-10-CM

## 2020-04-30 NOTE — Telephone Encounter (Signed)
OK, derm referral ordered.

## 2020-05-18 NOTE — Progress Notes (Signed)
[name] Phone: [phone] Subjective:   [name], am serving as a scribe for Dr. Hulan Saas. This visit occurred during the SARS-CoV-2 public health emergency.  Safety protocols were in place, including screening questions prior to the visit, additional usage of staff PPE, and extensive cleaning of exam room while observing appropriate contact time as indicated for disinfecting solutions.   I'm seeing this patient by the request  of:  [name], [name], MD  CC: left shoulder pain recommendation on back.  [id number]   09/01/2018 Patient does have more of a subacromial bursitis of the shoulder as well as after patient relocation of the bicep tendon likely continue to have some discomfort and pain.  X-rays of the neck to further evaluate for any radicular symptoms or could be contributing.  Meloxicam given.  Icing regimen, topical anti-inflammatories.  Follow-up again in 4 to 6 weeks  Update 05/23/2020 Roy [name] is a 50 y.o. male coming in with complaint of L shoulder pain. Pain is deep in the joint. Pain with flexion when he holds is fishing rod over head. Pain increases with sleeping on opposite shoulder and working out.    Back surgery 2019 to fix ruptured disc. Next surgery is suppose to help alleviate pinched nerve at L5 that is causing radicular symptoms on left leg. Patient is scheduled for surgery in June.  Patient did have an MRI in January 2022.  Patient was found to have a 5 mm grade 1 anterior listhesis of L5 on S1 with bilateral L5 spondylolysis.  Facet arthropathy at multiple other levels.    neck xrays show DDD at C4-6 with foraminal stenosis left greater than right   MRI of right shoulder 2018- slight fraying of anterior labral but no discrete tear Type 2 acromion   Past Medical History:  Diagnosis Date  . Allergy   . Chronic rhinitis    Deviated nasal septum, hypertrophy of  nasal turbinates: Dr. Dustin Flock turb reduct and septoplasty.  . Colitis   . Diverticulosis 2009   Colonoscopy confirmed approx 2009 (diverticulitis x 2 episodes).  . Elevated PSA 08/2016   Urol eval: plan is to repeat PSA's to follow trend: PSA 01/08/17: 3.52. (no bx as of 01/2017).  I will repeat PSA at his CPE 07/2017.  He returns for another PSA w/Dr. Herrick 12/2017--this was 2.49.  Needs annual PSAs with either urologist or me.  [name] GERD (gastroesophageal reflux disease)   . Glaucoma   . History of adenomatous polyp of colon 12/11/2015;11/2018   Recall 11/2023.  [name] Hypercholesterolemia 2018,2019,2020   Mild; Framingham CV risk<<7.5%.  TLC.  07/2019 I recommended he see advanced lipid clinic provider.  . Irritable bowel syndrome with diarrhea   . Obesity, Class I, BMI 30-34.9   . OSA on CPAP    Sleep study in remote past was normal per pt report.  OSA eval w/[place] 2018.  AHI 19/hr, desat to 81%.  Titration study 01/21/2017, optimal CPAP 6 cm H20.   . Rotator cuff impingement syndrome 2018   Bilat (Dr. Tamala Julian).  Ongoing R shoulder pain: labral tear treated non-operatively=failed, so referred to ortho 11/2016.  [name] Sleep apnea   . Thrombocytopenia ([place]) 2014   Mild; stable 2014-2016.  Abd u/s 02/2014 NORMAL.   Past Surgical History:  Procedure Laterality Date  . BICEPS TENDON REPAIR    . COLONOSCOPY W/ POLYPECTOMY  12/11/2015; 12/21/18   2017 Tubular adenoma x 1, +Diverticulosis  and int hem.  Rpt 11/2018 with 1 sessile serrated poly, diverticulosis: recall 5 yrs.  [name] EYE SURGERY     lasik  . KNEE ARTHROSCOPY Bilateral    Bilat; 2003, 2004, 2010  . [place] SURGERY  01/2018  . NASAL SEPTOPLASTY W/ TURBINOPLASTY Bilateral 04/14/2015   Procedure: NASAL SEPTOPLASTY WITH BILATERAL INFERIOR  TURBINATE REDUCTION;  Surgeon: Jerrell Belfast, MD;  Location: [facility];  Service: ENT;  Laterality: Bilateral;  . SHOULDER ARTHROSCOPY Bilateral    Bilat; 1993, 2000, 2007  . WISDOM TOOTH EXTRACTION      young adult  . WRIST FRACTURE SURGERY Left 2010   Hockey injury, plate inserted   Social History   Socioeconomic History  . Marital status: Married    Spouse name: Not on file  . Number of children: 4  . Years of education: Not on file  . Highest education level: Not on file  Occupational History  . Occupation: Chief Financial Officer  Tobacco Use  . Smoking status: Never Smoker  . Smokeless tobacco: Never Used  Vaping Use  . Vaping Use: Never used  Substance and Sexual Activity  . Alcohol use: Yes    Alcohol/week: 1.0 standard drink    Types: 1 Cans of beer per week    Comment: beer occasional  . Drug use: No  . Sexual activity: Not on file  Other Topics Concern  . Not on file  Social History Narrative   Married, 4 children from ages 69 yrs to 7 yrs.  No sibs.   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   Grad from [employer] in [place], [place].   Originally from [place], lived in [place] for a while, then relocated to [facility] [place]) about 2010.   Exercises irregularly, no particular dietary focus.   No T/A/Ds.   Social Determinants of Health   Financial Resource Strain: Not on file  Food Insecurity: Not on file  Transportation Needs: Not on file  Physical Activity: Not on file  Stress: Not on file  Social Connections: Not on file   Allergies  Allergen Reactions  . Dayquil Multi-Symptom Cold-Flu [Pseudoephedrine-Apap-Dm] Hives   Family History  Problem Relation Age of Onset  . Pancreatic cancer Paternal Grandfather   . Lung cancer Paternal Grandfather         (smoker)  . Colon cancer Maternal Grandmother   . Colon polyps Father   . Diverticulitis Mother   . Esophageal cancer Neg Hx   . Stomach cancer Neg Hx   . Rectal cancer Neg Hx       Current Outpatient Medications (Respiratory):  .  loratadine (CLARITIN) 10 MG tablet, Take 10 mg by mouth daily.  Current Outpatient Medications (Analgesics):  .  indomethacin (INDOCIN) 50 MG capsule, 1  cap po tid prn for gout. Take with food.   Current Outpatient Medications (Other):  [name]  BETIMOL 0.25 % ophthalmic solution, Place 1 drop into both eyes 2 (two) times daily. [name]  latanoprost (XALATAN) 0.005 % ophthalmic solution, Place 1 drop into both eyes at bedtime. [name]  omeprazole (PRILOSEC) 40 MG capsule, TAKE 1 CAPSULE BY MOUTH EVERY DAY   Reviewed prior external information including notes and imaging from  primary care provider As well as notes that were available from care everywhere and other healthcare systems.  Past medical history, social, surgical and family history all reviewed in electronic medical record.  No pertanent information unless stated regarding to the chief complaint.   Review of Systems:  No headache, visual  changes, nausea, vomiting, diarrhea, constipation, dizziness, abdominal pain, skin rash, fevers, chills, night sweats, weight loss, swollen lymph nodes, b joint swelling, chest pain, shortness of breath, mood changes. POSITIVE muscle aches, body aches  Objective  Blood pressure 118/88, pulse (!) 105, height 6' (1.829 m), weight 255 lb (115.7 kg), SpO2 98 %.   General: No apparent distress alert and oriented x3 mood and affect normal, dressed appropriately.  HEENT: Pupils equal, extraocular movements intact  Respiratory: Patient's speak in full sentences and does not appear short of breath  Cardiovascular: No lower extremity edema, non tender, no erythema  Gait normal with good balance and coordination.  MSK: Left shoulder exam shows the patient does have some limitation in external rotation of 5 degrees.  Positive impingement with Neer and Hawkins.  Rotator cuff does appear to be strong initially but then patient does have a positive drop arm sign.  Patient does have positive O'Brien's with labral pathology noted. Low back exam mild loss of lordosis.  Significant tightness noted with straight leg test.  Patient is worsening pain with greater than 5 degrees of  extension of the back.  Mild radicular symptoms mostly down the left leg more in the gluteal area no significant weakness noted today though.  Limited musculoskeletal ultrasound was performed and interpreted by Lyndal Pulley  Limited ultrasound of patient's left shoulder shows that patient does have a subacromial bursitis as well as very mild bicep tendinitis with hypoechoic changes within the tendon sheath.  Patient does have some abnormality of the supraspinatus tendon noted but it does appear near the anterior of the previous repair which could be giving a potential acoustic shadow.  Moderate to severe arthritic changes of the acromioclavicular joint noted.  Patient does have a fusion of the acromioclavicular joint. Impression: Arthritis of the acromioclavicular joint, questionable irregularity of the supraspinatus with reactive bursitis   Impression and Recommendations:     The above documentation has been reviewed and is accurate and complete Lyndal Pulley, DO

## 2020-05-22 NOTE — Telephone Encounter (Signed)
Pt can call [facility] in [place], [facility] in [place], and [facility] in [place] and inquire about earliest appt opening. If he gets an earlier appt and needs new referral he can send message back to me requesting this.

## 2020-05-22 NOTE — Telephone Encounter (Signed)
Derm referral ordered but appt is not until Mid-August.   Pt last seen 08/12/19 and is not due for next appointment until 07/2020.   Skin: Negative for pallor and rash.   Prostate ca screening: hx of mild PSA elevation, has seen urologist, annual PSAs being done by me OR urologist but pt is reassured by the last couple checks being stable-->he declines this testing today,wants to wait until he's 50 restart screening.   Message text     Please advise if pt should wait or if there is another office he can be referred to?

## 2020-05-23 ENCOUNTER — Encounter: Payer: Self-pay | Admitting: Family Medicine

## 2020-05-23 ENCOUNTER — Ambulatory Visit (INDEPENDENT_AMBULATORY_CARE_PROVIDER_SITE_OTHER): Payer: 59 | Admitting: Family Medicine

## 2020-05-23 ENCOUNTER — Other Ambulatory Visit: Payer: Self-pay

## 2020-05-23 ENCOUNTER — Ambulatory Visit (INDEPENDENT_AMBULATORY_CARE_PROVIDER_SITE_OTHER): Payer: 59

## 2020-05-23 ENCOUNTER — Ambulatory Visit: Payer: Self-pay

## 2020-05-23 VITALS — BP 118/88 | HR 105 | Ht 72.0 in | Wt 255.0 lb

## 2020-05-23 DIAGNOSIS — M545 Low back pain, unspecified: Secondary | ICD-10-CM

## 2020-05-23 DIAGNOSIS — M542 Cervicalgia: Secondary | ICD-10-CM

## 2020-05-23 DIAGNOSIS — G8929 Other chronic pain: Secondary | ICD-10-CM | POA: Diagnosis not present

## 2020-05-23 DIAGNOSIS — M25511 Pain in right shoulder: Secondary | ICD-10-CM | POA: Diagnosis not present

## 2020-05-23 DIAGNOSIS — M25512 Pain in left shoulder: Secondary | ICD-10-CM | POA: Insufficient documentation

## 2020-05-23 DIAGNOSIS — M4307 Spondylolysis, lumbosacral region: Secondary | ICD-10-CM

## 2020-05-23 NOTE — Assessment & Plan Note (Signed)
Patient does have what appears to be more of a spondylolisthesis with grade 1 at L5-S1.  He does have moderate to severe facet arthropathy bilaterally at this level as well as moderately at the L4-L5 level.  Patient has seen a surgeon and they are discussing the possibility of an anterior and posterior fusion at this level.  Patient would like second opinion just to make sure he is doing the right thing we will refer him to discuss with another surgeon.

## 2020-05-23 NOTE — Patient Instructions (Addendum)
Xray on the way out MRA of the left shoulder Referral to Dr. Lynann Bologna for back pain to make sure you are going down the right route See me again in 6 weeks we will discuss

## 2020-05-23 NOTE — Assessment & Plan Note (Signed)
Patient is having some left shoulder weakness.  He does have known degenerative disc disease at C4-C6 with moderate foraminal stenosis on the left side.  We will get a repeat x-rays with these being nearly 50 years old.  In addition to this though on ultrasound it does appear that patient did have a rotator cuff repair previously and may have a new tear of the supraspinatus.  Patient has already done some outside physical therapy.  This is been going on for multiple months at this time.  Now associated with some weakness and waking him up at night.  Ultrasound did show acromioclavicular arthritis with effusion and does have bursitis in addition to the abnormal findings of the supraspinatus tendon.  Patient will have x-rays today and then will get an MR arthrogram to further evaluate.  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  Depending on imaging we will discuss what treatment is best.

## 2020-05-24 ENCOUNTER — Encounter: Payer: Self-pay | Admitting: Family Medicine

## 2020-06-19 ENCOUNTER — Ambulatory Visit
Admission: RE | Admit: 2020-06-19 | Discharge: 2020-06-19 | Disposition: A | Discharge status: Home / Self Care | Payer: 59 | Source: Ambulatory Visit | Attending: Family Medicine | Admitting: Family Medicine

## 2020-06-19 ENCOUNTER — Encounter: Payer: Self-pay | Admitting: Family Medicine

## 2020-06-19 DIAGNOSIS — G8929 Other chronic pain: Secondary | ICD-10-CM

## 2020-06-19 MED ORDER — IOPAMIDOL (ISOVUE-M 200) INJECTION 41%
15.0000 mL | Freq: Once | INTRAMUSCULAR | Status: AC
  Administered 2020-06-19: 15 mL via INTRA_ARTICULAR

## 2020-06-20 ENCOUNTER — Encounter: Payer: Self-pay | Admitting: Family Medicine

## 2020-06-28 ENCOUNTER — Encounter: Payer: Self-pay | Admitting: Family Medicine

## 2020-06-28 ENCOUNTER — Other Ambulatory Visit: Payer: Self-pay

## 2020-06-28 ENCOUNTER — Ambulatory Visit (INDEPENDENT_AMBULATORY_CARE_PROVIDER_SITE_OTHER): Payer: 59 | Admitting: Family Medicine

## 2020-06-28 DIAGNOSIS — M25512 Pain in left shoulder: Secondary | ICD-10-CM | POA: Diagnosis not present

## 2020-06-28 DIAGNOSIS — G8929 Other chronic pain: Secondary | ICD-10-CM

## 2020-06-28 NOTE — Patient Instructions (Signed)
3 IBU 3x a day for 3 days Exercises 3x a week up until surgery Ice 20 min each day See me 3 months

## 2020-06-28 NOTE — Progress Notes (Signed)
[place] Phone: [phone] Subjective:   Roy Roy [name], am serving as a scribe for Dr. Hulan [name]. This visit occurred during the SARS-CoV-2 public health emergency.  Safety protocols were in place, including screening questions prior to the visit, additional usage of staff PPE, and extensive cleaning of exam room while observing appropriate contact time as indicated for disinfecting solutions.   I'm seeing this patient by the request  of:  [name], [name], MD  CC: Shoulder pain follow-up  ATF:[id number]   05/23/2020 Patient is having some left shoulder weakness.  He does have known degenerative disc disease at C4-C6 with moderate foraminal stenosis on the left side.  We will get a repeat x-rays with these being nearly 50 years old.  In addition to this though on ultrasound it does appear that patient did have a rotator cuff repair previously and may have a new tear of the supraspinatus.  Patient has already done some outside physical therapy.  This is been going on for multiple months at this time.  Now associated with some weakness and waking him up at night.  Ultrasound did show acromioclavicular arthritis with effusion and does have bursitis in addition to the abnormal findings of the supraspinatus tendon.  Patient will have x-rays today and then will get an MR arthrogram to further evaluate.  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  Depending on imaging we will discuss what treatment is best.  Patient does have what appears to be more of a spondylolisthesis with grade 1 at L5-S1.  He does have moderate to severe facet arthropathy bilaterally at this level as well as moderately at the L4-L5 level.  Patient has seen a surgeon and they are discussing the possibility of an anterior and posterior fusion at this level.  Patient would like second opinion just  to make sure he is doing the right thing we will refer him to discuss with another surgeon.  Update 06/28/2020 Roy Roy [name] is a 50 y.o. male coming in with complaint of left shoulder pain.  Was having significant amount of pain.  Failing conservative therapy.  MRI of the left shoulder was done.  MRI independently visualized by me today.  MRI did have a very small partial tear noted of the distal supraspinatus but does not appear to be full-thickness and Roy [name] significant retraction noted.  Mild acromioclavicular and glenohumeral arthritic changes noted.  Mild bursitis of the shoulder noted.  Patient has not been very active and has not tested out shoulder since last visit.     MRI L shoulder 06/19/2020 IMPRESSION: Rotator cuff tendinopathy appears worst in the supraspinatus. There are small undersurface tear in the distal supraspinatus but Roy [name] full-thickness tear or tendon retraction is identified. Roy [name] atrophy.  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  Intact long head of biceps and glenoid labrum.  Small volume of fluid in the subacromial/subdeltoid bursa consistent with bursitis.  Past Medical History:  Diagnosis Date  . Allergy   . Chronic rhinitis    Deviated nasal septum, hypertrophy of nasal turbinates: Dr. Dustin [name] turb reduct and septoplasty.  . Colitis   . Diverticulosis 2009   Colonoscopy confirmed approx 2009 (diverticulitis x 2 episodes).  . Elevated PSA 08/2016   Urol eval: plan is to repeat PSA's to follow trend: PSA 01/08/17: 3.52. (Roy [name] bx as of 01/2017).  I will repeat PSA at  his CPE 07/2017.  He returns for another PSA w/Dr. Herrick 12/2017--this was 2.49.  Needs annual PSAs with either urologist or me.  [name] GERD (gastroesophageal reflux disease)   . Glaucoma   . History of adenomatous polyp of colon 12/11/2015;11/2018   Recall 11/2023.  [name] Hypercholesterolemia 2018,2019,2020   Mild; Framingham CV risk<<7.5%.  TLC.  07/2019 I  recommended he see advanced lipid clinic provider.  . Irritable bowel syndrome with diarrhea   . Left lumbar radiculopathy 2024   Dr. Tamala [name] and Dr. Othelia Roy [name]  . Obesity, Class I, BMI 30-34.9   . OSA on CPAP    Sleep study in remote past was normal per pt report.  OSA eval w/[place] 2018.  AHI 19/hr, desat to 81%.  Titration study 01/21/2017, optimal CPAP 6 cm H20.   . Rotator cuff impingement syndrome 2018   Bilat (Dr. Tamala [name]).  Ongoing R shoulder pain: labral tear treated non-operatively=failed, so referred to ortho 11/2016.  [name] Sleep apnea   . Thrombocytopenia ([place]) 2014   Mild; stable 2014-2016.  Abd u/s 02/2014 NORMAL.   Past Surgical History:  Procedure Laterality Date  . BICEPS TENDON REPAIR    . COLONOSCOPY W/ POLYPECTOMY  12/11/2015; 12/21/18   2017 Tubular adenoma x 1, +Diverticulosis and int hem.  Rpt 11/2018 with 1 sessile serrated poly, diverticulosis: recall 5 yrs.  [name] EYE SURGERY     lasik  . KNEE ARTHROSCOPY Bilateral    Bilat; 2003, 2004, 2010  . [place] SURGERY  01/2018  . NASAL SEPTOPLASTY W/ TURBINOPLASTY Bilateral 04/14/2015   Procedure: NASAL SEPTOPLASTY WITH BILATERAL INFERIOR  TURBINATE REDUCTION;  Surgeon: Roy Belfast, MD;  Location: [facility];  Service: ENT;  Laterality: Bilateral;  . SHOULDER ARTHROSCOPY Bilateral    Bilat; 1993, 2000, 2007  . WISDOM TOOTH EXTRACTION     young adult  . WRIST FRACTURE SURGERY Left 2010   Hockey injury, plate inserted   Social History   Socioeconomic History  . Marital status: Married    Spouse name: Not on file  . Number of children: 4  . Years of education: Not on file  . Highest education level: Not on file  Occupational History  . Occupation: Chief Financial Officer  Tobacco Use  . Smoking status: Never Smoker  . Smokeless tobacco: Never Used  Vaping Use  . Vaping Use: Never used  Substance and Sexual Activity  . Alcohol use: Yes    Alcohol/week: 1.0 standard drink    Types: 1 Cans of beer per week    Comment: beer  occasional  . Drug use: Roy [name]  . Sexual activity: Not on file  Other Topics Concern  . Not on file  Social History Narrative   Married, 4 children from ages 12 yrs to 27 yrs.  Roy [name] sibs.   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   Grad from [employer] in [place], [place].   Originally from [place], lived in [place] for a while, then relocated to [facility] [place]) about 2010.   Exercises irregularly, Roy [name] particular dietary focus.   Roy [name] T/A/Ds.   Social Determinants of Health   Financial Resource Strain: Not on file  Food Insecurity: Not on file  Transportation Needs: Not on file  Physical Activity: Not on file  Stress: Not on file  Social Connections: Not on file   Allergies  Allergen Reactions  . Dayquil Multi-Symptom Cold-Flu [Pseudoephedrine-Apap-Dm] Hives   Family History  Problem Relation Age of Onset  . Pancreatic cancer Paternal Grandfather   . Lung  cancer Paternal Grandfather         (smoker)  . Colon cancer Maternal Grandmother   . Colon polyps Father   . Diverticulitis Mother   . Esophageal cancer Neg Hx   . Stomach cancer Neg Hx   . Rectal cancer Neg Hx       Current Outpatient Medications (Respiratory):  .  loratadine (CLARITIN) 10 MG tablet, Take 10 mg by mouth daily.  Current Outpatient Medications (Analgesics):  .  indomethacin (INDOCIN) 50 MG capsule, 1 cap po tid prn for gout. Take with food.   Current Outpatient Medications (Other):  [name]  BETIMOL 0.25 % ophthalmic solution, Place 1 drop into both eyes 2 (two) times daily. [name]  latanoprost (XALATAN) 0.005 % ophthalmic solution, Place 1 drop into both eyes at bedtime. [name]  omeprazole (PRILOSEC) 40 MG capsule, TAKE 1 CAPSULE BY MOUTH EVERY DAY   Reviewed prior external information including notes and imaging from  primary care provider As well as notes that were available from care everywhere and other healthcare systems.  Past medical history, social, surgical and family history all  reviewed in electronic medical record.  Roy [name] pertanent information unless stated regarding to the chief complaint.   Review of Systems:  Roy [name] headache, visual changes, nausea, vomiting, diarrhea, constipation, dizziness, abdominal pain, skin rash, fevers, chills, night sweats, weight loss, swollen lymph nodes, body aches, joint swelling, chest pain, shortness of breath, mood changes. POSITIVE muscle aches  Objective  Blood pressure 110/78, pulse 76, height 6' (1.829 m), weight 250 lb (113.4 kg), SpO2 98 %.   General: Roy [name] apparent distress alert and oriented x3 mood and affect normal, dressed appropriately.  HEENT: Pupils equal, extraocular movements intact  Respiratory: Patient's speak in full sentences and does not appear short of breath  Cardiovascular: Roy [name] lower extremity edema, non tender, Roy [name] erythema  Gait normal with good balance and coordination.  MSK: Left shoulder exam shows very mild impingement noted.  Patient does have some mild pain with empty can but Roy [name] significant weakness noted.  Patient has near full range of motion.    Impression and Recommendations:     The above documentation has been reviewed and is accurate and complete Lyndal Pulley, DO

## 2020-06-28 NOTE — Assessment & Plan Note (Signed)
Patient does have a very small undersurface tear of the supraspinatus that is partial-thickness.  Discussed with patient at great length.  Patient will be undergoing back surgery and he would like to avoid any type of aggressive therapy at this point and would like to see if patient would improve with just resting.  Given home exercises at this time.  We did go over patient's MRI in great detail as well as showing patient on a posterior about we will need to talk about him where the tenderness.  We discussed other options such as injections and PRP which patient declined.  Patient also declined formal physical therapy with him likely having to do a lot after his back surgery.  Patient will follow up with me again 2 months after his back surgery and we will see how patient is responding.  Total time with him and reviewing the chart and imaging  33 minutes

## 2020-07-04 ENCOUNTER — Ambulatory Visit: Payer: 59 | Admitting: Family Medicine

## 2020-08-25 HISTORY — PX: OTHER SURGICAL HISTORY: SHX169

## 2020-09-13 ENCOUNTER — Encounter: Payer: Self-pay | Admitting: Family Medicine

## 2020-09-20 ENCOUNTER — Other Ambulatory Visit: Payer: Self-pay | Admitting: Family Medicine

## 2020-10-10 ENCOUNTER — Ambulatory Visit: Payer: 59 | Admitting: Dermatology

## 2020-10-23 ENCOUNTER — Other Ambulatory Visit: Payer: Self-pay | Admitting: Family Medicine

## 2020-10-31 ENCOUNTER — Ambulatory Visit (INDEPENDENT_AMBULATORY_CARE_PROVIDER_SITE_OTHER): Payer: 59 | Admitting: Family Medicine

## 2020-10-31 ENCOUNTER — Other Ambulatory Visit: Payer: Self-pay

## 2020-10-31 ENCOUNTER — Encounter: Payer: Self-pay | Admitting: Family Medicine

## 2020-10-31 VITALS — BP 125/84 | HR 71 | Temp 98.2°F | Ht 73.62 in | Wt 255.4 lb

## 2020-10-31 DIAGNOSIS — Z23 Encounter for immunization: Secondary | ICD-10-CM | POA: Diagnosis not present

## 2020-10-31 DIAGNOSIS — Z125 Encounter for screening for malignant neoplasm of prostate: Secondary | ICD-10-CM | POA: Diagnosis not present

## 2020-10-31 DIAGNOSIS — Z0000 Encounter for general adult medical examination without abnormal findings: Secondary | ICD-10-CM

## 2020-10-31 DIAGNOSIS — E7800 Pure hypercholesterolemia, unspecified: Secondary | ICD-10-CM | POA: Diagnosis not present

## 2020-10-31 DIAGNOSIS — R7301 Impaired fasting glucose: Secondary | ICD-10-CM

## 2020-10-31 LAB — LIPID PANEL
Cholesterol: 235 mg/dL — ABNORMAL HIGH (ref 0–200)
HDL: 33.3 mg/dL — ABNORMAL LOW (ref >39.00)
NonHDL: 201.75
Total CHOL/HDL Ratio: 7
Triglycerides: 342 mg/dL — ABNORMAL HIGH (ref 0.0–149.0)
VLDL: 68.4 mg/dL — ABNORMAL HIGH (ref 0.0–40.0)

## 2020-10-31 LAB — COMPREHENSIVE METABOLIC PANEL
ALT: 13 U/L (ref 0–53)
AST: 15 U/L (ref 0–37)
Albumin: 4 g/dL (ref 3.5–5.2)
Alkaline Phosphatase: 110 U/L (ref 39–117)
BUN: 15 mg/dL (ref 6–23)
CO2: 27 mEq/L (ref 19–32)
Calcium: 9.1 mg/dL (ref 8.4–10.5)
Chloride: 102 mEq/L (ref 96–112)
Creatinine, Ser: 1.12 mg/dL (ref 0.40–1.50)
GFR: 76.96 mL/min (ref >60.00)
Glucose, Bld: 104 mg/dL — ABNORMAL HIGH (ref 70–99)
Potassium: 4.1 mEq/L (ref 3.5–5.1)
Sodium: 137 mEq/L (ref 135–145)
Total Bilirubin: 0.6 mg/dL (ref 0.2–1.2)
Total Protein: 7.2 g/dL (ref 6.0–8.3)

## 2020-10-31 LAB — CBC WITH DIFFERENTIAL/PLATELET
Basophils Absolute: 0 10*3/uL (ref 0.0–0.1)
Basophils Relative: 0.6 % (ref 0.0–3.0)
Eosinophils Absolute: 0.1 10*3/uL (ref 0.0–0.7)
Eosinophils Relative: 3.5 % (ref 0.0–5.0)
HCT: 42 % (ref 39.0–52.0)
Hemoglobin: 14.2 g/dL (ref 13.0–17.0)
Lymphocytes Relative: 32.8 % (ref 12.0–46.0)
Lymphs Abs: 1.3 10*3/uL (ref 0.7–4.0)
MCHC: 33.8 g/dL (ref 30.0–36.0)
MCV: 91.6 fl (ref 78.0–100.0)
Monocytes Absolute: 0.4 10*3/uL (ref 0.1–1.0)
Monocytes Relative: 11 % (ref 3.0–12.0)
Neutro Abs: 2.1 10*3/uL (ref 1.4–7.7)
Neutrophils Relative %: 52.1 % (ref 43.0–77.0)
Platelets: 143 10*3/uL — ABNORMAL LOW (ref 150.0–400.0)
RBC: 4.59 Mil/uL (ref 4.22–5.81)
RDW: 14.2 % (ref 11.5–15.5)
WBC: 4 10*3/uL (ref 4.0–10.5)

## 2020-10-31 LAB — TSH: TSH: 2.26 u[IU]/mL (ref 0.35–5.50)

## 2020-10-31 LAB — LDL CHOLESTEROL, DIRECT: Direct LDL: 133 mg/dL

## 2020-10-31 LAB — HEMOGLOBIN A1C: Hgb A1c MFr Bld: 5.9 % (ref 4.6–6.5)

## 2020-10-31 NOTE — Progress Notes (Signed)
Office Note 10/31/2020  CC:  Chief Complaint  Patient presents with   Annual Exam    CPE   HPI:  Roy [name] is a 50 y.o. White male who is here for annual health maintenance exam. Feeling well, got back surgery a couple months ago, says it helped signif, has neurosurg f/u soon.  Has not been exercising b/c of back pain probs + recent back surgery. Plans to restart exercise soon.  Past Medical History:  Diagnosis Date   Allergy    Chronic rhinitis    Deviated nasal septum, hypertrophy of nasal turbinates: Dr. Dustin Flock turb reduct and septoplasty.   Colitis    Diverticulosis 2009   Colonoscopy confirmed approx 2009 (diverticulitis x 2 episodes).   Elevated PSA 08/2016   Urol eval: plan is to repeat PSA's to follow trend: PSA 01/08/17: 3.52. (no bx as of 01/2017).  I will repeat PSA at his CPE 07/2017.  He returns for another PSA w/Dr. Herrick 12/2017--this was 2.49.  Needs annual PSAs with either urologist or me.   GERD (gastroesophageal reflux disease)    Glaucoma    History of adenomatous polyp of colon 12/11/2015;11/2018   Recall 11/2023.   Hypercholesterolemia 2018,2019,2020   Mild; Framingham CV risk<<7.5%.  TLC.  07/2019 I recommended he see advanced lipid clinic provider.   Irritable bowel syndrome with diarrhea    Left lumbar radiculopathy 2024   Dr. Tamala Julian and Dr. Othelia Pulling   Obesity, Class I, BMI 30-34.9    OSA on CPAP    Sleep study in remote past was normal per pt report.  OSA eval w/[place] 2018.  AHI 19/hr, desat to 81%.  Titration study 01/21/2017, optimal CPAP 6 cm H20.    Rotator cuff impingement syndrome 2018   Bilat (Dr. Tamala Julian).  Ongoing R shoulder pain: labral tear treated non-operatively=failed, so referred to ortho 11/2016.   Sleep apnea    Spondylolisthesis at L5-S1 level    Thrombocytopenia ([place]) 2014   Mild; stable 2014-2016.  Abd u/s 02/2014 NORMAL.    Past Surgical History:  Procedure Laterality Date   ALIF--percutaneous  stereotactic pedicle screw placement w/allograft (Dr. Cherlyn Labella)  08/2020   BICEPS TENDON REPAIR     COLONOSCOPY W/ POLYPECTOMY  12/11/2015; 12/21/18   2017 Tubular adenoma x 1, +Diverticulosis and int hem.  Rpt 11/2018 with 1 sessile serrated poly, diverticulosis: recall 5 yrs.   EYE SURGERY     lasik   KNEE ARTHROSCOPY Bilateral    Bilat; 2003, 2004, 2010   LUMBAR [place] SURGERY  01/2018   NASAL SEPTOPLASTY W/ TURBINOPLASTY Bilateral 04/14/2015   Procedure: NASAL SEPTOPLASTY WITH BILATERAL INFERIOR  TURBINATE REDUCTION;  Surgeon: Jerrell Belfast, MD;  Location[facility];  Service: ENT;  Laterality: Bilateral;   SHOULDER ARTHROSCOPY Bilateral    Bilat; 1993, 2000, 2007   WISDOM TOOTH EXTRACTION     young adult   WRIST FRACTURE SURGERY Left 2010   Hockey injury, plate inserted    Family History  Problem Relation Age of Onset   Pancreatic cancer Paternal Grandfather    Lung cancer Paternal Grandfather         (smoker)   Colon cancer Maternal Grandmother    Colon polyps Father    Diverticulitis Mother    Esophageal cancer Neg Hx    Stomach cancer Neg Hx    Rectal cancer Neg Hx     Social History   Socioeconomic History   Marital status: Married    Spouse name: Not on file  Number of children: 4   Years of education: Not on file   Highest education level: Not on file  Occupational History   Occupation: Chief Financial Officer  Tobacco Use   Smoking status: Never   Smokeless tobacco: Never  Vaping Use   Vaping Use: Never used  Substance and Sexual Activity   Alcohol use: Yes    Alcohol/week: 1.0 standard drink    Types: 1 Cans of beer per week    Comment: beer occasional   Drug use: No   Sexual activity: Not on file  Other Topics Concern   Not on file  Social History Narrative   Married, 4 children from ages 8 yrs to 89 yrs.  No sibs.   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   Grad from [employer] in [place], [place].   Originally from [place], lived in  [place] for a while, then relocated to [facility] [employer] [place]) about 2010.   Exercises irregularly, no particular dietary focus.   No T/A/Ds.   Social Determinants of Health   Financial Resource Strain: Not on file  Food Insecurity: Not on file  Transportation Needs: Not on file  Physical Activity: Not on file  Stress: Not on file  Social Connections: Not on file  Intimate Partner Violence: Not on file    Outpatient Medications Prior to Visit  Medication Sig Dispense Refill   BETIMOL 0.25 % ophthalmic solution Place 1 drop into both eyes 2 (two) times daily.  6   indomethacin (INDOCIN) 50 MG capsule 1 cap po tid prn for gout. Take with food. 10 capsule 3   latanoprost (XALATAN) 0.005 % ophthalmic solution Place 1 drop into both eyes at bedtime.     loratadine (CLARITIN) 10 MG tablet Take 10 mg by mouth daily.     omeprazole (PRILOSEC) 40 MG capsule TAKE 1 CAPSULE BY MOUTH EVERY DAY 90 capsule 1   No facility-administered medications prior to visit.    Allergies  Allergen Reactions   Dayquil Multi-Symptom Cold-Flu [Pseudoephedrine-Apap-Dm] Hives   ROS Review of Systems  Constitutional:  Negative for appetite change, chills, fatigue and fever.  HENT:  Negative for congestion, dental problem, ear pain and sore throat.   Eyes:  Negative for discharge, redness and visual disturbance.  Respiratory:  Negative for cough, chest tightness, shortness of breath and wheezing.   Cardiovascular:  Negative for chest pain, palpitations and leg swelling.  Gastrointestinal:  Negative for abdominal pain, blood in stool, diarrhea, nausea and vomiting.  Genitourinary:  Negative for difficulty urinating, dysuria, flank pain, frequency, hematuria and urgency.  Musculoskeletal:  Negative for arthralgias, back pain, joint swelling, myalgias and neck stiffness.  Skin:  Negative for pallor and rash.  Neurological:  Negative for dizziness, speech difficulty, weakness and headaches.  Hematological:  Negative  for adenopathy. Does not bruise/bleed easily.  Psychiatric/Behavioral:  Negative for confusion and sleep disturbance. The patient is not nervous/anxious.    PE; Vitals with BMI 10/31/2020 06/28/2020 05/23/2020  Height 6' 1.622" '6\' 0"'$  '6\' 0"'$   Weight 255 lbs 6 oz 250 lbs 255 lbs  BMI 33.13 [id number]  Systolic 0000000 [id number]  Diastolic 84 78 88  Pulse 71 76 105   Gen: Alert, well appearing.  Patient is oriented to person, place, time, and situation. AFFECT: pleasant, lucid thought and speech. ENT: Ears: EACs clear, normal epithelium.  TMs with good light reflex and landmarks bilaterally.  Eyes: no injection, icteris, swelling, or exudate.  EOMI, PERRLA. Nose: no drainage or turbinate edema/swelling.  No  injection or focal lesion.  Mouth: lips without lesion/swelling.  Oral mucosa pink and moist.  Dentition intact and without obvious caries or gingival swelling.  Oropharynx without erythema, exudate, or swelling.  Neck: supple/nontender.  No LAD, mass, or TM.  Carotid pulses 2+ bilaterally, without bruits. CV: RRR, no m/r/g.   LUNGS: CTA bilat, nonlabored resps, good aeration in all lung fields. ABD: soft, NT, ND, BS normal.  No hepatospenomegaly or mass.  No bruits. EXT: no clubbing, cyanosis, or edema.  Musculoskeletal: no joint swelling, erythema, warmth, or tenderness.  ROM of all joints intact. Skin - no sores or suspicious lesions or rashes or color changes  Pertinent labs:  Lab Results  Component Value Date   TSH 2.01 08/12/2019   Lab Results  Component Value Date   WBC 3.8 (L) 08/12/2019   HGB 15.2 08/12/2019   HCT 44.5 08/12/2019   MCV 94.3 08/12/2019   PLT 144.0 (L) 08/12/2019   Lab Results  Component Value Date   CREATININE 1.20 08/12/2019   BUN 17 08/12/2019   NA 139 08/12/2019   K 4.6 08/12/2019   CL 102 08/12/2019   CO2 30 08/12/2019   Lab Results  Component Value Date   ALT 11 08/12/2019   AST 16 08/12/2019   ALKPHOS 108 08/12/2019   BILITOT 0.8 08/12/2019    Lab Results  Component Value Date   CHOL 229 (H) 08/12/2019   Lab Results  Component Value Date   HDL 38.80 (L) 08/12/2019   Lab Results  Component Value Date   LDLCALC 170 (H) 08/12/2019   Lab Results  Component Value Date   TRIG 97.0 08/12/2019   Lab Results  Component Value Date   CHOLHDL 6 08/12/2019   Lab Results  Component Value Date   PSA 4.29 (H) 07/31/2016   PSA 3.53 08/04/2015   Lab Results  Component Value Date   HGBA1C 5.7 08/12/2019   ASSESSMENT AND PLAN:   1) HLD: 33yrframingham risk is 5% based on last year's lipid panel.  Pt states he does not want any cholesterol lowering medication b/c he is wary of the potential side effects. FLP and hepatic panel today.  2) Health maintenance exam: Reviewed age and gender appropriate health maintenance issues (prudent diet, regular exercise, health risks of tobacco and excessive alcohol, use of seatbelts, fire alarms in home, use of sunscreen).  Also reviewed age and gender appropriate health screening as well as vaccine recommendations. Vaccines: Flu->given today.  Otherwise all UTD. Labs: fasting HP, Hba1c (IFG). Prostate ca screening: hx of mild PSA elevation.  We've been following this along with urologist.  Pt declined PSA here today and said he'll be following up with urologist in the next few months. Colon ca screening: recall 11/2023.  An After Visit Summary was printed and given to the patient.  FOLLOW UP:  Return in about 1 year (around 10/31/2021) for annual CPE (fasting).  Signed:  PCrissie Sickles MD           10/31/2020

## 2020-10-31 NOTE — Patient Instructions (Signed)
Health Maintenance, Male Adopting a healthy lifestyle and getting preventive care are important in promoting health and wellness. Ask your health care provider about: The right schedule for you to have regular tests and exams. Things you can do on your own to prevent diseases and keep yourself healthy. What should I know about diet, weight, and exercise? Eat a healthy diet  Eat a diet that includes plenty of vegetables, fruits, low-fat dairy products, and lean protein. Do not eat a lot of foods that are high in solid fats, added sugars, or sodium. Maintain a healthy weight Body mass index (BMI) is a measurement that can be used to identify possible weight problems. It estimates body fat based on height and weight. Your health care provider can help determine your BMI and help you achieve or maintain a healthy weight. Get regular exercise Get regular exercise. This is one of the most important things you can do for your health. Most adults should: Exercise for at least 150 minutes each week. The exercise should increase your heart rate and make you sweat (moderate-intensity exercise). Do strengthening exercises at least twice a week. This is in addition to the moderate-intensity exercise. Spend less time sitting. Even light physical activity can be beneficial. Watch cholesterol and blood lipids Have your blood tested for lipids and cholesterol at 50 years of age, then have this test every 5 years. You may need to have your cholesterol levels checked more often if: Your lipid or cholesterol levels are high. You are older than 50 years of age. You are at high risk for heart disease. What should I know about cancer screening? Many types of cancers can be detected early and may often be prevented. Depending on your health history and family history, you may need to have cancer screening at various ages. This may include screening for: Colorectal cancer. Prostate cancer. Skin cancer. Lung  cancer. What should I know about heart disease, diabetes, and high blood pressure? Blood pressure and heart disease High blood pressure causes heart disease and increases the risk of stroke. This is more likely to develop in people who have high blood pressure readings, are of African descent, or are overweight. Talk with your health care provider about your target blood pressure readings. Have your blood pressure checked: Every 3-5 years if you are 18-39 years of age. Every year if you are 40 years old or older. If you are between the ages of 65 and 75 and are a current or former smoker, ask your health care provider if you should have a one-time screening for abdominal aortic aneurysm (AAA). Diabetes Have regular diabetes screenings. This checks your fasting blood sugar level. Have the screening done: Once every three years after age 45 if you are at a normal weight and have a low risk for diabetes. More often and at a younger age if you are overweight or have a high risk for diabetes. What should I know about preventing infection? Hepatitis B If you have a higher risk for hepatitis B, you should be screened for this virus. Talk with your health care provider to find out if you are at risk for hepatitis B infection. Hepatitis C Blood testing is recommended for: Everyone born from 1945 through 1965. Anyone with known risk factors for hepatitis C. Sexually transmitted infections (STIs) You should be screened each year for STIs, including gonorrhea and chlamydia, if: You are sexually active and are younger than 50 years of age. You are older than 50 years   of age and your health care provider tells you that you are at risk for this type of infection. Your sexual activity has changed since you were last screened, and you are at increased risk for chlamydia or gonorrhea. Ask your health care provider if you are at risk. Ask your health care provider about whether you are at high risk for HIV.  Your health care provider may recommend a prescription medicine to help prevent HIV infection. If you choose to take medicine to prevent HIV, you should first get tested for HIV. You should then be tested every 3 months for as long as you are taking the medicine. Follow these instructions at home: Lifestyle Do not use any products that contain nicotine or tobacco, such as cigarettes, e-cigarettes, and chewing tobacco. If you need help quitting, ask your health care provider. Do not use street drugs. Do not share needles. Ask your health care provider for help if you need support or information about quitting drugs. Alcohol use Do not drink alcohol if your health care provider tells you not to drink. If you drink alcohol: Limit how much you have to 0-2 drinks a day. Be aware of how much alcohol is in your drink. In the [place] one drink equals one 12 oz bottle of beer (355 mL), one 5 oz glass of wine (148 mL), or one 1 oz glass of hard liquor (44 mL). General instructions Schedule regular health, dental, and eye exams. Stay current with your vaccines. Tell your health care provider if: You often feel depressed. You have ever been abused or do not feel safe at home. Summary Adopting a healthy lifestyle and getting preventive care are important in promoting health and wellness. Follow your health care provider's instructions about healthy diet, exercising, and getting tested or screened for diseases. Follow your health care provider's instructions on monitoring your cholesterol and blood pressure. This information is not intended to replace advice given to you by your health care provider. Make sure you discuss any questions you have with your health care provider. Document Revised: 04/21/2020 Document Reviewed: 02/04/2018 Elsevier Patient Education  2022 Elsevier Inc.  

## 2020-10-31 NOTE — Addendum Note (Signed)
Addended by: Marchia Bond on: 10/31/2020 09:01 AM   Modules accepted: Orders

## 2021-05-03 ENCOUNTER — Encounter: Payer: Self-pay | Admitting: Family Medicine

## 2021-05-03 ENCOUNTER — Other Ambulatory Visit: Payer: Self-pay

## 2021-05-03 MED ORDER — OMEPRAZOLE 40 MG PO CPDR: 40.0000 mg | DELAYED_RELEASE_CAPSULE | Freq: Every day | ORAL | 1 refills | Status: DC

## 2021-06-13 IMAGING — MR MR SHOULDER*L* W/ CM
4 of 6 series · 19 of 40 positions shown · IV contrast (agent unspecified)
Comparison: Plain films left shoulder 05/23/2020.

CLINICAL DATA: Chronic left shoulder pain. Remote history of left
shoulder surgery.

EXAM:
MR ARTHROGRAM OF THE LEFT SHOULDER
TECHNIQUE: Multiplanar, multisequence MR imaging of the left shoulder was
performed following the administration of intra-articular contrast.
CONTRAST:  See Injection Documentation.

[Series 7: T1 fat-sat · axial · left · 3.0mm · 0.56mm/px · z∈[-20,+77]mm · 3 of 34 slices shown (1 of 2)]
[im 7/34]
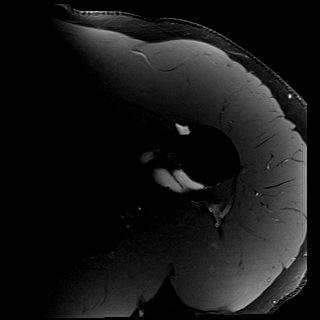
[im 20/34]
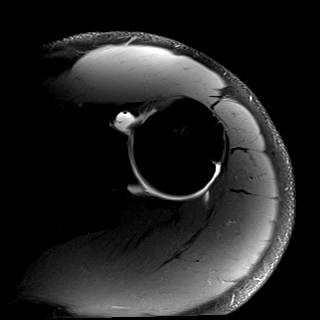
[im 34/34]
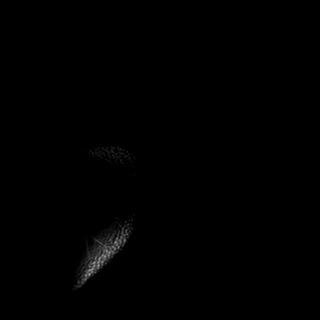

[Series 8: T2 fat-sat · sagittal · left · 3.0mm · 0.35mm/px · 7 of 36 slices shown (1 of 2)]
[im 1/36]
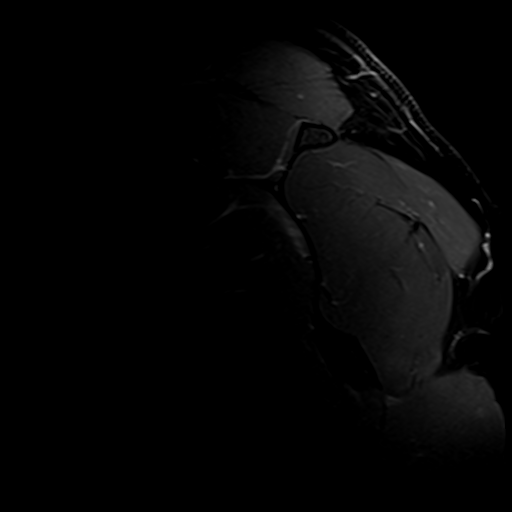
[im 6/36]
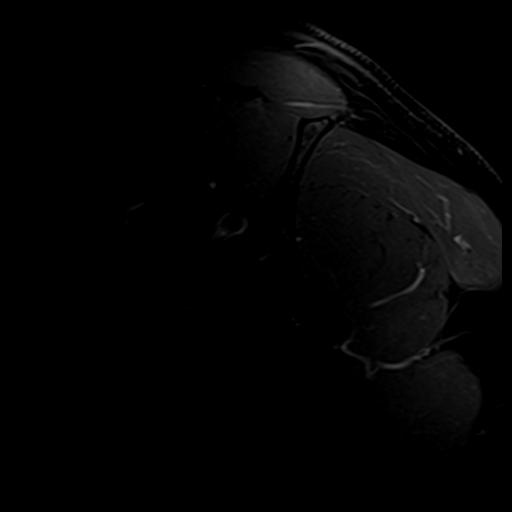
[im 12/36]
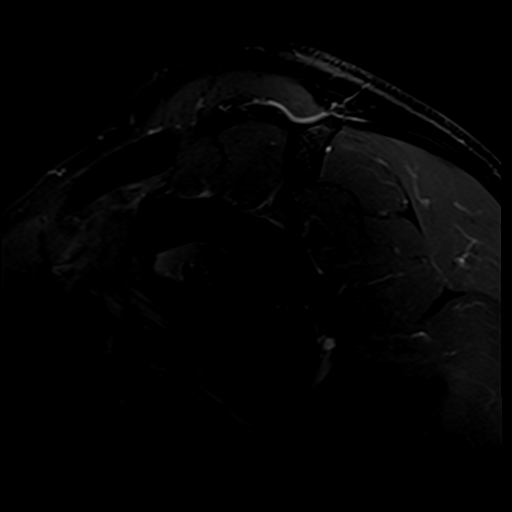
[im 18/36]
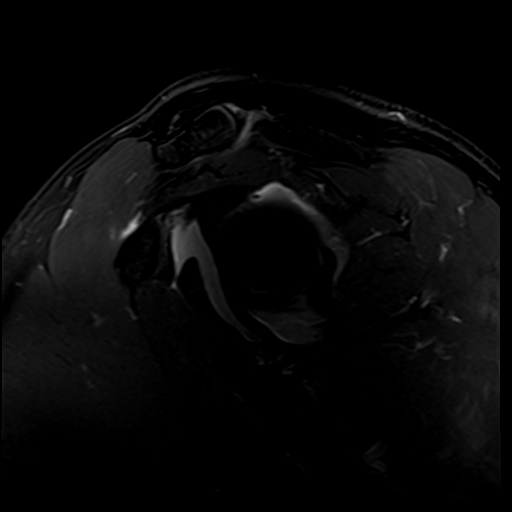
[im 24/36]
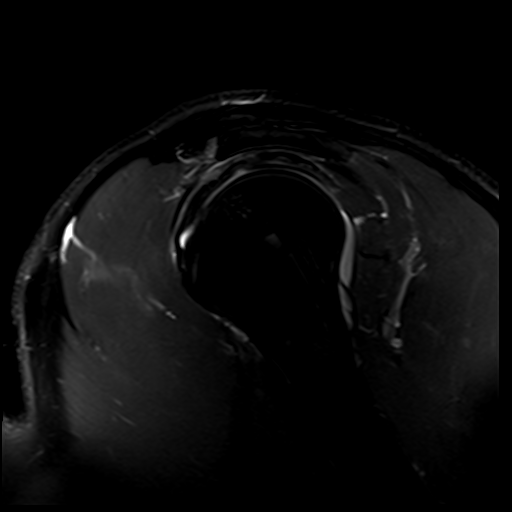
[im 30/36]
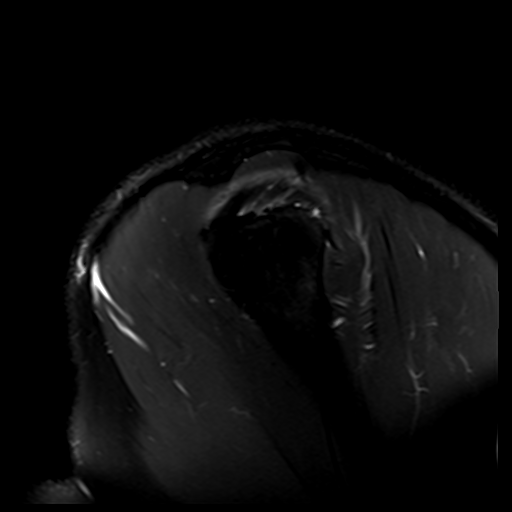
[im 36/36]
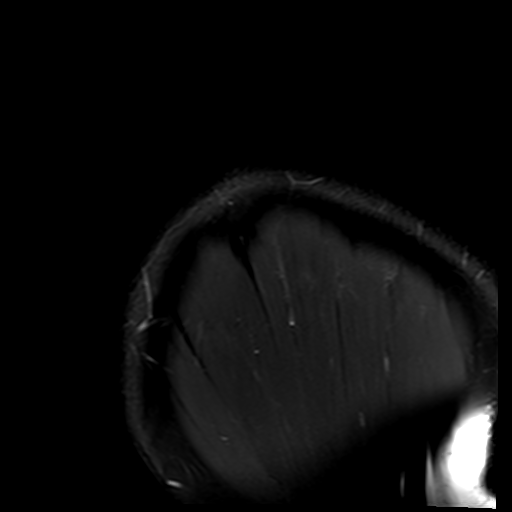

[Series 9: T1 fat-sat · oblique · left · 3.0mm · 0.25mm/px · 3 of 38 slices shown (2 of 2)]
[im 7/38]
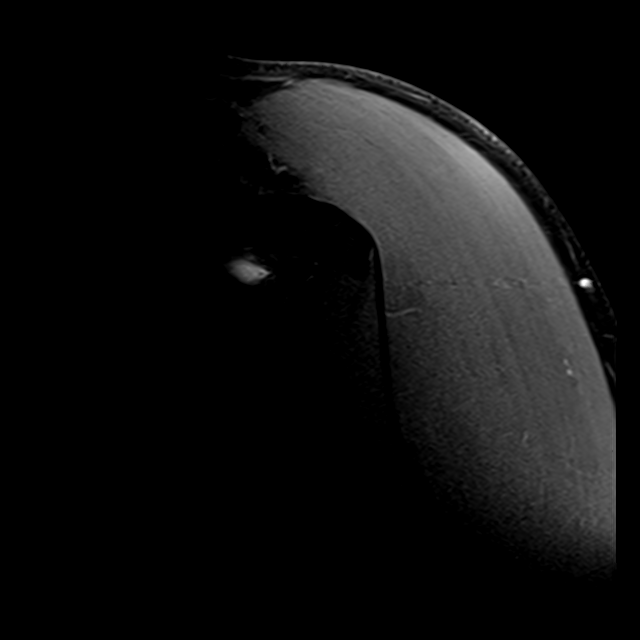
[im 19/38]
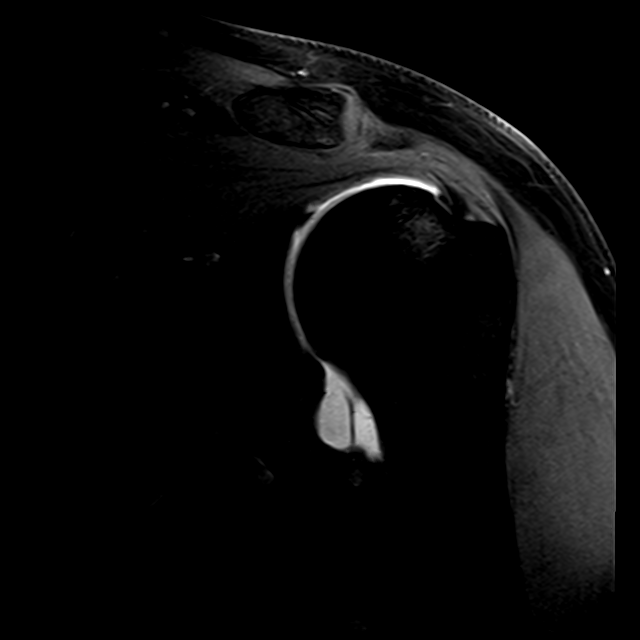
[im 31/38]
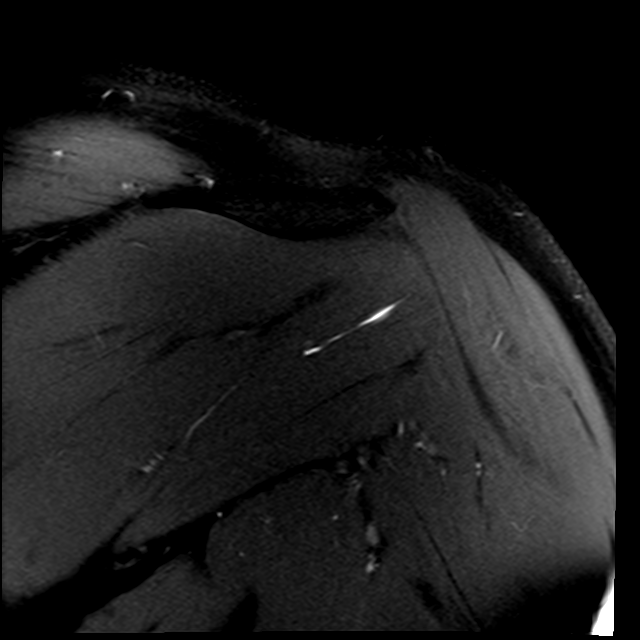

[Series 11: T2 fat-sat · oblique · left · 3.0mm · 0.31mm/px · 6 of 38 slices shown (2 of 2)]
[im 1/38]
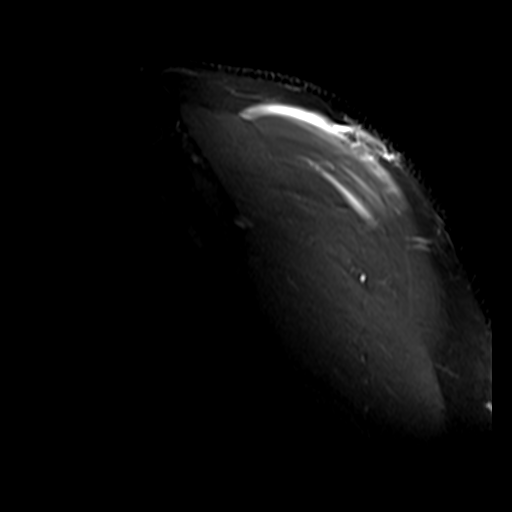
[im 7/38]
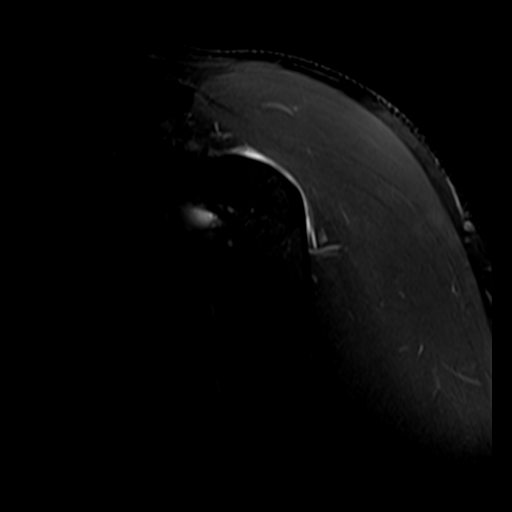
[im 13/38]
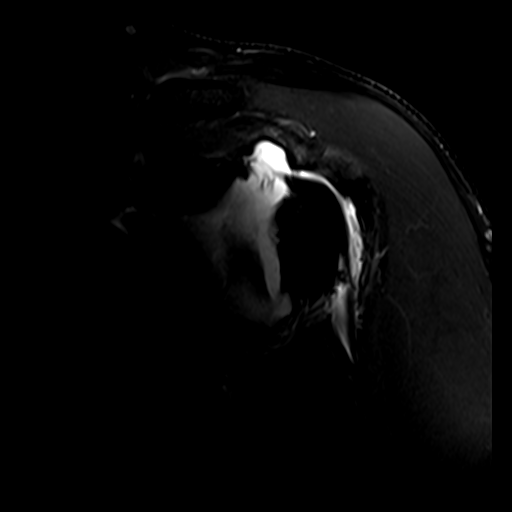
[im 19/38]
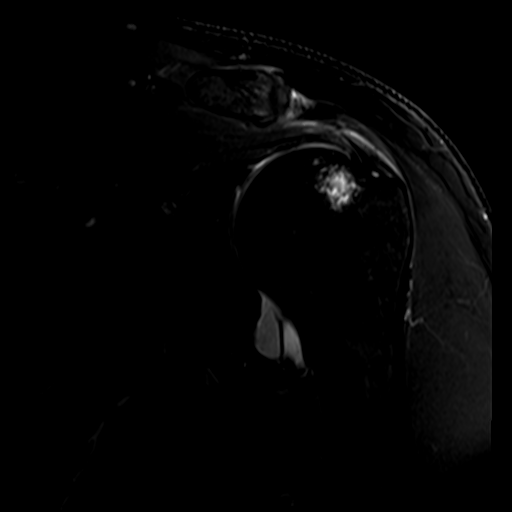
[im 25/38]
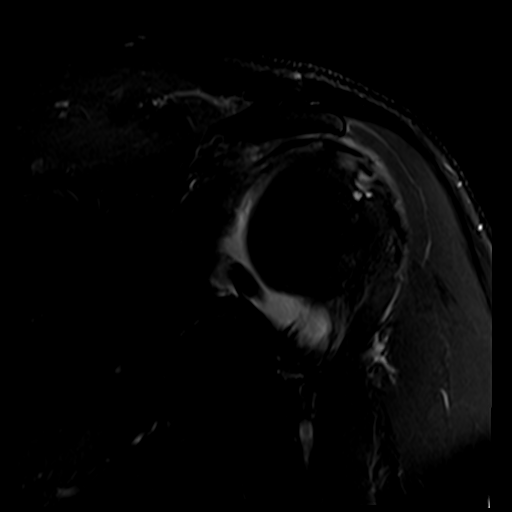
[im 31/38]
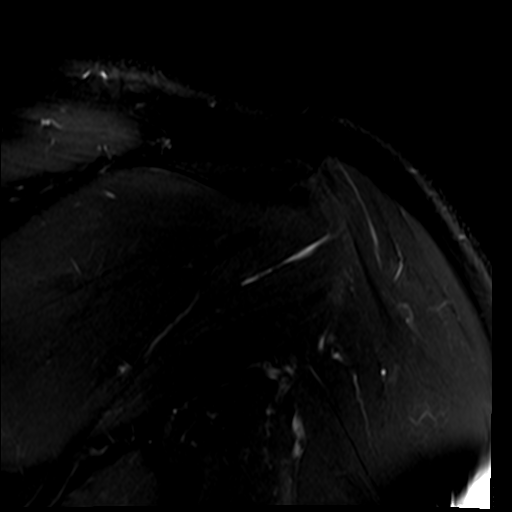

[19 of 40 positions shown; findings below may reference images not displayed]

FINDINGS: Rotator cuff: There is thickening and heterogeneously increased T2
signal in the rotator cuff tendons consistent with tendinopathy.
Small undersurface tears are seen in the anterior and far lateral
supraspinatus. There is no full-thickness tendon tear or tendon
retraction.

Muscles: Normal.  No atrophy or focal lesion.

Biceps long head: Intact and normal in appearance.

Acromioclavicular Joint: Mild osteoarthritis is present. The
acromion is type 2. No contrast extravasates into the
subacromial/subdeltoid bursa but there is a small volume of fluid
within the bursa.

Glenohumeral Joint: The joint is distended with contrast. Cartilage
is mildly thinned without focal defect. No reactive marrow signal
change about the joint.

Labrum: Intact.

Bones: No fracture, contusion or worrisome lesion. Reactive marrow
signal change in the humeral head from rotator cuff tendinosis is
seen. Mild osteophytosis is seen along the posterior, inferior rim
of the glenoid.
IMPRESSION: Rotator cuff tendinopathy appears worst in the supraspinatus. There
are small undersurface tear in the distal supraspinatus but no
full-thickness tear or tendon retraction is identified. No atrophy.

Mild acromioclavicular and very mild glenohumeral osteoarthritis.

Intact long head of biceps and glenoid labrum.

Small volume of fluid in the subacromial/subdeltoid bursa consistent
with bursitis.

## 2021-06-13 IMAGING — XA DG FLUORO GUIDE NDL PLC/BX
1 series · 1 of 1 positions shown · non-contrast
Comparison: none

CLINICAL DATA: Pain and limited range of motion.

[Series 1: ortho adipose · 1 of 1 slices shown]
[im 1/1]
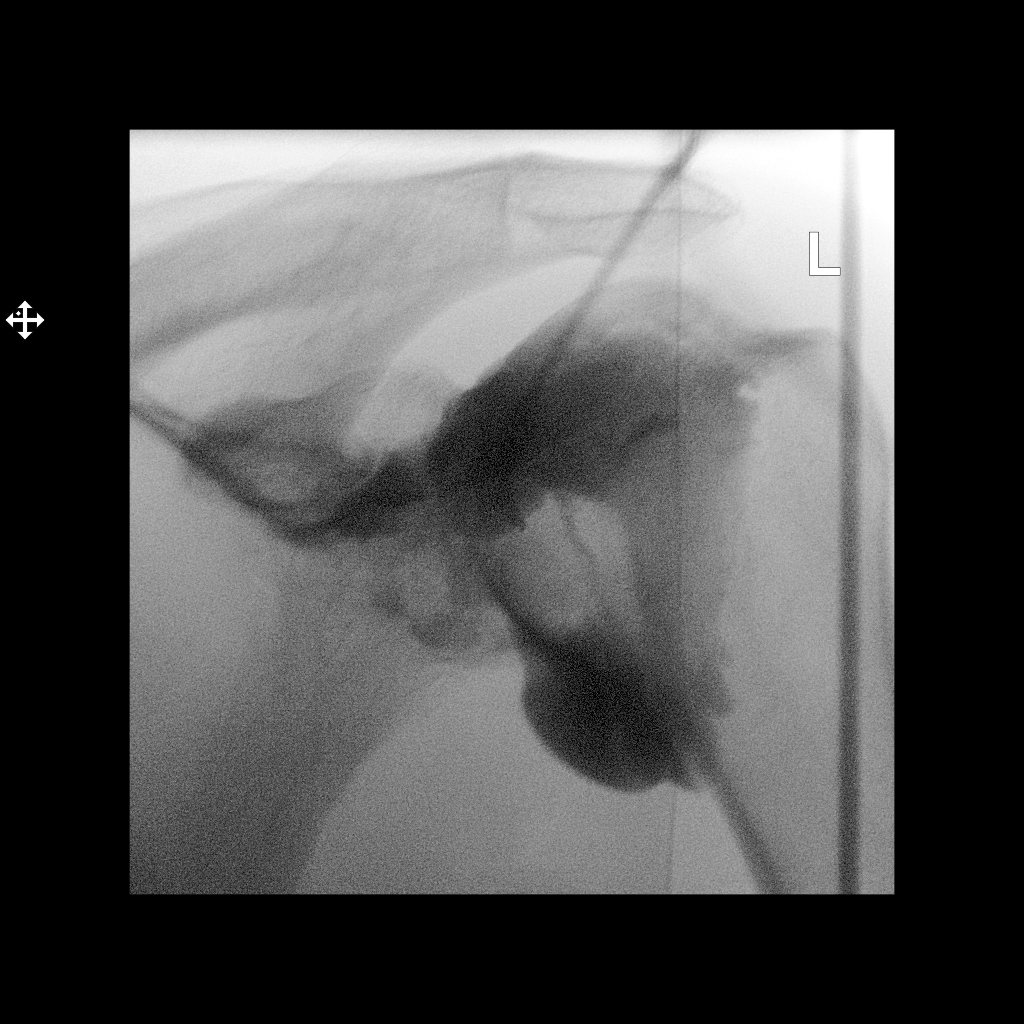

[1 of 1 positions shown; findings below may reference images not displayed]

FLUOROSCOPY TIME:  0 minutes 18 seconds. 1.30 micro gray meter
squared

PROCEDURE:
Left SHOULDER INJECTION UNDER FLUOROSCOPY

The skin overlying the shoulder was scrubbed with Betadine and
draped in sterile fashion. Skin and subcutaneous anesthesia was
carried out using a 25 gauge needle and 1% lidocaine. A 22 gauge
spinal needle was directed under fluoroscopic guidance on one pass
into the glenohumeral joint. 20 cc of a mixture of 0.1 cc MultiHance
and dilute Isovue 200 was then used to fill the glenohumeral joint.
IMPRESSION: Technically successful left shoulder injection for MRI.

## 2021-10-22 NOTE — Progress Notes (Unsigned)
[place] Phone: [phone] Subjective:   Roy [name], am serving as a scribe for Dr. Hulan Saas.  I'm seeing this patient by the request  of:  [name], [name], MD  CC: Left ankle and foot pain  MPN:[id number]  Roy [name] is a 51 y.o. male coming in with complaint of L ankle pain. Last seen in May 2022 for L shoulder pain. Patient states that he has had pain over the bottom of heel and insertion of achilles for past month. Pain waxes and wanes throughout the day. Patient his shin in May and had some discoloration that was in ankle. Patient has tried to ice foot with little to [name] relief. Is planning a hiking trip in April to [facility].        Past Medical History:  Diagnosis Date   Allergy    Chronic rhinitis    Deviated nasal septum, hypertrophy of nasal turbinates: Dr. Dustin Flock turb reduct and septoplasty.   Colitis    Diverticulosis 2009   Colonoscopy confirmed approx 2009 (diverticulitis x 2 episodes).   Elevated PSA 08/2016   Urol eval: plan is to repeat PSA's to follow trend: PSA 01/08/17: 3.52. ([name] bx as of 01/2017).  I will repeat PSA at his CPE 07/2017.  He returns for another PSA w/Dr. Herrick 12/2017--this was 2.49.  Needs annual PSAs with either urologist or me.   GERD (gastroesophageal reflux disease)    Glaucoma    History of adenomatous polyp of colon 12/11/2015;11/2018   Recall 11/2023.   Hypercholesterolemia 2018,2019,2020   Mild; Framingham CV risk<<7.5%.  TLC.  07/2019 I recommended he see advanced lipid clinic provider.   Irritable bowel syndrome with diarrhea    Left lumbar radiculopathy 2024   Dr. Tamala Julian and Dr. Othelia Pulling   Obesity, Class I, BMI 30-34.9    OSA on CPAP    Sleep study in remote past was normal per pt report.  OSA eval w/[facility] 2018.  AHI 19/hr, desat to 81%.  Titration study 01/21/2017, optimal CPAP 6 cm H20.    Rotator cuff impingement  syndrome 2018   Bilat (Dr. Tamala Julian).  Ongoing R shoulder pain: labral tear treated non-operatively=failed, so referred to ortho 11/2016.   Sleep apnea    Spondylolisthesis at L5-S1 level    Thrombocytopenia ([place]) 2014   Mild; stable 2014-2016.  Abd u/s 02/2014 NORMAL.   Past Surgical History:  Procedure Laterality Date   ALIF--percutaneous stereotactic pedicle screw placement w/allograft (Dr. Cherlyn Labella)  08/2020   BICEPS TENDON REPAIR     COLONOSCOPY W/ POLYPECTOMY  12/11/2015; 12/21/18   2017 Tubular adenoma x 1, +Diverticulosis and int hem.  Rpt 11/2018 with 1 sessile serrated poly, diverticulosis: recall 5 yrs.   EYE SURGERY     lasik   KNEE ARTHROSCOPY Bilateral    [facility]ilat; 2003, 2004, 2010   LUMBAR [place] SURGERY  01/2018   NASAL SEPTOPLASTY W/ TURBINOPLASTY Bilateral 04/14/2015   Procedure: NASAL SEPTOPLASTY WITH BILATERAL INFERIOR  TURBINATE REDUCTION;  Surgeon: Jerrell Belfast, MD;  Location: [facility];  Service: ENT;  Laterality: Bilateral;   SHOULDER ARTHROSCOPY Bilateral    Bilat; 1993, 2000, 2007   WISDOM TOOTH EXTRACTION     young adult   WRIST FRACTURE SURGERY Left 2010   Hockey injury, plate inserted   Social History   Socioeconomic History   Marital status: Married    Spouse name: Not on file   Number of children:  4   Years of education: Not on file   Highest education level: Not on file  Occupational History   Occupation: Chief Financial Officer  Tobacco Use   Smoking status: Never   Smokeless tobacco: Never  Vaping Use   Vaping Use: Never used  Substance and Sexual Activity   Alcohol use: Yes    Alcohol/week: 1.0 standard drink of alcohol    Types: 1 Cans of beer per week    Comment: beer occasional   Drug use: [name]   Sexual activity: Not on file  Other Topics Concern   Not on file  Social History Narrative   Married, 4 children from ages 57 yrs to 61 yrs.  [name] sibs.   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   Grad from [employer] in [place], [place].    Originally from [place], lived in [place] for a while, then relocated to [facility] [place] about 2010.   Exercises irregularly, [name] particular dietary focus.   [name] T/A/Ds.   Social Determinants of Health   Financial Resource Strain: Not on file  Food Insecurity: Not on file  Transportation Needs: Not on file  Physical Activity: Not on file  Stress: Not on file  Social Connections: Not on file   Allergies  Allergen Reactions   Dayquil Multi-Symptom Cold-Flu [Pseudoephedrine-Apap-Dm] Hives   Family History  Problem Relation Age of Onset   Pancreatic cancer Paternal Grandfather    Lung cancer Paternal Grandfather         (smoker)   Colon cancer Maternal Grandmother    Colon polyps Father    Diverticulitis Mother    Esophageal cancer Neg Hx    Stomach cancer Neg Hx    Rectal cancer Neg Hx       Current Outpatient Medications (Respiratory):    loratadine (CLARITIN) 10 MG tablet, Take 10 mg by mouth daily.  Current Outpatient Medications (Analgesics):    indomethacin (INDOCIN) 50 MG capsule, 1 cap po tid prn for gout. Take with food.   meloxicam (MOBIC) 15 MG tablet, Take 1 tablet (15 mg total) by mouth daily.   Current Outpatient Medications (Other):    BETIMOL 0.25 % ophthalmic solution, Place 1 drop into both eyes 2 (two) times daily.   latanoprost (XALATAN) 0.005 % ophthalmic solution, Place 1 drop into both eyes at bedtime.   omeprazole (PRILOSEC) 40 MG capsule, Take 1 capsule (40 mg total) by mouth daily. TAKE 1 CAPSULE BY MOUTH EVERY DAY   Reviewed prior external information including notes and imaging from  primary care provider As well as notes that were available from care everywhere and other healthcare systems.  Past medical history, social, surgical and family history all reviewed in electronic medical record.  [name] pertanent information unless stated regarding to the chief complaint.   Review of Systems:  [name] headache, visual changes, nausea, vomiting,  diarrhea, constipation, dizziness, abdominal pain, skin rash, fevers, chills, night sweats, weight loss, swollen lymph nodes, body aches, joint swelling, chest pain, shortness of breath, mood changes. POSITIVE muscle aches  Objective  Blood pressure 102/80, pulse 74, height '6\' 1"'$  (1.854 m), weight 262 lb (118.8 kg), SpO2 99 %.   General: [name] apparent distress alert and oriented x3 mood and affect normal, dressed appropriately.  HEENT: Pupils equal, extraocular movements intact  Respiratory: Patient's speak in full sentences and does not appear short of breath  Cardiovascular: [name] lower extremity edema, non tender, [name] erythema  Patient's left foot does have some very mild breakdown of the longitudinal  arch.  Mild breakdown of the transverse arch noted as well.  [name] pain in the calf area.  [name] pain over the Achilles.  Very mild pain more over the medial calcaneal area.  Limited muscular skeletal ultrasound was performed and interpreted by Hulan Saas, M  Limited ultrasound of patient's foot shows some mild hypoechoic changes of the posterior tibialis tendon.  Patient does have some thickening of the plantar fascia noted.  Some mild hypoechoic changes of this as well as the overlying fat pad. Impression: Plantar fasciitis  [id number]; 15 additional minutes spent for Therapeutic exercises as stated in above notes.  This included exercises focusing on stretching, strengthening, with significant focus on eccentric aspects.   Long term goals include an improvement in range of motion, strength, endurance as well as avoiding reinjury. Patient's frequency would include in 1-2 times a day, 3-5 times a week for a duration of 6-12 weeks.  Exercises for the foot include:  Stretches to help lengthen the lower leg and plantar fascia areas Theraband exercises for the lower leg and ankle to help strengthen the surrounding area- dorsiflexion, plantarflexion, inversion, eversion Massage rolling on the plantar surface of the  foot with a frozen bottle, tennis ball or golf ball Towel or marble pick-ups to strengthen the plantar surface of the foot Weight bearing exercises to increase balance and overall stability  Proper technique shown and discussed handout in great detail with ATC.  All questions were discussed and answered.      Impression and Recommendations:    The above documentation has been reviewed and is accurate and complete Lyndal Pulley, DO

## 2021-10-23 ENCOUNTER — Ambulatory Visit: Payer: Self-pay

## 2021-10-23 ENCOUNTER — Encounter: Payer: Self-pay | Admitting: Family Medicine

## 2021-10-23 ENCOUNTER — Ambulatory Visit (INDEPENDENT_AMBULATORY_CARE_PROVIDER_SITE_OTHER): Payer: 59 | Admitting: Family Medicine

## 2021-10-23 VITALS — BP 102/80 | HR 74 | Ht 73.0 in | Wt 262.0 lb

## 2021-10-23 DIAGNOSIS — M722 Plantar fascial fibromatosis: Secondary | ICD-10-CM

## 2021-10-23 DIAGNOSIS — M79672 Pain in left foot: Secondary | ICD-10-CM | POA: Diagnosis not present

## 2021-10-23 MED ORDER — MELOXICAM 15 MG PO TABS: 15.0000 mg | ORAL_TABLET | Freq: Every day | ORAL | 0 refills | Status: DC

## 2021-10-23 NOTE — Patient Instructions (Addendum)
Exercises Ice 10 min using frozen water bottle Recovery sandals: HOKA or OOFOS Spenco Total Support Original  KEEN, [place], Ultra shoes Avoid high impact exercise and being barefoot See me in 7-8 weeks

## 2021-10-23 NOTE — Assessment & Plan Note (Signed)
Patient has unfortunately had to gain weight secondary to his back surgery as well as started having some shoulder pain so was unable to do as much lifting.  I do believe that this is contributing to some of the discomfort and pain.  Given meloxicam, home exercises, discussed better shoes and avoiding being barefoot.  Follow-up again in 6 to 8 weeks.  Patient does have the goal in April of climbing at first.

## 2021-11-02 ENCOUNTER — Telehealth: Payer: Self-pay | Admitting: Family Medicine

## 2021-11-02 NOTE — Telephone Encounter (Signed)
Pt's wife called for husband due to his work schedule to request additional re-fills for Omeprazole.

## 2021-11-05 ENCOUNTER — Other Ambulatory Visit: Payer: Self-pay

## 2021-11-05 MED ORDER — OMEPRAZOLE 40 MG PO CPDR: 40.0000 mg | DELAYED_RELEASE_CAPSULE | Freq: Every day | ORAL | 0 refills | Status: DC

## 2021-11-05 NOTE — Telephone Encounter (Signed)
Spoke with pt's wife, Maudie Mercury and advised refill. Please remind pt of scheduled appt 9/15

## 2021-11-06 ENCOUNTER — Ambulatory Visit: Payer: 59 | Admitting: Family Medicine

## 2021-11-09 ENCOUNTER — Encounter: Payer: Self-pay | Admitting: Family Medicine

## 2021-11-09 ENCOUNTER — Ambulatory Visit (INDEPENDENT_AMBULATORY_CARE_PROVIDER_SITE_OTHER): Payer: 59 | Admitting: Family Medicine

## 2021-11-09 VITALS — BP 120/80 | HR 68 | Temp 98.2°F | Ht 73.0 in | Wt 264.2 lb

## 2021-11-09 DIAGNOSIS — R7303 Prediabetes: Secondary | ICD-10-CM | POA: Diagnosis not present

## 2021-11-09 DIAGNOSIS — E538 Deficiency of other specified B group vitamins: Secondary | ICD-10-CM

## 2021-11-09 DIAGNOSIS — K219 Gastro-esophageal reflux disease without esophagitis: Secondary | ICD-10-CM | POA: Diagnosis not present

## 2021-11-09 DIAGNOSIS — Z0000 Encounter for general adult medical examination without abnormal findings: Secondary | ICD-10-CM | POA: Diagnosis not present

## 2021-11-09 DIAGNOSIS — Z23 Encounter for immunization: Secondary | ICD-10-CM

## 2021-11-09 DIAGNOSIS — Z125 Encounter for screening for malignant neoplasm of prostate: Secondary | ICD-10-CM | POA: Diagnosis not present

## 2021-11-09 LAB — LIPID PANEL
Cholesterol: 234 mg/dL — ABNORMAL HIGH (ref 0–200)
HDL: 34.2 mg/dL — ABNORMAL LOW (ref >39.00)
LDL Cholesterol: 162 mg/dL — ABNORMAL HIGH (ref 0–99)
NonHDL: 199.86
Total CHOL/HDL Ratio: 7
Triglycerides: 189 mg/dL — ABNORMAL HIGH (ref 0.0–149.0)
VLDL: 37.8 mg/dL (ref 0.0–40.0)

## 2021-11-09 LAB — COMPREHENSIVE METABOLIC PANEL
ALT: 14 U/L (ref 0–53)
AST: 17 U/L (ref 0–37)
Albumin: 4.1 g/dL (ref 3.5–5.2)
Alkaline Phosphatase: 95 U/L (ref 39–117)
BUN: 18 mg/dL (ref 6–23)
CO2: 27 mEq/L (ref 19–32)
Calcium: 9.3 mg/dL (ref 8.4–10.5)
Chloride: 105 mEq/L (ref 96–112)
Creatinine, Ser: 1.17 mg/dL (ref 0.40–1.50)
GFR: 72.51 mL/min (ref >60.00)
Glucose, Bld: 104 mg/dL — ABNORMAL HIGH (ref 70–99)
Potassium: 4.4 mEq/L (ref 3.5–5.1)
Sodium: 139 mEq/L (ref 135–145)
Total Bilirubin: 0.9 mg/dL (ref 0.2–1.2)
Total Protein: 7.4 g/dL (ref 6.0–8.3)

## 2021-11-09 LAB — TSH: TSH: 2.08 u[IU]/mL (ref 0.35–5.50)

## 2021-11-09 LAB — CBC
HCT: 43.6 % (ref 39.0–52.0)
Hemoglobin: 15.1 g/dL (ref 13.0–17.0)
MCHC: 34.7 g/dL (ref 30.0–36.0)
MCV: 93.3 fl (ref 78.0–100.0)
Platelets: 139 10*3/uL — ABNORMAL LOW (ref 150.0–400.0)
RBC: 4.68 Mil/uL (ref 4.22–5.81)
RDW: 13.5 % (ref 11.5–15.5)
WBC: 4.2 10*3/uL (ref 4.0–10.5)

## 2021-11-09 LAB — PSA: PSA: 6.32 ng/mL — ABNORMAL HIGH (ref 0.10–4.00)

## 2021-11-09 LAB — HEMOGLOBIN A1C: Hgb A1c MFr Bld: 5.8 % (ref 4.6–6.5)

## 2021-11-09 LAB — VITAMIN B12: Vitamin B-12: 159 pg/mL — ABNORMAL LOW (ref 211–911)

## 2021-11-09 MED ORDER — OMEPRAZOLE 40 MG PO CPDR: 40.0000 mg | DELAYED_RELEASE_CAPSULE | Freq: Every day | ORAL | 3 refills | Status: DC

## 2021-11-09 NOTE — Patient Instructions (Signed)
Health Maintenance, Male Adopting a healthy lifestyle and getting preventive care are important in promoting health and wellness. Ask your health care provider about: The right schedule for you to have regular tests and exams. Things you can do on your own to prevent diseases and keep yourself healthy. What should I know about diet, weight, and exercise? Eat a healthy diet  Eat a diet that includes plenty of vegetables, fruits, low-fat dairy products, and lean protein. Do not eat a lot of foods that are high in solid fats, added sugars, or sodium. Maintain a healthy weight Body mass index (BMI) is a measurement that can be used to identify possible weight problems. It estimates body fat based on height and weight. Your health care provider can help determine your BMI and help you achieve or maintain a healthy weight. Get regular exercise Get regular exercise. This is one of the most important things you can do for your health. Most adults should: Exercise for at least 150 minutes each week. The exercise should increase your heart rate and make you sweat (moderate-intensity exercise). Do strengthening exercises at least twice a week. This is in addition to the moderate-intensity exercise. Spend less time sitting. Even light physical activity can be beneficial. Watch cholesterol and blood lipids Have your blood tested for lipids and cholesterol at 51 years of age, then have this test every 5 years. You may need to have your cholesterol levels checked more often if: Your lipid or cholesterol levels are high. You are older than 51 years of age. You are at high risk for heart disease. What should I know about cancer screening? Many types of cancers can be detected early and may often be prevented. Depending on your health history and family history, you may need to have cancer screening at various ages. This may include screening for: Colorectal cancer. Prostate cancer. Skin cancer. Lung  cancer. What should I know about heart disease, diabetes, and high blood pressure? Blood pressure and heart disease High blood pressure causes heart disease and increases the risk of stroke. This is more likely to develop in people who have high blood pressure readings or are overweight. Talk with your health care provider about your target blood pressure readings. Have your blood pressure checked: Every 3-5 years if you are 18-39 years of age. Every year if you are 40 years old or older. If you are between the ages of 65 and 75 and are a current or former smoker, ask your health care provider if you should have a one-time screening for abdominal aortic aneurysm (AAA). Diabetes Have regular diabetes screenings. This checks your fasting blood sugar level. Have the screening done: Once every three years after age 45 if you are at a normal weight and have a low risk for diabetes. More often and at a younger age if you are overweight or have a high risk for diabetes. What should I know about preventing infection? Hepatitis B If you have a higher risk for hepatitis B, you should be screened for this virus. Talk with your health care provider to find out if you are at risk for hepatitis B infection. Hepatitis C Blood testing is recommended for: Everyone born from 1945 through 1965. Anyone with known risk factors for hepatitis C. Sexually transmitted infections (STIs) You should be screened each year for STIs, including gonorrhea and chlamydia, if: You are sexually active and are younger than 51 years of age. You are older than 51 years of age and your   health care provider tells you that you are at risk for this type of infection. Your sexual activity has changed since you were last screened, and you are at increased risk for chlamydia or gonorrhea. Ask your health care provider if you are at risk. Ask your health care provider about whether you are at high risk for HIV. Your health care provider  may recommend a prescription medicine to help prevent HIV infection. If you choose to take medicine to prevent HIV, you should first get tested for HIV. You should then be tested every 3 months for as long as you are taking the medicine. Follow these instructions at home: Alcohol use Do not drink alcohol if your health care provider tells you not to drink. If you drink alcohol: Limit how much you have to 0-2 drinks a day. Know how much alcohol is in your drink. In the [place] one drink equals one 12 oz bottle of beer (355 mL), one 5 oz glass of wine (148 mL), or one 1 oz glass of hard liquor (44 mL). Lifestyle Do not use any products that contain nicotine or tobacco. These products include cigarettes, chewing tobacco, and vaping devices, such as e-cigarettes. If you need help quitting, ask your health care provider. Do not use street drugs. Do not share needles. Ask your health care provider for help if you need support or information about quitting drugs. General instructions Schedule regular health, dental, and eye exams. Stay current with your vaccines. Tell your health care provider if: You often feel depressed. You have ever been abused or do not feel safe at home. Summary Adopting a healthy lifestyle and getting preventive care are important in promoting health and wellness. Follow your health care provider's instructions about healthy diet, exercising, and getting tested or screened for diseases. Follow your health care provider's instructions on monitoring your cholesterol and blood pressure. This information is not intended to replace advice given to you by your health care provider. Make sure you discuss any questions you have with your health care provider. Document Revised: 07/03/2020 Document Reviewed: 07/03/2020 Elsevier Patient Education  2023 Elsevier Inc.  

## 2021-11-09 NOTE — Progress Notes (Signed)
Office Note 11/09/2021  CC:  Chief Complaint  Patient presents with   Annual Exam    HPI:  Patient is a 51 y.o. male who is here for annual health maintenance exam and f/u GERD. Roy [name] is feeling well, recovering from low back surgery.  He still takes his omeprazole daily.  If he misses a dose he is pretty miserable.   Past Medical History:  Diagnosis Date   Allergy    Chronic rhinitis    Deviated nasal septum, hypertrophy of nasal turbinates: Dr. Dustin Flock turb reduct and septoplasty.   Colitis    Diverticulosis 2009   Colonoscopy confirmed approx 2009 (diverticulitis x 2 episodes).   Elevated PSA 08/2016   Urol eval: plan is to repeat PSA's to follow trend: PSA 01/08/17: 3.52. (no bx as of 01/2017).  I will repeat PSA at his CPE 07/2017.  He returns for another PSA w/Dr. Herrick 12/2017--this was 2.49.  Needs annual PSAs with either urologist or me.   GERD (gastroesophageal reflux disease)    Glaucoma    History of adenomatous polyp of colon 12/11/2015;11/2018   Recall 11/2023.   Hypercholesterolemia 2018,2019,2020   Mild; Framingham CV risk<<7.5%.  TLC.  07/2019 I recommended he see advanced lipid clinic provider.   Irritable bowel syndrome with diarrhea    Left lumbar radiculopathy 2024   Dr. Tamala Julian and Dr. Othelia Pulling   Obesity, Class I, BMI 30-34.9    OSA on CPAP    Sleep study in remote past was normal per pt report.  OSA eval w/[place] 2018.  AHI 19/hr, desat to 81%.  Titration study 01/21/2017, optimal CPAP 6 cm H20.    Rotator cuff impingement syndrome 2018   Bilat (Dr. Tamala Julian).  Ongoing R shoulder pain: labral tear treated non-operatively=failed, so referred to ortho 11/2016.   Sleep apnea    Spondylolisthesis at L5-S1 level    Thrombocytopenia ([place]) 2014   Mild; stable 2014-2016.  Abd u/s 02/2014 NORMAL.    Past Surgical History:  Procedure Laterality Date   ALIF--percutaneous stereotactic pedicle screw placement w/allograft (Dr. Cherlyn Labella)  08/2020    BICEPS TENDON REPAIR     COLONOSCOPY W/ POLYPECTOMY  12/11/2015; 12/21/18   2017 Tubular adenoma x 1, +Diverticulosis and int hem.  Rpt 11/2018 with 1 sessile serrated poly, diverticulosis: recall 5 yrs.   EYE SURGERY     lasik   KNEE ARTHROSCOPY Bilateral    Bilat; 2003, 2004, 2010   LUMBAR [place] SURGERY  01/2018   NASAL SEPTOPLASTY W/ TURBINOPLASTY Bilateral 04/14/2015   Procedure: NASAL SEPTOPLASTY WITH BILATERAL INFERIOR  TURBINATE REDUCTION;  Surgeon: Jerrell Belfast, MD;  Location: [facility] OR;  Service: ENT;  Laterality: Bilateral;   SHOULDER ARTHROSCOPY Bilateral    Bilat; 1993, 2000, 2007   WISDOM TOOTH EXTRACTION     young adult   WRIST FRACTURE SURGERY Left 2010   Hockey injury, plate inserted    Family History  Problem Relation Age of Onset   Pancreatic cancer Paternal Grandfather    Lung cancer Paternal Grandfather         (smoker)   Colon cancer Maternal Grandmother    Colon polyps Father    Diverticulitis Mother    Esophageal cancer Neg Hx    Stomach cancer Neg Hx    Rectal cancer Neg Hx     Social History   Socioeconomic History   Marital status: Married    Spouse name: Not on file   Number of children: 4   Years of education: Not  on file   Highest education level: Not on file  Occupational History   Occupation: Chief Financial Officer  Tobacco Use   Smoking status: Never   Smokeless tobacco: Never  Vaping Use   Vaping Use: Never used  Substance and Sexual Activity   Alcohol use: Yes    Alcohol/week: 1.0 standard drink of alcohol    Types: 1 Cans of beer per week    Comment: beer occasional   Drug use: No   Sexual activity: Not on file  Other Topics Concern   Not on file  Social History Narrative   Married, 4 children from ages 14 yrs to 68 yrs.  No sibs.   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   Grad from [employer] in [place], [place].   Originally from [place], lived in [place] for a while, then relocated to [place]) about 2010.    Exercises irregularly, no particular dietary focus.   No T/A/Ds.   Social Determinants of Health   Financial Resource Strain: Not on file  Food Insecurity: Not on file  Transportation Needs: Not on file  Physical Activity: Not on file  Stress: Not on file  Social Connections: Not on file  Intimate Partner Violence: Not on file    Outpatient Medications Prior to Visit  Medication Sig Dispense Refill   dorzolamide-timolol (COSOPT) 22.3-6.8 MG/ML ophthalmic solution 1 drop 2 (two) times daily.     latanoprost (XALATAN) 0.005 % ophthalmic solution Place 1 drop into both eyes at bedtime.     loratadine (CLARITIN) 10 MG tablet Take 10 mg by mouth daily.     meloxicam (MOBIC) 15 MG tablet Take 1 tablet (15 mg total) by mouth daily. 30 tablet 0   BETIMOL 0.25 % ophthalmic solution Place 1 drop into both eyes 2 (two) times daily.  6   omeprazole (PRILOSEC) 40 MG capsule Take 1 capsule (40 mg total) by mouth daily. TAKE 1 CAPSULE BY MOUTH EVERY DAY 90 capsule 0   indomethacin (INDOCIN) 50 MG capsule 1 cap po tid prn for gout. Take with food. (Patient not taking: Reported on 11/09/2021) 10 capsule 3   No facility-administered medications prior to visit.    Allergies  Allergen Reactions   Dayquil Multi-Symptom Cold-Flu [Pseudoephedrine-Apap-Dm] Hives    ROS Review of Systems  Constitutional:  Negative for appetite change, chills, fatigue and fever.  HENT:  Negative for congestion, dental problem, ear pain and sore throat.   Eyes:  Negative for discharge, redness and visual disturbance.  Respiratory:  Negative for cough, chest tightness, shortness of breath and wheezing.   Cardiovascular:  Negative for chest pain, palpitations and leg swelling.  Gastrointestinal:  Negative for abdominal pain, blood in stool, diarrhea, nausea and vomiting.  Genitourinary:  Negative for difficulty urinating, dysuria, flank pain, frequency, hematuria and urgency.  Musculoskeletal:  Negative for arthralgias,  back pain, joint swelling, myalgias and neck stiffness.  Skin:  Negative for pallor and rash.  Neurological:  Negative for dizziness, speech difficulty, weakness and headaches.  Hematological:  Negative for adenopathy. Does not bruise/bleed easily.  Psychiatric/Behavioral:  Negative for confusion and sleep disturbance. The patient is not nervous/anxious.     PE;    11/09/2021    8:41 AM 10/23/2021    8:01 AM 10/31/2020    8:23 AM  Vitals with BMI  Height '6\' 1"'$  '6\' 1"'$  6' 1.622"  Weight 264 lbs 3 oz 262 lbs 255 lbs 6 oz  BMI 34.86 03.50 09.38  Systolic 182 993 716  Diastolic 80 80 84  Pulse 68 74 71     Gen: Alert, well appearing.  Patient is oriented to person, place, time, and situation. AFFECT: pleasant, lucid thought and speech. ENT: Ears: EACs clear, normal epithelium.  TMs with good light reflex and landmarks bilaterally.  Eyes: no injection, icteris, swelling, or exudate.  EOMI, PERRLA. Nose: no drainage or turbinate edema/swelling.  No injection or focal lesion.  Mouth: lips without lesion/swelling.  Oral mucosa pink and moist.  Dentition intact and without obvious caries or gingival swelling.  Oropharynx without erythema, exudate, or swelling.  Neck: supple/nontender.  No LAD, mass, or TM.  Carotid pulses 2+ bilaterally, without bruits. CV: RRR, no m/r/g.   LUNGS: CTA bilat, nonlabored resps, good aeration in all lung fields. ABD: soft, NT, ND, BS normal.  No hepatospenomegaly or mass.  No bruits. EXT: no clubbing, cyanosis, or edema.  Musculoskeletal: no joint swelling, erythema, warmth, or tenderness.  ROM of all joints intact. Skin - no sores or suspicious lesions or rashes or color changes  Pertinent labs:  Lab Results  Component Value Date   TSH 2.26 10/31/2020   Lab Results  Component Value Date   WBC 4.0 10/31/2020   HGB 14.2 10/31/2020   HCT 42.0 10/31/2020   MCV 91.6 10/31/2020   PLT 143.0 (L) 10/31/2020   Lab Results  Component Value Date   CREATININE  1.12 10/31/2020   BUN 15 10/31/2020   NA 137 10/31/2020   K 4.1 10/31/2020   CL 102 10/31/2020   CO2 27 10/31/2020   Lab Results  Component Value Date   ALT 13 10/31/2020   AST 15 10/31/2020   ALKPHOS 110 10/31/2020   BILITOT 0.6 10/31/2020   Lab Results  Component Value Date   CHOL 235 (H) 10/31/2020   Lab Results  Component Value Date   HDL 33.30 (L) 10/31/2020   Lab Results  Component Value Date   LDLCALC 170 (H) 08/12/2019   Lab Results  Component Value Date   TRIG 342.0 (H) 10/31/2020   Lab Results  Component Value Date   CHOLHDL 7 10/31/2020   Lab Results  Component Value Date   PSA 4.29 (H) 07/31/2016   PSA 3.53 08/04/2015   Lab Results  Component Value Date   HGBA1C 5.9 10/31/2020   ASSESSMENT AND PLAN:   #1 GERD. Well-controlled with omeprazole 40 mg daily.  He has been on this long-term.  We will check vitamin B12. Refilled omeprazole x90 days, refill x3. We discussed the eventual need for screening for Barrett's esophagus.  #2 Health maintenance exam: Reviewed age and gender appropriate health maintenance issues (prudent diet, regular exercise, health risks of tobacco and excessive alcohol, use of seatbelts, fire alarms in home, use of sunscreen).  Also reviewed age and gender appropriate health screening as well as vaccine recommendations. Vaccines: Tdap->given today.  Shingrix-->deferred.   Flu->given today. Labs: HP labs, Hba1c (prediab), and PSA. Prostate ca screening: PSA ordered Colon ca screening: recall 11/2023  An After Visit Summary was printed and given to the patient.  FOLLOW UP:  Return in about 1 year (around 11/10/2022) for annual CPE (fasting).  Signed:  Crissie Sickles, MD           11/09/2021

## 2021-11-12 ENCOUNTER — Encounter: Payer: Self-pay | Admitting: Family Medicine

## 2021-11-20 ENCOUNTER — Other Ambulatory Visit: Payer: Self-pay | Admitting: Family Medicine

## 2021-12-05 NOTE — Progress Notes (Signed)
Roy [name] Roy [name] Phone: [phone] Subjective:   Roy [name], am serving as a scribe for Dr. Hulan Saas.  I'm seeing this patient by the request  of:  [name], Roy [place], MD  CC: Foot pain follow-up  [id number]  10/23/2021 Patient has unfortunately had to gain weight secondary to his back surgery as well as started having some shoulder pain so was unable to do as much lifting.  I do believe that this is contributing to some of the discomfort and pain.  Given meloxicam, home exercises, discussed better shoes and avoiding being barefoot.  Follow-up again in 6 to 8 weeks.  Patient does have the goal in April of climbing at first.   Update 12/11/2021 Roy [name] is a 51 y.o. male coming in with complaint of L foot pain. Patient states that he has been doing well since last visit.  Patient states that he is feeling 100%.  Has been doing the exercises occasionally but feels that the better shoes have been the most beneficial.      Past Medical History:  Diagnosis Date   Allergy    Chronic rhinitis    Deviated nasal septum, hypertrophy of nasal turbinates: Dr. Dustin Flock turb reduct and septoplasty.   Colitis    Diverticulosis 2009   Colonoscopy confirmed approx 2009 (diverticulitis x 2 episodes).   Elevated PSA 08/2016   Urol eval: plan is to repeat PSA's to follow trend: PSA 01/08/17: 3.52.  PSA 6.32 Sept 2023( Dr. Louis Meckel)   GERD (gastroesophageal reflux disease)    Glaucoma    History of adenomatous polyp of colon 12/11/2015;11/2018   Recall 11/2023.   Hypercholesterolemia 2018,2019,2020   Mild; Framingham CV risk<<7.5%.  TLC.  07/2019 I recommended he see advanced lipid clinic provider.   Irritable bowel syndrome with diarrhea    Left lumbar radiculopathy 2024   Dr. Tamala Julian and Dr. Othelia Pulling   Obesity, Class I, BMI 30-34.9    OSA on CPAP    Sleep study in remote past was normal per pt report.   OSA eval w/[place] 2018.  AHI 19/hr, desat to 81%.  Titration study 01/21/2017, optimal CPAP 6 cm H20.    Rotator cuff impingement syndrome 2018   Bilat (Dr. Tamala Julian).  Ongoing R shoulder pain: labral tear treated non-operatively=failed, so referred to ortho 11/2016.   Sleep apnea    Spondylolisthesis at L5-S1 level    Thrombocytopenia ([place]) 2014   Mild; stable 2014-2016.  Abd u/s 02/2014 NORMAL.   Past Surgical History:  Procedure Laterality Date   ALIF--percutaneous stereotactic pedicle screw placement w/allograft (Dr. Cherlyn Labella)  08/2020   BICEPS TENDON REPAIR     COLONOSCOPY W/ POLYPECTOMY  12/11/2015; 12/21/18   2017 Tubular adenoma x 1, +Diverticulosis and int hem.  Rpt 11/2018 with 1 sessile serrated poly, diverticulosis: recall 5 yrs.   EYE SURGERY     lasik   KNEE ARTHROSCOPY Bilateral    Bilat; 2003, 2004, 2010   LUMBAR [place] SURGERY  01/2018   NASAL SEPTOPLASTY W/ TURBINOPLASTY Bilateral 04/14/2015   Procedure: NASAL SEPTOPLASTY WITH BILATERAL INFERIOR  TURBINATE REDUCTION;  Surgeon: Jerrell Belfast, MD;  Location: [facility] OR;  Service: ENT;  Laterality: Bilateral;   SHOULDER ARTHROSCOPY Bilateral    Bilat; 1993, 2000, 2007   WISDOM TOOTH EXTRACTION     young adult   WRIST FRACTURE SURGERY Left 2010   Hockey injury, plate inserted   Social History   Socioeconomic History  Marital status: Married    Spouse name: Not on file   Number of children: 4   Years of education: Not on file   Highest education level: Not on file  Occupational History   Occupation: Chief Financial Officer  Tobacco Use   Smoking status: Never   Smokeless tobacco: Never  Vaping Use   Vaping Use: Never used  Substance and Sexual Activity   Alcohol use: Yes    Alcohol/week: 1.0 standard drink of alcohol    Types: 1 Cans of beer per week    Comment: beer occasional   Drug use: [name]   Sexual activity: Not on file  Other Topics Concern   Not on file  Social History Narrative   Married, 4 children from ages 51  yrs to 60 yrs.  [name] sibs.   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   Grad from [employer] in [place], [place].   Originally from [place], lived in [place] for a while, then relocated to [place] [facility] [place] [facility] [employer] [place]) about 2010.   Exercises irregularly, [name] particular dietary focus.   [name] T/A/Ds.   Social Determinants of Health   Financial Resource Strain: Not on file  Food Insecurity: Not on file  Transportation Needs: Not on file  Physical Activity: Not on file  Stress: Not on file  Social Connections: Not on file   Allergies  Allergen Reactions   Dayquil Multi-Symptom Cold-Flu [Pseudoephedrine-Apap-Dm] Hives   Family History  Problem Relation Age of Onset   Pancreatic cancer Paternal Grandfather    Lung cancer Paternal Grandfather         (smoker)   Colon cancer Maternal Grandmother    Colon polyps Father    Diverticulitis Mother    Esophageal cancer Neg Hx    Stomach cancer Neg Hx    Rectal cancer Neg Hx       Current Outpatient Medications (Respiratory):    loratadine (CLARITIN) 10 MG tablet, Take 10 mg by mouth daily.  Current Outpatient Medications (Analgesics):    indomethacin (INDOCIN) 50 MG capsule, 1 cap po tid prn for gout. Take with food.   meloxicam (MOBIC) 15 MG tablet, TAKE 1 TABLET(15 MG) BY MOUTH DAILY   Current Outpatient Medications (Other):    dorzolamide-timolol (COSOPT) 22.3-6.8 MG/ML ophthalmic solution, 1 drop 2 (two) times daily.   latanoprost (XALATAN) 0.005 % ophthalmic solution, Place 1 drop into both eyes at bedtime.   omeprazole (PRILOSEC) 40 MG capsule, Take 1 capsule (40 mg total) by mouth daily. TAKE 1 CAPSULE BY MOUTH EVERY DAY   Objective  Blood pressure 102/78, pulse 76, height '6\' 1"'$  (1.854 m), weight 262 lb (118.8 kg), SpO2 97 %.   General: [name] apparent distress alert and oriented x3 mood and affect normal, dressed appropriately.  HEENT: Pupils equal, extraocular movements intact  Respiratory: Patient's speak in full  sentences and does not appear short of breath  Cardiovascular: [name] lower extremity edema, non tender, [name] erythema  Left foot exam does have full range of motion noted.  Nontender on exam today.  Patient has very mild tightness of the posterior cord.    Impression and Recommendations:

## 2021-12-11 ENCOUNTER — Ambulatory Visit (INDEPENDENT_AMBULATORY_CARE_PROVIDER_SITE_OTHER): Payer: 59 | Admitting: Family Medicine

## 2021-12-11 ENCOUNTER — Ambulatory Visit: Payer: Self-pay

## 2021-12-11 VITALS — BP 102/78 | HR 76 | Ht 73.0 in | Wt 262.0 lb

## 2021-12-11 DIAGNOSIS — M722 Plantar fascial fibromatosis: Secondary | ICD-10-CM

## 2021-12-11 DIAGNOSIS — M79672 Pain in left foot: Secondary | ICD-10-CM

## 2021-12-11 NOTE — Assessment & Plan Note (Addendum)
Patient is 100% improved because patient all the exercises and got a proper shoes.  Has been avoiding being barefoot.  Patient can follow-up as needed discussed different shoes though still at great length and discussed the intermittent use of the meloxicam.

## 2021-12-17 ENCOUNTER — Other Ambulatory Visit: Payer: Self-pay | Admitting: Family Medicine

## 2022-01-14 ENCOUNTER — Other Ambulatory Visit: Payer: Self-pay | Admitting: Family Medicine

## 2022-01-29 ENCOUNTER — Other Ambulatory Visit: Payer: Self-pay | Admitting: Family Medicine

## 2022-02-12 ENCOUNTER — Other Ambulatory Visit: Payer: Self-pay | Admitting: Family Medicine

## 2022-06-20 ENCOUNTER — Other Ambulatory Visit (HOSPITAL_COMMUNITY): Payer: Self-pay | Admitting: Surgical

## 2022-06-20 ENCOUNTER — Ambulatory Visit (HOSPITAL_COMMUNITY)
Admission: RE | Admit: 2022-06-20 | Discharge: 2022-06-20 | Disposition: A | Discharge status: Home / Self Care | Payer: 59 | Source: Ambulatory Visit | Attending: Vascular Surgery | Admitting: Vascular Surgery

## 2022-06-20 DIAGNOSIS — R609 Edema, unspecified: Secondary | ICD-10-CM

## 2022-10-10 ENCOUNTER — Encounter (INDEPENDENT_AMBULATORY_CARE_PROVIDER_SITE_OTHER): Payer: Self-pay

## 2022-10-23 ENCOUNTER — Encounter: Payer: Self-pay | Admitting: Family Medicine

## 2022-10-23 ENCOUNTER — Other Ambulatory Visit: Payer: Self-pay | Admitting: Family Medicine

## 2022-11-11 ENCOUNTER — Encounter: Payer: Self-pay | Admitting: Family Medicine

## 2022-11-11 ENCOUNTER — Ambulatory Visit (INDEPENDENT_AMBULATORY_CARE_PROVIDER_SITE_OTHER): Payer: 59 | Admitting: Family Medicine

## 2022-11-11 VITALS — BP 124/83 | HR 62 | Ht 74.0 in | Wt 262.0 lb

## 2022-11-11 DIAGNOSIS — Z125 Encounter for screening for malignant neoplasm of prostate: Secondary | ICD-10-CM

## 2022-11-11 DIAGNOSIS — Z0000 Encounter for general adult medical examination without abnormal findings: Secondary | ICD-10-CM

## 2022-11-11 DIAGNOSIS — R7303 Prediabetes: Secondary | ICD-10-CM

## 2022-11-11 DIAGNOSIS — R972 Elevated prostate specific antigen [PSA]: Secondary | ICD-10-CM | POA: Diagnosis not present

## 2022-11-11 DIAGNOSIS — Z23 Encounter for immunization: Secondary | ICD-10-CM | POA: Diagnosis not present

## 2022-11-11 MED ORDER — OMEPRAZOLE 40 MG PO CPDR: 40.0000 mg | DELAYED_RELEASE_CAPSULE | Freq: Every day | ORAL | 3 refills | Status: DC

## 2022-11-11 NOTE — Progress Notes (Signed)
Office Note 11/11/2022  CC:  Chief Complaint  Patient presents with   Annual Exam    Pt is fasting.     HPI:  Patient is a 52 y.o. male who is here for annual health maintenance exam.  PSA elev --> he will be making follow-up appointment with urology soon   GERD--> takes 40 mg Prilosec daily.  If he stops this medication he gets rebound reflux quite easily.  Past Medical History:  Diagnosis Date   Allergy    Chronic rhinitis    Deviated nasal septum, hypertrophy of nasal turbinates: Dr. Ricardo Jericho turb reduct and septoplasty.   Colitis    Diverticulosis 2009   Colonoscopy confirmed approx 2009 (diverticulitis x 2 episodes).   Elevated PSA 08/2016   Urol eval: plan is to repeat PSA's to follow trend: PSA 01/08/17: 3.52.  PSA 6.32 Sept 2023( Dr. Marlou Porch)   GERD (gastroesophageal reflux disease)    Glaucoma    History of adenomatous polyp of colon 12/11/2015;11/2018   Recall 11/2023.   Hypercholesterolemia 2018,2019,2020   Mild; Framingham CV risk<<7.5%.  TLC.  07/2019 I recommended he see advanced lipid clinic provider.   Irritable bowel syndrome with diarrhea    Left lumbar radiculopathy 2024   Dr. Katrinka Blazing and Dr. Marissa Nestle   Obesity, Class I, BMI 30-34.9    OSA on CPAP    Sleep study in remote past was normal per pt report.  OSA eval w/[place] 2018.  AHI 19/hr, desat to 81%.  Titration study 01/21/2017, optimal CPAP 6 cm H20.    Rotator cuff impingement syndrome 2018   Bilat (Dr. Katrinka Blazing).  Ongoing R shoulder pain: labral tear treated non-operatively=failed, so referred to ortho 11/2016.   Sleep apnea    Spondylolisthesis at L5-S1 level    Thrombocytopenia (HCC) 2014   Mild; stable 2014-2016.  Abd u/s 02/2014 NORMAL.    Past Surgical History:  Procedure Laterality Date   ALIF--percutaneous stereotactic pedicle screw placement w/allograft (Dr. Asher Muir)  08/2020   BICEPS TENDON REPAIR     COLONOSCOPY W/ POLYPECTOMY  12/11/2015; 12/21/18   2017 Tubular adenoma  x 1, +Diverticulosis and int hem.  Rpt 11/2018 with 1 sessile serrated poly, diverticulosis: recall 5 yrs.   EYE SURGERY     lasik   KNEE ARTHROSCOPY Bilateral    Bilat; 2003, 2004, 2010   LUMBAR DISC SURGERY  01/2018   NASAL SEPTOPLASTY W/ TURBINOPLASTY Bilateral 04/14/2015   Procedure: NASAL SEPTOPLASTY WITH BILATERAL INFERIOR  TURBINATE REDUCTION;  Surgeon: Osborn Coho, MD;  Location: [facility] OR;  Service: ENT;  Laterality: Bilateral;   SHOULDER ARTHROSCOPY Bilateral    Bilat; 1993, 2000, 2007   WISDOM TOOTH EXTRACTION     young adult   WRIST FRACTURE SURGERY Left 2010   Hockey injury, plate inserted    Family History  Problem Relation Age of Onset   Pancreatic cancer Paternal Grandfather    Lung cancer Paternal Grandfather         (smoker)   Colon cancer Maternal Grandmother    Colon polyps Father    Diverticulitis Mother    Esophageal cancer Neg Hx    Stomach cancer Neg Hx    Rectal cancer Neg Hx     Social History   Socioeconomic History   Marital status: Married    Spouse name: Not on file   Number of children: 4   Years of education: Not on file   Highest education level: Not on file  Occupational History   Occupation: Art gallery manager  Tobacco Use   Smoking status: Never   Smokeless tobacco: Never  Vaping Use   Vaping status: Never Used  Substance and Sexual Activity   Alcohol use: Yes    Alcohol/week: 1.0 standard drink of alcohol    Types: 1 Cans of beer per week    Comment: beer occasional   Drug use: No   Sexual activity: Not on file  Other Topics Concern   Not on file  Social History Narrative   Married, 4 children from ages 26 yrs to 14 yrs.  No sibs.   Occupation: Doctor, hospital for [employer].   Grad from [employer] in [place], [place].   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   Exercises irregularly, no particular dietary focus.   No T/A/Ds.   Social Determinants of  Health   Financial Resource Strain: Low Risk  (08/24/2020)   Received from [facility]   Overall Financial Resource Strain (CARDIA)    Difficulty of Paying Living Expenses: Not hard at all  Food Insecurity: No Food Insecurity (07/11/2021)   Received from [facility], [facility]   Hunger Vital Sign    Worried About Running Out of Food in the Last Year: Never true    Ran Out of Food in the Last Year: Never true  Transportation Needs: Not on file  Physical Activity: Not on file  Stress: No Stress Concern Present (08/22/2020)   Received from [facility], [facility] of Occupational Health - Occupational Stress Questionnaire    Feeling of Stress : Only a little  Social Connections: Unknown (06/27/2021)   Received from [facility], [facility]   Social Network    Social Network: Not on file  Intimate Partner Violence: Unknown (05/31/2021)   Received from [facility], [facility]   HITS    Physically Hurt: Not on file    Insult or Talk Down To: Not on file    Threaten Physical Harm: Not on file    Scream or Curse: Not on file    Outpatient Medications Prior to Visit  Medication Sig Dispense Refill   dorzolamide-timolol (COSOPT) 22.3-6.8 MG/ML ophthalmic solution 1 drop 2 (two) times daily.     indomethacin (INDOCIN) 50 MG capsule 1 cap po tid prn for gout. Take with food. 10 capsule 3   latanoprost (XALATAN) 0.005 % ophthalmic solution Place 1 drop into both eyes at bedtime.     loratadine (CLARITIN) 10 MG tablet Take 10 mg by mouth daily.     meloxicam (MOBIC) 15 MG tablet TAKE 1 TABLET(15 MG) BY MOUTH DAILY 30 tablet 0   omeprazole (PRILOSEC) 40 MG capsule TAKE 1 CAPSULE(40 MG) BY MOUTH EVERY DAY 90 capsule 2   No facility-administered medications prior to visit.    Allergies  Allergen Reactions   Dayquil Multi-Symptom Cold-Flu [Pseudoephedrine-Apap-Dm] Hives    Review of Systems  Constitutional:  Negative for appetite change,  chills, fatigue and fever.  HENT:  Negative for congestion, dental problem, ear pain and sore throat.   Eyes:  Negative for discharge, redness and visual disturbance.  Respiratory:  Negative for cough, chest tightness, shortness of breath and wheezing.   Cardiovascular:  Negative for chest pain, palpitations and leg swelling.  Gastrointestinal:  Negative for abdominal pain, blood in stool, diarrhea, nausea and vomiting.  Genitourinary:  Negative for difficulty urinating, dysuria, flank pain, frequency, hematuria and urgency.  Musculoskeletal:  Negative for  arthralgias, back pain, joint swelling, myalgias and neck stiffness.  Skin:  Negative for pallor and rash.  Neurological:  Negative for dizziness, speech difficulty, weakness and headaches.  Hematological:  Negative for adenopathy. Does not bruise/bleed easily.  Psychiatric/Behavioral:  Negative for confusion and sleep disturbance. The patient is not nervous/anxious.     PE;    11/11/2022    8:03 AM 12/11/2021    7:55 AM 11/09/2021    8:41 AM  Vitals with BMI  Height 6\' 2"  6\' 1"  6\' 1"   Weight 262 lbs 262 lbs 264 lbs 3 oz  BMI 33.62 34.57 34.86  Systolic 124 102 409  Diastolic 83 78 80  Pulse 62 76 68   Gen: Alert, well appearing.  Patient is oriented to person, place, time, and situation. AFFECT: pleasant, lucid thought and speech. ENT: Ears: EACs clear, normal epithelium.  TMs with good light reflex and landmarks bilaterally.  Eyes: no injection, icteris, swelling, or exudate.  EOMI, PERRLA. Nose: no drainage or turbinate edema/swelling.  No injection or focal lesion.  Mouth: lips without lesion/swelling.  Oral mucosa pink and moist.  Dentition intact and without obvious caries or gingival swelling.  Oropharynx without erythema, exudate, or swelling.  Neck: supple/nontender.  No LAD, mass, or TM.  Carotid pulses 2+ bilaterally, without bruits. CV: RRR, no m/r/g.   LUNGS: CTA bilat, nonlabored resps, good aeration in all lung  fields. ABD: soft, NT, ND, BS normal.  No hepatospenomegaly or mass.  No bruits. EXT: no clubbing, cyanosis, or edema.  Musculoskeletal: no joint swelling, erythema, warmth, or tenderness.  ROM of all joints intact. Skin - no sores or suspicious lesions or rashes or color changes  Pertinent labs:  Lab Results  Component Value Date   TSH 2.08 11/09/2021   Lab Results  Component Value Date   WBC 4.2 11/09/2021   HGB 15.1 11/09/2021   HCT 43.6 11/09/2021   MCV 93.3 11/09/2021   PLT 139.0 (L) 11/09/2021   Lab Results  Component Value Date   CREATININE 1.17 11/09/2021   BUN 18 11/09/2021   NA 139 11/09/2021   K 4.4 11/09/2021   CL 105 11/09/2021   CO2 27 11/09/2021   Lab Results  Component Value Date   ALT 14 11/09/2021   AST 17 11/09/2021   ALKPHOS 95 11/09/2021   BILITOT 0.9 11/09/2021   Lab Results  Component Value Date   CHOL 234 (H) 11/09/2021   Lab Results  Component Value Date   HDL 34.20 (L) 11/09/2021   Lab Results  Component Value Date   LDLCALC 162 (H) 11/09/2021   Lab Results  Component Value Date   TRIG 189.0 (H) 11/09/2021   Lab Results  Component Value Date   CHOLHDL 7 11/09/2021   Lab Results  Component Value Date   PSA 6.32 (H) 11/09/2021   PSA 4.29 (H) 07/31/2016   PSA 3.53 08/04/2015   Lab Results  Component Value Date   HGBA1C 5.8 11/09/2021   Lab Results  Component Value Date   VITAMINB12 159 (L) 11/09/2021   ASSESSMENT AND PLAN:   #1 health maintenance exam: Reviewed age and gender appropriate health maintenance issues (prudent diet, regular exercise, health risks of tobacco and excessive alcohol, use of seatbelts, fire alarms in home, use of sunscreen).  Also reviewed age and gender appropriate health screening as well as vaccine recommendations. Vaccines:   Shingrix--> #1 today.   Flu->given today. Labs: He got biometric screening through his employer earlier this year.  He will get [place] copies of these. He does not recall  any results that were of concern.   Prostate ca screening: History of elevated PSA.  He is followed by urology and will make the appointment with them soon. Colon ca screening: recall 11/2023  #2 GERD, well-controlled on Prilosec 40 mg a day.  An After Visit Summary was printed and given to the patient.  FOLLOW UP:  Return in about 1 year (around 11/11/2023) for annual CPE (fasting).  Signed:  Santiago Bumpers, MD           11/11/2022

## 2022-12-12 LAB — PSA: PSA: 7.35

## 2023-01-13 ENCOUNTER — Ambulatory Visit (INDEPENDENT_AMBULATORY_CARE_PROVIDER_SITE_OTHER): Payer: 59

## 2023-01-13 DIAGNOSIS — Z23 Encounter for immunization: Secondary | ICD-10-CM

## 2023-03-03 HISTORY — PX: PROSTATE BIOPSY: SHX241

## 2023-03-11 ENCOUNTER — Encounter: Payer: Self-pay | Admitting: Internal Medicine

## 2023-03-11 ENCOUNTER — Telehealth: Payer: Self-pay | Admitting: Internal Medicine

## 2023-03-11 NOTE — Telephone Encounter (Signed)
 Patient called and stated that he was recently diagnosed with prostate cancer. Patient stated that they advised him he was at high risk of colon cancer. Patient is wanting to know if he should have his colonoscopy done before his prostate surgery. Patient is requesting a call back. Please advise.

## 2023-03-11 NOTE — Telephone Encounter (Signed)
 Patient has been advised of recommendation   Patient has been scheduled for colonoscopy on 3/10 at 7:00 with PV on 2/24 at 4:00

## 2023-03-11 NOTE — Telephone Encounter (Signed)
 I am not aware of higher risk of colon cancer in pt's with prostate cancer That said he is due surveillance colon later in 2025 Ok to proceed now if that is his preference [employer]

## 2023-03-12 ENCOUNTER — Ambulatory Visit (INDEPENDENT_AMBULATORY_CARE_PROVIDER_SITE_OTHER): Payer: 59 | Admitting: Family Medicine

## 2023-03-12 ENCOUNTER — Encounter: Payer: Self-pay | Admitting: Family Medicine

## 2023-03-12 ENCOUNTER — Telehealth: Payer: Self-pay | Admitting: Radiation Oncology

## 2023-03-12 VITALS — BP 122/80 | HR 66 | Wt 268.0 lb

## 2023-03-12 DIAGNOSIS — R1032 Left lower quadrant pain: Secondary | ICD-10-CM

## 2023-03-12 DIAGNOSIS — R1031 Right lower quadrant pain: Secondary | ICD-10-CM

## 2023-03-12 DIAGNOSIS — K5792 Diverticulitis of intestine, part unspecified, without perforation or abscess without bleeding: Secondary | ICD-10-CM

## 2023-03-12 NOTE — Telephone Encounter (Signed)
 Left message for patient to call back to schedule consult per 11/4 referral.

## 2023-03-12 NOTE — Patient Instructions (Signed)
 Mediterranean Diet A Mediterranean diet is based on the traditions of countries on the [employer]. It focuses on eating more: Fruits and vegetables. Whole grains, beans, nuts, and seeds. Heart-healthy fats. These are fats that are good for your heart. It involves eating less: Dairy. Meat and eggs. Processed foods with added sugar, salt, and fat. This type of diet can help prevent certain conditions. It can also improve outcomes if you have a long-term (chronic) disease, such as kidney or heart disease. What are tips for following this plan? Reading food labels Check packaged foods for: The serving size. For foods such as rice and pasta, the serving size is the amount of cooked product, not dry. The total fat. Avoid foods with saturated fat or trans fat. Added sugars, such as corn syrup. Shopping  Try to have a balanced diet. Buy a variety of foods, such as: Fresh fruits and vegetables. You may be able to get these from local farmers markets. You can also buy them frozen. Grains, beans, nuts, and seeds. Some of these can be bought in bulk. Fresh seafood. Poultry and eggs. Low-fat dairy products. Buy whole ingredients instead of foods that have already been packaged. If you can't get fresh seafood, buy precooked frozen shrimp or canned fish, such as tuna, salmon, or sardines. Stock your pantry so you always have certain foods on hand, such as olive oil, canned tuna, canned tomatoes, rice, pasta, and beans. Cooking Cook foods with extra-virgin olive oil instead of using butter or other vegetable oils. Have meat as a side dish. Have vegetables or grains as your main dish. This means having meat in small portions or adding small amounts of meat to foods like pasta or stew. Use beans or vegetables instead of meat in common dishes like chili or lasagna. Try out different cooking methods. Try roasting, broiling, steaming, and sauting vegetables. Add frozen vegetables to soups, stews,  pasta, or rice. Add nuts or seeds for added healthy fats and plant protein at each meal. You can add these to yogurt, salads, or vegetable dishes. Marinate fish or vegetables using olive oil, lemon juice, garlic, and fresh herbs. Meal planning Plan to eat a vegetarian meal one day each week. Try to work up to two vegetarian meals, if possible. Eat seafood two or more times a week. Have healthy snacks on hand. These may include: Vegetable sticks with hummus. Greek yogurt. Fruit and nut trail mix. Eat balanced meals. These should include: Fruit: 2-3 servings a day. Vegetables: 4-5 servings a day. Low-fat dairy: 2 servings a day. Fish, poultry, or lean meat: 1 serving a day. Beans and legumes: 2 or more servings a week. Nuts and seeds: 1-2 servings a day. Whole grains: 6-8 servings a day. Extra-virgin olive oil: 3-4 servings a day. Limit red meat and sweets to just a few servings a month. Lifestyle  Try to cook and eat meals with your family. Drink enough fluid to keep your pee (urine) pale yellow. Be active every day. This includes: Aerobic exercise, which is exercise that causes your heart to beat faster. Examples include running and swimming. Leisure activities like gardening, walking, or housework. Get 7-8 hours of sleep each night. Drink red wine if your provider says you can. A glass of wine is 5 oz (150 mL). You may be allowed to have: Up to 1 glass a day if you're male and not pregnant. Up to 2 glasses a day if you're male. What foods should I eat? Fruits Apples. Apricots. Avocado.  Berries. Bananas. Cherries. Dates. Figs. Grapes. Lemons. Melon. Oranges. Peaches. Plums. Pomegranate. Vegetables Artichokes. Beets. Broccoli. Cabbage. Carrots. Eggplant. Green beans. Chard. Kale. Spinach. Onions. Leeks. Peas. Squash. Tomatoes. Peppers. Radishes. Grains Whole-grain pasta. Brown rice. Bulgur wheat. Polenta. Couscous. Whole-wheat bread. Orpah Cobb. Meats and other  proteins Beans. Almonds. Sunflower seeds. Pine nuts. Peanuts. Cod. Salmon. Scallops. Shrimp. Tuna. Tilapia. Clams. Oysters. Eggs. Chicken or [place] without skin. Dairy Low-fat milk. Cheese. Greek yogurt. Fats and oils Extra-virgin olive oil. Avocado oil. Grapeseed oil. Beverages Water. Red wine. Herbal tea. Sweets and desserts Greek yogurt with honey. Baked apples. Poached pears. Trail mix. Seasonings and condiments Basil. Cilantro. Coriander. Cumin. Mint. Parsley. Sage. Rosemary. Tarragon. Garlic. Oregano. Thyme. Pepper. Balsamic vinegar. Tahini. Hummus. Tomato sauce. Olives. Mushrooms. The items listed above may not be all the foods and drinks you can have. Talk to a dietitian to learn more. What foods should I limit? This is a list of foods that should be eaten rarely. Fruits Fruit canned in syrup. Vegetables Deep-fried potatoes, like [place] fries. Grains Packaged pasta or rice dishes. Cereal with added sugar. Snacks with added sugar. Meats and other proteins Beef. Pork. Lamb. Chicken or [place] with skin. Hot dogs. Tomasa Blase. Dairy Ice cream. Sour cream. Whole milk. Fats and oils Butter. Canola oil. Vegetable oil. Beef fat (tallow). Lard. Beverages Juice. Sugar-sweetened soft drinks. Beer. Liquor and spirits. Sweets and desserts Cookies. Cakes. Pies. Candy. Seasonings and condiments Mayonnaise. Pre-made sauces and marinades. The items listed above may not be all the foods and drinks you should limit. Talk to a dietitian to learn more. Where to find more information American Heart Association (AHA): heart.org This information is not intended to replace advice given to you by your health care provider. Make sure you discuss any questions you have with your health care provider. Document Revised: 05/26/2022 Document Reviewed: 05/26/2022 Elsevier Patient Education  2024 [employer].

## 2023-03-12 NOTE — Progress Notes (Signed)
GU Location of Tumor / Histology: Prostate Ca  If Prostate Cancer, Gleason Score is (3 + 4) and PSA is (7.35 on 12/12/2022)  Biopsies      Past/Anticipated interventions by urology, if any: NA  Past/Anticipated interventions by medical oncology, if any: NA  Weight changes, if any:  No  IPSS:  20 SHIM:  20  Bowel/Bladder complaints, if any:  No   Nausea/Vomiting, if any: No  Pain issues, if any:  0/10  SAFETY ISSUES: Prior radiation?  No Pacemaker/ICD? No Possible current pregnancy? Male Is the patient on methotrexate? No  Current Complaints / other details:

## 2023-03-12 NOTE — Progress Notes (Signed)
OFFICE VISIT  03/16/2023  CC:  Chief Complaint  Patient presents with   Abdominal Pain    A few weeks, lower abdomen; Pt thought he was experiencing the onset of diverticulitis. Feels pain/discomfort more when he moves a certain way. Denies any constipation. Pain has eased.     Patient is a 53 y.o. male who presents for abdominal pain.  HPI: Approx 3 wks ago started getting bilat lower abd pain, constant, nagging but not severe. Appetite ok, no fever or stool abnormalities, no n/v.  Moving torso certain ways exacerbates the pain.  No lower abd/groin bulging. No preceding strain or overuse. Sx's have improved signif over the last week or so.  He has had multiple episodes of diverticulitis in the past. No abdominal imaging is noted in EMR. Last colonoscopy 2020:  1 polyp, diverticulosis.  Past Medical History:  Diagnosis Date   Chronic rhinitis    Deviated nasal septum, hypertrophy of nasal turbinates: Dr. Ricardo Jericho turb reduct and septoplasty.   Colitis    Diverticulosis 2009   Colonoscopy confirmed approx 2009 (diverticulitis x 2 episodes).   GERD (gastroesophageal reflux disease)    Glaucoma    History of adenomatous polyp of colon 12/11/2015;11/2018   Recall 11/2023.   Hypercholesterolemia 2018,2019,2020   Mild; Framingham CV risk<<7.5%.  TLC.  07/2019 I recommended he see advanced lipid clinic provider.   Irritable bowel syndrome with diarrhea    Left lumbar radiculopathy 2024   Dr. Katrinka Blazing and Dr. Marissa Nestle   Obesity, Class I, BMI 30-34.9    OSA on CPAP    Sleep study in remote past was normal per pt report.  OSA eval w/[place] 2018.  AHI 19/hr, desat to 81%.  Titration study 01/21/2017, optimal CPAP 6 cm H20.    Prostate cancer (HCC)    elev PSA since 2018.  Prostate bx + 2024.   Spondylolisthesis at L5-S1 level    Thrombocytopenia (HCC) 2014   Mild; stable 2014-2016.  Abd u/s 02/2014 NORMAL.    Past Surgical History:  Procedure Laterality Date    ALIF--percutaneous stereotactic pedicle screw placement w/allograft (Dr. Asher Muir)  08/2020   BICEPS TENDON REPAIR     COLONOSCOPY W/ POLYPECTOMY  12/11/2015; 12/21/18   2017 Tubular adenoma x 1, +Diverticulosis and int hem.  Rpt 11/2018 with 1 sessile serrated poly, diverticulosis: recall 5 yrs.   EYE SURGERY     lasik   KNEE ARTHROSCOPY Bilateral    Bilat; 2003, 2004, 2010   LUMBAR DISC SURGERY  01/2018   NASAL SEPTOPLASTY W/ TURBINOPLASTY Bilateral 04/14/2015   Procedure: NASAL SEPTOPLASTY WITH BILATERAL INFERIOR  TURBINATE REDUCTION;  Surgeon: Osborn Coho, MD;  Location: [facility] OR;  Service: ENT;  Laterality: Bilateral;   PROSTATE BIOPSY     +cancer 2024   SHOULDER ARTHROSCOPY Bilateral    Bilat; 1993, 2000, 2007   WISDOM TOOTH EXTRACTION     young adult   WRIST FRACTURE SURGERY Left 2010   Hockey injury, plate inserted    Outpatient Medications Prior to Visit  Medication Sig Dispense Refill   dorzolamide-timolol (COSOPT) 22.3-6.8 MG/ML ophthalmic solution 1 drop 2 (two) times daily.     indomethacin (INDOCIN) 50 MG capsule 1 cap po tid prn for gout. Take with food. 10 capsule 3   latanoprost (XALATAN) 0.005 % ophthalmic solution Place 1 drop into both eyes at bedtime.     loratadine (CLARITIN) 10 MG tablet Take 10 mg by mouth daily.     meloxicam (MOBIC) 15 MG tablet TAKE  1 TABLET(15 MG) BY MOUTH DAILY 30 tablet 0   omeprazole (PRILOSEC) 40 MG capsule Take 1 capsule (40 mg total) by mouth daily. 90 capsule 3   No facility-administered medications prior to visit.    Allergies  Allergen Reactions   Dayquil Multi-Symptom Cold-Flu [Pseudoephedrine-Apap-Dm] Hives    Review of Systems  As per HPI  PE:    03/12/2023    1:31 PM 11/11/2022    8:03 AM 12/11/2021    7:55 AM  Vitals with BMI  Height  6\' 2"  6\' 1"   Weight 268 lbs 262 lbs 262 lbs  BMI  33.62 34.57  Systolic 122 124 098  Diastolic 80 83 78  Pulse 66 62 76     Physical Exam  Gen: Alert, well  appearing.  Patient is oriented to person, place, time, and situation. AFFECT: pleasant, lucid thought and speech. CV: RRR, no m/r/g.   LUNGS: CTA bilat, nonlabored resps, good aeration in all lung fields. ABD: soft, NT, ND, BS normal.  No hepatospenomegaly or mass.  No bruits.   LABS:  Last CBC Lab Results  Component Value Date   WBC 4.2 11/09/2021   HGB 15.1 11/09/2021   HCT 43.6 11/09/2021   MCV 93.3 11/09/2021   MCH 32.3 04/10/2015   RDW 13.5 11/09/2021   PLT 139.0 (L) 11/09/2021   Last metabolic panel Lab Results  Component Value Date   GLUCOSE 104 (H) 11/09/2021   NA 139 11/09/2021   K 4.4 11/09/2021   CL 105 11/09/2021   CO2 27 11/09/2021   BUN 18 11/09/2021   CREATININE 1.17 11/09/2021   GFR 72.51 11/09/2021   CALCIUM 9.3 11/09/2021   PROT 7.4 11/09/2021   ALBUMIN 4.1 11/09/2021   BILITOT 0.9 11/09/2021   ALKPHOS 95 11/09/2021   AST 17 11/09/2021   ALT 14 11/09/2021   ANIONGAP 8 04/10/2015   IMPRESSION AND PLAN:  1) Diffuse lower abd pain, suspect mild diverticulitis. Decided against abx at this time but if not continuing to improve in the next 1 wk he'll call or send mychart message and we'll get abx started and determine approp f/u.  2) Prostate cancer: Pos prostate bx 03/03/23. Pt leaning towards prostatectomy at this time.  An After Visit Summary was printed and given to the patient.  FOLLOW UP: Return if symptoms worsen or fail to improve.  Signed:  Santiago Bumpers, MD           03/16/2023

## 2023-03-16 ENCOUNTER — Encounter: Payer: Self-pay | Admitting: Family Medicine

## 2023-03-21 ENCOUNTER — Encounter: Payer: Self-pay | Admitting: Radiation Oncology

## 2023-03-21 ENCOUNTER — Ambulatory Visit
Admission: RE | Admit: 2023-03-21 | Discharge: 2023-03-21 | Disposition: A | Discharge status: Home / Self Care | Payer: 59 | Source: Ambulatory Visit | Attending: Radiation Oncology | Admitting: Radiation Oncology

## 2023-03-21 ENCOUNTER — Encounter: Payer: Self-pay | Admitting: Urology

## 2023-03-21 ENCOUNTER — Ambulatory Visit
Admission: RE | Admit: 2023-03-21 | Discharge: 2023-03-21 | Disposition: A | Discharge status: Home / Self Care | Payer: 59 | Source: Ambulatory Visit | Attending: Radiation Oncology

## 2023-03-21 VITALS — BP 142/86 | HR 66 | Temp 97.8°F | Resp 20 | Ht 74.0 in | Wt 267.0 lb

## 2023-03-21 DIAGNOSIS — Z800 Family history of malignant neoplasm of digestive organs: Secondary | ICD-10-CM | POA: Insufficient documentation

## 2023-03-21 DIAGNOSIS — M4310 Spondylolisthesis, site unspecified: Secondary | ICD-10-CM | POA: Insufficient documentation

## 2023-03-21 DIAGNOSIS — K219 Gastro-esophageal reflux disease without esophagitis: Secondary | ICD-10-CM | POA: Diagnosis not present

## 2023-03-21 DIAGNOSIS — E7800 Pure hypercholesterolemia, unspecified: Secondary | ICD-10-CM | POA: Diagnosis not present

## 2023-03-21 DIAGNOSIS — J310 Chronic rhinitis: Secondary | ICD-10-CM | POA: Diagnosis not present

## 2023-03-21 DIAGNOSIS — Z79899 Other long term (current) drug therapy: Secondary | ICD-10-CM | POA: Insufficient documentation

## 2023-03-21 DIAGNOSIS — C61 Malignant neoplasm of prostate: Secondary | ICD-10-CM | POA: Insufficient documentation

## 2023-03-21 DIAGNOSIS — Z801 Family history of malignant neoplasm of trachea, bronchus and lung: Secondary | ICD-10-CM | POA: Diagnosis not present

## 2023-03-21 DIAGNOSIS — G4730 Sleep apnea, unspecified: Secondary | ICD-10-CM | POA: Diagnosis not present

## 2023-03-21 DIAGNOSIS — D696 Thrombocytopenia, unspecified: Secondary | ICD-10-CM | POA: Diagnosis not present

## 2023-03-21 DIAGNOSIS — Z860101 Personal history of adenomatous and serrated colon polyps: Secondary | ICD-10-CM | POA: Insufficient documentation

## 2023-03-21 NOTE — Progress Notes (Signed)
Radiation Oncology         [phone]) [phone] ________________________________  Initial Outpatient Consultation  Name: Roy [name] MRN: [id number]  Date: 03/21/2023  DOB: 12-26-1970  CC:[name], Maryjean Morn, MD  Crist Fat, MD   REFERRING PHYSICIAN: Crist Fat, MD  DIAGNOSIS: 53 y.o. gentleman with Stage T1c adenocarcinoma of the prostate with Gleason score of 3+4, and PSA of 7.3.    ICD-10-CM   1. Malignant neoplasm of prostate (HCC)  C61       HISTORY OF PRESENT ILLNESS: Roy [name] is a 53 y.o. male with a diagnosis of prostate cancer. He was previously referred to [facility] in 2018 for an elevated PSA of 4.28. This subsequently returned to WNL at 2.49 by 01/2018, prompting the patient to continue follow up with his PCP. More recently, he was noted to have an elevated PSA of 6.32 in 10/2021 and up to 7.35 on 12/12/2022, by his primary care physician, Dr. Milinda Cave.  Accordingly, he was referred back to [facility] for evaluation by Dr. Marlou Porch on 12/19/22,  digital rectal examination performed at that time showed a fairly thick prostate with some perirectal tenderness without significant induration.  The patient proceeded to transrectal ultrasound with 12 biopsies of the prostate on 03/03/23.  The prostate volume measured 38 cc.  Out of 12 core biopsies, 6 were positive.  The maximum Gleason score was 3+4, and this was seen in the right apex lateral, left apex, left mid, and left apex lateral. Additionally, Gleason 3+3 was seen in the left mid lateral (small focus) and right apex.  The patient reviewed the biopsy results with his urologist and he has kindly been referred today for discussion of potential radiation treatment options.   PREVIOUS RADIATION THERAPY: No  PAST MEDICAL HISTORY:  Past Medical History:  Diagnosis Date   Chronic rhinitis    Deviated nasal septum, hypertrophy of nasal turbinates: Dr. Ricardo Jericho turb reduct and  septoplasty.   Colitis    Diverticulosis 2009   Colonoscopy confirmed approx 2009 (diverticulitis x 2 episodes).   GERD (gastroesophageal reflux disease)    Glaucoma    History of adenomatous polyp of colon 12/11/2015;11/2018   Recall 11/2023.   Hypercholesterolemia 2018,2019,2020   Mild; Framingham CV risk<<7.5%.  TLC.  07/2019 I recommended he see advanced lipid clinic provider.   Irritable bowel syndrome with diarrhea    Left lumbar radiculopathy 2024   Dr. Katrinka Blazing and Dr. Marissa Nestle   Obesity, Class I, BMI 30-34.9    OSA on CPAP    Sleep study in remote past was normal per pt report.  OSA eval w/[place] 2018.  AHI 19/hr, desat to 81%.  Titration study 01/21/2017, optimal CPAP 6 cm H20.    Prostate cancer (HCC)    elev PSA since 2018.  Prostate bx + Jan 2025   Spondylolisthesis at L5-S1 level    Thrombocytopenia (HCC) 2014   Mild; stable 2014-2016.  Abd u/s 02/2014 NORMAL.      PAST SURGICAL HISTORY: Past Surgical History:  Procedure Laterality Date   ALIF--percutaneous stereotactic pedicle screw placement w/allograft (Dr. Asher Muir)  08/2020   BICEPS TENDON REPAIR     COLONOSCOPY W/ POLYPECTOMY  12/11/2015; 12/21/18   2017 Tubular adenoma x 1, +Diverticulosis and int hem.  Rpt 11/2018 with 1 sessile serrated poly, diverticulosis: recall 5 yrs.   EYE SURGERY     lasik   KNEE ARTHROSCOPY Bilateral    Bilat; 2003, 2004, 2010   LUMBAR DISC SURGERY  01/2018  NASAL SEPTOPLASTY W/ TURBINOPLASTY Bilateral 04/14/2015   Procedure: NASAL SEPTOPLASTY WITH BILATERAL INFERIOR  TURBINATE REDUCTION;  Surgeon: Osborn Coho, MD;  Location: [facility] OR;  Service: ENT;  Laterality: Bilateral;   PROSTATE BIOPSY  03/03/2023   +cancer   SHOULDER ARTHROSCOPY Bilateral    Bilat; 1993, 2000, 2007   WISDOM TOOTH EXTRACTION     young adult   WRIST FRACTURE SURGERY Left 2010   Hockey injury, plate inserted    FAMILY HISTORY:  Family History  Problem Relation Age of Onset   Pancreatic cancer  Paternal Grandfather    Lung cancer Paternal Grandfather         (smoker)   Colon cancer Maternal Grandmother    Colon polyps Father    Diverticulitis Mother    Esophageal cancer Neg Hx    Stomach cancer Neg Hx    Rectal cancer Neg Hx     SOCIAL HISTORY:  Social History   Socioeconomic History   Marital status: Married    Spouse name: Not on file   Number of children: 4   Years of education: Not on file   Highest education level: Not on file  Occupational History   Occupation: Art gallery manager  Tobacco Use   Smoking status: Never   Smokeless tobacco: Never  Vaping Use   Vaping status: Never Used  Substance and Sexual Activity   Alcohol use: Yes    Alcohol/week: 1.0 standard drink of alcohol    Types: 1 Cans of beer per week    Comment: beer occasional   Drug use: No   Sexual activity: Not on file  Other Topics Concern   Not on file  Social History Narrative   Married, 4 children from ages 60 yrs to 14 yrs.  No sibs.   Occupation: Doctor, hospital for [employer].   Grad from [employer] in [place], [place].   Originally from [place], lived in [place] for a while, then relocated to [facility] [employer] civil [place]) about 2010.   Exercises irregularly, no particular dietary focus.   No T/A/Ds.   Social Drivers of Corporate investment banker Strain: Low Risk  (08/24/2020)   Received from [facility], [facility]   Overall Financial Resource Strain (CARDIA)    Difficulty of Paying Living Expenses: Not hard at all  Food Insecurity: No Food Insecurity (03/21/2023)   Hunger Vital Sign    Worried About Running Out of Food in the Last Year: Never true    Ran Out of Food in the Last Year: Never true  Transportation Needs: No Transportation Needs (03/21/2023)   PRAPARE - Administrator, Civil Service (Medical): No    Lack of Transportation (Non-Medical): No  Physical Activity: Not on file  Stress: No Stress Concern Present (08/22/2020)   Received from [facility],  [facility] - Occupational Stress Questionnaire    Feeling of Stress : Only a little  Social Connections: Unknown (06/27/2021)   Received from [facility], [facility]   Social Network    Social Network: Not on file  Intimate Partner Violence: Not At Risk (03/21/2023)   Humiliation, Afraid, Rape, and Kick questionnaire    Fear of Current or Ex-Partner: No    Emotionally Abused: No    Physically Abused: No    Sexually Abused: No    ALLERGIES: Dayquil multi-symptom cold-flu [[id number]]  MEDICATIONS:  Current Outpatient Medications  Medication Sig Dispense Refill   brinzolamide (AZOPT) 1 % ophthalmic suspension 1  drop 2 (two) times daily.     latanoprost (XALATAN) 0.005 % ophthalmic solution Place 1 drop into both eyes at bedtime.     loratadine (CLARITIN) 10 MG tablet Take 10 mg by mouth daily.     omeprazole (PRILOSEC) 40 MG capsule Take 1 capsule (40 mg total) by mouth daily. 90 capsule 3   dorzolamide-timolol (COSOPT) 22.3-6.8 MG/ML ophthalmic solution 1 drop 2 (two) times daily. (Patient not taking: Reported on 03/21/2023)     No current facility-administered medications for this encounter.    REVIEW OF SYSTEMS:  On review of systems, the patient reports that he is doing well overall. He denies any chest pain, shortness of breath, cough, fevers, chills, night sweats, unintended weight changes. He denies any bowel disturbances, and denies abdominal pain, nausea or vomiting. He denies any new musculoskeletal or joint aches or pains. His IPSS was 20, indicating severe urinary symptoms with weak stream, intermittency, hesitancy, frequency and incomplete bladder emptying. His SHIM was 20, indicating he has mild erectile dysfunction. A complete review of systems is obtained and is otherwise negative.    PHYSICAL EXAM:  Wt Readings from Last 3 Encounters:  03/21/23 267 lb (121.1 kg)  03/12/23 268 lb (121.6 kg)  11/11/22 262 lb  (118.8 kg)   Temp Readings from Last 3 Encounters:  03/21/23 97.8 F (36.6 C)  11/09/21 98.2 F (36.8 C)  10/31/20 98.2 F (36.8 C)   BP Readings from Last 3 Encounters:  03/21/23 (!) 142/86  03/12/23 122/80  11/11/22 124/83   Pulse Readings from Last 3 Encounters:  03/21/23 66  03/12/23 66  11/11/22 62    /10  In general this is a well appearing Caucasian male in no acute distress. He's alert and oriented x4 and appropriate throughout the examination. Cardiopulmonary assessment is negative for acute distress, and he exhibits normal effort.     KPS = 100  100 - Normal; no complaints; no evidence of disease. 90   - Able to carry on normal activity; minor signs or symptoms of disease. 80   - Normal activity with effort; some signs or symptoms of disease. 9   - Cares for self; unable to carry on normal activity or to do active work. 60   - Requires occasional assistance, but is able to care for most of his personal needs. 50   - Requires considerable assistance and frequent medical care. 40   - Disabled; requires special care and assistance. 30   - Severely disabled; hospital admission is indicated although death not imminent. 20   - Very sick; hospital admission necessary; active supportive treatment necessary. 10   - Moribund; fatal processes progressing rapidly. 0     - Dead  Karnofsky DA, Abelmann WH, Craver LS and Burchenal [facility] [phone]) The use of the nitrogen mustards in the palliative treatment of carcinoma: with particular reference to bronchogenic carcinoma Cancer 1 634-56  LABORATORY DATA:  Lab Results  Component Value Date   WBC 4.2 11/09/2021   HGB 15.1 11/09/2021   HCT 43.6 11/09/2021   MCV 93.3 11/09/2021   PLT 139.0 (L) 11/09/2021   Lab Results  Component Value Date   NA 139 11/09/2021   K 4.4 11/09/2021   CL 105 11/09/2021   CO2 27 11/09/2021   Lab Results  Component Value Date   ALT 14 11/09/2021   AST 17 11/09/2021   ALKPHOS 95 11/09/2021    BILITOT 0.9 11/09/2021     RADIOGRAPHY: No results found.  IMPRESSION/PLAN: 1. 53 y.o. gentleman with Stage T1c adenocarcinoma of the prostate with Gleason Score of 3+4, and PSA of 7.3. We discussed the patient's workup and outlined the nature of prostate cancer in this setting. The patient's T stage, Gleason's score, and PSA put him into the favorable intermediate risk group. Accordingly, he is eligible for a variety of potential treatment options including active surveillance, brachytherapy, 5.5 weeks of external radiation, or prostatectomy. We discussed the available radiation techniques, and focused on the details and logistics of delivery. We discussed and outlined the risks, benefits, short and long-term effects associated with radiotherapy and compared and contrasted these with prostatectomy. We discussed the role of SpaceOAR gel in reducing the rectal toxicity associated with radiotherapy.    The patient focused most of his questions and interest in robotic-assisted laparoscopic radical prostatectomy.  We discussed some of the potential advantages of surgery including surgical staging, the availability of salvage radiotherapy to the prostatic fossa, and the confidence associated with immediate biochemical response.  We discussed some of the potential proven indications for postoperative radiotherapy including positive margins, extracapsular extension, and seminal vesicle involvement. We also talked about some of the other potential findings leading to a recommendation for radiotherapy including a non-zero postoperative PSA and positive lymph nodes.  He appears to have a good understanding of his disease and our treatment recommendations which are of curative intent.  He was encouraged to ask questions that were answered to his stated satisfaction.  At the conclusion of our conversation, the patient is not fully decided but appears to be most interested in moving forward with RALP.  We will  share our discussion with Dr. Marlou Porch so that he can move forward with scheduling the procedure.  The patient has our contact information and knows that he is welcome to call at anytime with any questions or concerns related to our treatment discussion today.  We enjoyed meeting with him and look forward to following along in his care.  We personally spent 70 minutes in this encounter including chart review, reviewing radiological studies, meeting face-to-face with the patient, entering orders and completing documentation.    Marguarite Arbour, PA-C    Margaretmary Dys, MD  [facility]  Radiation Oncology Direct Dial: [phone]  Fax: [phone] [email or web address]  Skype  LinkedIn   This document serves as a record of services personally performed by Margaretmary Dys, MD and Marcello Fennel, PA-C. It was created on their behalf by Mickie Bail, a trained medical scribe. The creation of this record is based on the scribe's personal observations and the provider's statements to them. This document has been checked and approved by the attending provider.

## 2023-03-27 ENCOUNTER — Other Ambulatory Visit: Payer: Self-pay | Admitting: Urology

## 2023-03-28 NOTE — Progress Notes (Signed)
Introduced myself to the patient as the prostate nurse navigator.  He is here to discuss his radiation treatment options and did confirm that he would like to proceed with surgery.  I gave him my business card and asked him to call me with questions or concerns.  Verbalized understanding.

## 2023-04-07 ENCOUNTER — Telehealth: Payer: Self-pay

## 2023-04-07 NOTE — Transitions of Care (Post Inpatient/ED Visit) (Signed)
   04/07/2023  Name: Hedley Student MRN: [id number] DOB: 08/09/1970  Today's TOC FU Call Status: Today's TOC FU Call Status:: Successful TOC FU Call Completed TOC FU Call Complete Date: 04/07/23 Patient's Name and Date of Birth confirmed.  Transition Care Management Follow-up Telephone Call Date of Discharge: 04/05/23 Discharge Facility: Other ([facility] Facility) Name of Other ([facility]) [facility]charge Facility: [facility] Type of Discharge: Emergency Department Reason for ED Visit: Other: How have you been since you were released from the hospital?: Better Any questions or concerns?: No  Items Reviewed: Did you receive and understand the discharge instructions provided?: Yes Medications obtained,verified, and reconciled?: Yes (Medications Reviewed) Any new allergies since your discharge?: No Dietary orders reviewed?: NA Do you have support at home?: Yes People in Home: spouse  Medications Reviewed Today: Medications Reviewed Today   Medications were not reviewed in this encounter     Home Care and Equipment/Supplies: Were Home Health Services Ordered?: NA Any new equipment or medical supplies ordered?: NA  Functional Questionnaire: Do you need assistance with bathing/showering or dressing?: No Do you need assistance with meal preparation?: No Do you need assistance with eating?: No Do you have difficulty maintaining continence: No Do you need assistance with getting out of bed/getting out of a chair/moving?: No Do you have difficulty managing or taking your medications?: No  Follow up appointments reviewed: PCP Follow-up appointment confirmed?: NA Specialist Hospital Follow-up appointment confirmed?: No Reason Specialist Follow-Up Not Confirmed: Patient has Specialist Provider Number and will Call for Appointment Do you need transportation to your follow-up appointment?: No Do you understand care options if your condition(s)  worsen?: Yes-patient verbalized understanding    SIGNATURE [place]

## 2023-04-21 ENCOUNTER — Ambulatory Visit (AMBULATORY_SURGERY_CENTER): Payer: 59

## 2023-04-21 VITALS — Ht 73.0 in | Wt 255.0 lb

## 2023-04-21 DIAGNOSIS — Z860100 Personal history of colon polyps, unspecified: Secondary | ICD-10-CM

## 2023-04-21 MED ORDER — SUFLAVE 178.7 G PO SOLR
1.0000 | Freq: Once | ORAL | 0 refills | Status: AC
Start: 2023-04-21 — End: 2023-04-21

## 2023-04-21 NOTE — Progress Notes (Signed)

## 2023-04-30 ENCOUNTER — Encounter: Payer: Self-pay | Admitting: Internal Medicine

## 2023-05-05 ENCOUNTER — Other Ambulatory Visit: Payer: Self-pay

## 2023-05-05 ENCOUNTER — Encounter: Payer: Self-pay | Admitting: Internal Medicine

## 2023-05-05 ENCOUNTER — Telehealth: Payer: Self-pay | Admitting: Internal Medicine

## 2023-05-05 ENCOUNTER — Ambulatory Visit (AMBULATORY_SURGERY_CENTER): Payer: 59 | Admitting: Internal Medicine

## 2023-05-05 VITALS — BP 119/83 | HR 71 | Temp 98.1°F | Resp 11 | Ht 74.0 in | Wt 255.0 lb

## 2023-05-05 DIAGNOSIS — D125 Benign neoplasm of sigmoid colon: Secondary | ICD-10-CM

## 2023-05-05 DIAGNOSIS — Z860101 Personal history of adenomatous and serrated colon polyps: Secondary | ICD-10-CM | POA: Diagnosis not present

## 2023-05-05 DIAGNOSIS — K5730 Diverticulosis of large intestine without perforation or abscess without bleeding: Secondary | ICD-10-CM | POA: Diagnosis not present

## 2023-05-05 DIAGNOSIS — Z860100 Personal history of colon polyps, unspecified: Secondary | ICD-10-CM

## 2023-05-05 DIAGNOSIS — Z1211 Encounter for screening for malignant neoplasm of colon: Secondary | ICD-10-CM | POA: Diagnosis present

## 2023-05-05 DIAGNOSIS — K648 Other hemorrhoids: Secondary | ICD-10-CM | POA: Diagnosis not present

## 2023-05-05 DIAGNOSIS — K219 Gastro-esophageal reflux disease without esophagitis: Secondary | ICD-10-CM

## 2023-05-05 MED ORDER — SODIUM CHLORIDE 0.9 % IV SOLN
500.0000 mL | Freq: Once | INTRAVENOUS | Status: DC
Start: 2023-05-05 — End: 2023-05-05

## 2023-05-05 NOTE — Progress Notes (Signed)
 Called to room to assist during endoscopic procedure.  Patient ID and intended procedure confirmed with present staff. Received instructions for my participation in the procedure from the performing physician.

## 2023-05-05 NOTE — Telephone Encounter (Signed)
 Spoke with patient and offered him the appt for 06/06/23. Pt states he has several more surgeries to get done prior to having egd done. Pt instructed to call the office back when he is ready to schedule the egd. Pt verbalized understanding and Dr. Rhea Belton notified.

## 2023-05-05 NOTE — Patient Instructions (Signed)

## 2023-05-05 NOTE — Telephone Encounter (Signed)
 Pt scheduled for EGD in the [facility] 06/06/23 at 9am. Amb ref in epic. Prep instructions mailed to pt. And sent via mychart.

## 2023-05-05 NOTE — Op Note (Signed)
 [facility] Patient Name: Roy [name] Procedure Date: 05/05/2023 8:00 AM MRN: [id number] Endoscopist: Beverley Fiedler , MD, [id number] Age: 53 Referring MD:  Date of Birth: 11/26/1970 Gender: Male Account #: [id number] Procedure:                Colonoscopy Indications:              High risk colon cancer surveillance: Personal                            history of sessile serrated colon polyp (less than                            10 mm in size) with no dysplasia and adenomas, Last                            colonoscopy: October 2020 (SSP x 2), Oct 2017 (TA x                            3) Medicines:                Monitored Anesthesia Care Procedure:                Pre-Anesthesia Assessment:                           - Prior to the procedure, a History and Physical                            was performed, and patient medications and                            allergies were reviewed. The patient's tolerance of                            previous anesthesia was also reviewed. The risks                            and benefits of the procedure and the sedation                            options and risks were discussed with the patient.                            All questions were answered, and informed consent                            was obtained. Prior Anticoagulants: The patient has                            taken no anticoagulant or antiplatelet agents. ASA                            Grade Assessment: II - A patient with mild systemic  disease. After reviewing the risks and benefits,                            the patient was deemed in satisfactory condition to                            undergo the procedure.                           After obtaining informed consent, the colonoscope                            was passed under direct vision. Throughout the                            procedure, the patient's blood pressure, pulse, and                             oxygen saturations were monitored continuously. The                            Olympus CF-HQ190L ([id number]) Colonoscope was                            introduced through the anus and advanced to the                            cecum, identified by appendiceal orifice and                            ileocecal valve. The colonoscopy was performed                            without difficulty. The patient tolerated the                            procedure well. The quality of the bowel                            preparation was good. The ileocecal valve,                            appendiceal orifice, and rectum were photographed. Scope In: 8:19:13 AM Scope Out: 8:31:41 AM Scope Withdrawal Time: 0 hours 10 minutes 15 seconds  Total Procedure Duration: 0 hours 12 minutes 28 seconds  Findings:                 The digital rectal exam was normal.                           A 5 mm polyp was found in the sigmoid colon. The                            polyp was sessile. The polyp was removed with a  cold snare. Resection and retrieval were complete.                           Multiple medium-mouthed and small-mouthed                            diverticula were found in the sigmoid colon,                            descending colon and cecum.                           Internal hemorrhoids were found during                            retroflexion. The hemorrhoids were small. Complications:            No immediate complications. Estimated Blood Loss:     Estimated blood loss: none. Impression:               - One 5 mm polyp in the sigmoid colon, removed with                            a cold snare. Resected and retrieved.                           - Moderate diverticulosis in the sigmoid colon, in                            the descending colon and in the cecum.                           - Internal hemorrhoids. Recommendation:           - Patient has a  contact number available for                            emergencies. The signs and symptoms of potential                            delayed complications were discussed with the                            patient. Return to normal activities tomorrow.                            Written discharge instructions were provided to the                            patient.                           - Resume previous diet.                           - Continue present medications.                           -  Await pathology results.                           - Repeat colonoscopy is recommended for                            surveillance in 5 years. The colonoscopy date will                            be determined after pathology results from today's                            exam become available for review. Beverley Fiedler, MD 05/05/2023 8:34:37 AM This report has been signed electronically.

## 2023-05-05 NOTE — Telephone Encounter (Signed)
 Patient seen for colonoscopy this morning  Prior to sedation he inquired about the possibility of an upper endoscopy for screening.  He has a history of acid reflux and he has been on omeprazole for greater than 10 years.  With this medication his reflux is controlled, he is not having dysphagia or odynophagia, no abdominal pain.  No weight loss.  With this knowledge it is very reasonable to perform upper endoscopy for GERD and Barrett's screening  For now he is advised to continue omeprazole at current dose  Please arrange EGD in the [facility] for the above indication

## 2023-05-05 NOTE — Progress Notes (Signed)
 GASTROENTEROLOGY PROCEDURE H&P NOTE   Primary Care Physician: Jeoffrey Massed, MD    Reason for Procedure:   Hx of polyps  Plan:    colonoscopy  Patient is appropriate for endoscopic procedure(s) in the ambulatory ([facility]) setting.  The nature of the procedure, as well as the risks, benefits, and alternatives were carefully and thoroughly reviewed with the patient. Ample time for discussion and questions allowed. The patient understood, was satisfied, and agreed to proceed.     HPI: Roy [name] is a 53 y.o. male who presents for colonoscopy.  Medical history as below.  Tolerated the prep.  No recent chest pain or shortness of breath.  No abdominal pain today.  Past Medical History:  Diagnosis Date   Chronic rhinitis    Deviated nasal septum, hypertrophy of nasal turbinates: Dr. Ricardo Jericho turb reduct and septoplasty.   Colitis    Diverticulosis 2009   Colonoscopy confirmed approx 2009 (diverticulitis x 2 episodes).   GERD (gastroesophageal reflux disease)    Glaucoma    History of adenomatous polyp of colon 12/11/2015;11/2018   Recall 11/2023.   Hypercholesterolemia 2018,2019,2020   Mild; Framingham CV risk<<7.5%.  TLC.  07/2019 I recommended he see advanced lipid clinic provider.   Irritable bowel syndrome with diarrhea    Left lumbar radiculopathy 2024   Dr. Katrinka Blazing and Dr. Marissa Nestle   Obesity, Class I, BMI 30-34.9    OSA on CPAP    Sleep study in remote past was normal per pt report.  OSA eval w/[place] 2018.  AHI 19/hr, desat to 81%.  Titration study 01/21/2017, optimal CPAP 6 cm H20.    Prostate cancer (HCC)    elev PSA since 2018.  Prostate bx + Jan 2025   Spondylolisthesis at L5-S1 level    Thrombocytopenia (HCC) 2014   Mild; stable 2014-2016.  Abd u/s 02/2014 NORMAL.    Past Surgical History:  Procedure Laterality Date   ALIF--percutaneous stereotactic pedicle screw placement w/allograft (Dr. Asher Muir)  08/2020   BICEPS TENDON REPAIR      COLONOSCOPY W/ POLYPECTOMY  12/11/2015; 12/21/18   2017 Tubular adenoma x 1, +Diverticulosis and int hem.  Rpt 11/2018 with 1 sessile serrated poly, diverticulosis: recall 5 yrs.   EYE SURGERY     lasik   KNEE ARTHROSCOPY Bilateral    Bilat; 2003, 2004, 2010   LUMBAR DISC SURGERY  01/2018   NASAL SEPTOPLASTY W/ TURBINOPLASTY Bilateral 04/14/2015   Procedure: NASAL SEPTOPLASTY WITH BILATERAL INFERIOR  TURBINATE REDUCTION;  Surgeon: Osborn Coho, MD;  Location[facility] OR;  Service: ENT;  Laterality: Bilateral;   PROSTATE BIOPSY  03/03/2023   +cancer   SHOULDER ARTHROSCOPY Bilateral    Bilat; 1993, 2000, 2007   WISDOM TOOTH EXTRACTION     young adult   WRIST FRACTURE SURGERY Left 2010   Hockey injury, plate inserted    Prior to Admission medications   Medication Sig Start Date End Date Taking? Authorizing Provider  brinzolamide (AZOPT) 1 % ophthalmic suspension 1 drop 2 (two) times daily. 03/08/23  Yes [provider]  latanoprost (XALATAN) 0.005 % ophthalmic solution Place 1 drop into both eyes at bedtime.   Yes [provider]  loratadine (CLARITIN) 10 MG tablet Take 10 mg by mouth daily.   Yes [provider]  omeprazole (PRILOSEC) 40 MG capsule Take 1 capsule (40 mg total) by mouth daily. 11/11/22  Yes [name], Maryjean Morn, MD  prednisoLONE acetate (PRED FORTE) 1 % ophthalmic suspension Place 1 drop into the right  eye every 4 (four) hours. 04/12/23  Yes [provider]  dorzolamide-timolol (COSOPT) 22.3-6.8 MG/ML ophthalmic solution 1 drop 2 (two) times daily. Patient not taking: Reported on 03/21/2023 10/26/21   [provider]    Current Outpatient Medications  Medication Sig Dispense Refill   brinzolamide (AZOPT) 1 % ophthalmic suspension 1 drop 2 (two) times daily.     latanoprost (XALATAN) 0.005 % ophthalmic solution Place 1 drop into both eyes at bedtime.     loratadine (CLARITIN) 10 MG tablet Take 10 mg by mouth daily.     omeprazole  (PRILOSEC) 40 MG capsule Take 1 capsule (40 mg total) by mouth daily. 90 capsule 3   prednisoLONE acetate (PRED FORTE) 1 % ophthalmic suspension Place 1 drop into the right eye every 4 (four) hours.     dorzolamide-timolol (COSOPT) 22.3-6.8 MG/ML ophthalmic solution 1 drop 2 (two) times daily. (Patient not taking: Reported on 03/21/2023)     Current Facility-Administered Medications  Medication Dose Route Frequency Provider Last Rate Last Admin   0.9 %  sodium chloride infusion  500 mL Intravenous Once Anahid Eskelson, Carie Caddy, MD        Allergies as of 05/05/2023 - Review Complete 05/05/2023  Allergen Reaction Noted   Dayquil multi-symptom cold-flu [pseudoephedrine-apap-dm] Hives 11/26/2010    Family History  Problem Relation Age of Onset   Diverticulitis Mother    Colon polyps Father    Stomach cancer Paternal Aunt    Colon cancer Maternal Grandmother    Pancreatic cancer Paternal Grandfather    Lung cancer Paternal Grandfather         (smoker)   Esophageal cancer Neg Hx    Rectal cancer Neg Hx     Social History   Socioeconomic History   Marital status: Married    Spouse name: Not on file   Number of children: 4   Years of education: Not on file   Highest education level: Not on file  Occupational History   Occupation: Art gallery manager  Tobacco Use   Smoking status: Never   Smokeless tobacco: Never  Vaping Use   Vaping status: Never Used  Substance and Sexual Activity   Alcohol use: Yes    Alcohol/week: 1.0 standard drink of alcohol    Types: 1 Cans of beer per week    Comment: beer occasional   Drug use: No   Sexual activity: Not on file  Other Topics Concern   Not on file  Social History Narrative   Married, 4 children from ages 74 yrs to 14 yrs.  No sibs.   Occupation: Doctor, hospital for [employer].   Grad from [employer] in [place], [place].   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   Exercises  irregularly, no particular dietary focus.   No T/A/Ds.   Social Drivers of Corporate investment banker Strain: Low Risk  (08/24/2020)   Received from [facility]   Overall Financial Resource Strain (CARDIA)    Difficulty of Paying Living Expenses: Not hard at all  Food Insecurity: No Food Insecurity (03/21/2023)   Hunger Vital Sign    Worried About Running Out of Food in the Last Year: Never true    Ran Out of Food in the Last Year: Never true  Transportation Needs: No Transportation Needs (03/21/2023)   PRAPARE - Administrator, Civil Service (Medical): No    Lack of Transportation (Non-Medical): No  Physical  Activity: Not on file  Stress: No Stress Concern Present (08/22/2020)   Received from [facility], [facility] - Occupational Stress Questionnaire    Feeling of Stress : Only a little  Social Connections: Unknown (06/27/2021)   Received from [facility], [facility]   Social Network    Social Network: Not on file  Intimate Partner Violence: Not At Risk (03/21/2023)   Humiliation, Afraid, Rape, and Kick questionnaire    Fear of Current or Ex-Partner: No    Emotionally Abused: No    Physically Abused: No    Sexually Abused: No    Physical Exam: Vital signs in last 24 hours: @BP  117/86   Pulse 65   Temp 98.1 F (36.7 C) (Skin)   Resp 10   Ht 6\' 2"  (1.88 m)   Wt 255 lb (115.7 kg)   SpO2 99%   BMI 32.74 kg/m  GEN: NAD EYE: Sclerae anicteric ENT: MMM CV: Non-tachycardic Pulm: CTA b/l GI: Soft, NT/ND NEURO:  Alert & Oriented x 3   Erick Blinks, MD [facility]  05/05/2023 8:15 AM

## 2023-05-05 NOTE — Progress Notes (Signed)
 Pt's states no medical or surgical changes since previsit or office visit.

## 2023-05-05 NOTE — Progress Notes (Signed)
 To pacu, VSS. Report to [email or web address]

## 2023-05-06 ENCOUNTER — Telehealth: Payer: Self-pay

## 2023-05-06 NOTE — Telephone Encounter (Signed)
  Follow up Call-     05/05/2023    7:15 AM  Call back number  Post procedure Call Back phone  # [phone]  Permission to leave phone message Yes     Patient questions:  Do you have a fever, pain , or abdominal swelling? No. Pain Score  0 *  Have you tolerated food without any problems? Yes.    Have you been able to return to your normal activities? Yes.    Do you have any questions about your discharge instructions: Diet   No. Medications  No. Follow up visit  No.  Do you have questions or concerns about your Care? No.  Actions: * If pain score is 4 or above: No action needed, pain <4.

## 2023-05-07 LAB — SURGICAL PATHOLOGY

## 2023-05-12 ENCOUNTER — Encounter: Payer: Self-pay | Admitting: Internal Medicine

## 2023-05-26 NOTE — Progress Notes (Signed)
 COVID Vaccine received:  []  No [x]  Yes Date of any COVID positive Test in last 90 days: no PCP - Nicoletta Ba MD Cardiologist - n/a  Chest x-ray -  EKG -   Stress Test -  ECHO -  Cardiac Cath -   Bowel Prep - [x]  No  []   Yes ______  Pacemaker / ICD device [x]  No []  Yes   Spinal Cord Stimulator:[x]  No []  Yes       History of Sleep Apnea? []  No [x]  Yes   CPAP used?- []  No [x]  Yes    Does the patient monitor blood sugar?          [x]  No []  Yes  []  N/A  Patient has: [x]  NO Hx DM   []  Pre-DM                 []  DM1  []   DM2 Does patient have a [employer] or Dexacom? []  No []  Yes   Fasting Blood Sugar Ranges-  Checks Blood Sugar _____ times a day  GLP1 agonist / usual dose - no GLP1 instructions:  SGLT-2 inhibitors / usual dose - no SGLT-2 instructions:   Blood Thinner / Instructions:no Aspirin Instructions:no  Comments:   Activity level: Patient is able  to climb a flight of stairs without difficulty; [x]  No CP  []  No SOB,    Patient can perform ADLs without assistance.   Anesthesia review:   Patient denies shortness of breath, fever, cough and chest pain at PAT appointment.  Patient verbalized understanding and agreement to the Pre-Surgical Instructions that were given to them at this PAT appointment. Patient was also educated of the need to review these PAT instructions again prior to his/her surgery.I reviewed the appropriate phone numbers to call if they have any and questions or concerns.

## 2023-05-26 NOTE — Patient Instructions (Signed)
 SURGICAL WAITING ROOM VISITATION  Patients having surgery or a procedure may have no more than 2 support people in the waiting area - these visitors may rotate.    Children under the age of 29 must have an adult with them who is not the patient.  Due to an increase in RSV and influenza rates and associated hospitalizations, children ages 40 and under may not visit patients in [facility] hospitals.  Visitors with respiratory illnesses are discouraged from visiting and should remain at home.  If the patient needs to stay at the hospital during part of their recovery, the visitor guidelines for inpatient rooms apply. Pre-op nurse will coordinate an appropriate time for 1 support person to accompany patient in pre-op.  This support person may not rotate.    Please refer to the [facility] website for the visitor guidelines for Inpatients (after your surgery is over and you are in a regular room).       Your procedure is scheduled on: 06/05/23   Report to [facility] Main Entrance    Report to admitting at 5:15 AM   Call this number if you have problems the morning of surgery [phone]   Do not eat food  or drink liquids:After Midnight .but may have sips of water with meds.                                       FOLLOW BOWEL PREP AND ANY ADDITIONAL PRE OP INSTRUCTIONS YOU RECEIVED FROM YOUR SURGEON'S OFFICE!!!     Oral Hygiene is also important to reduce your risk of infection.                                    Remember - BRUSH YOUR TEETH THE MORNING OF SURGERY WITH YOUR REGULAR TOOTHPASTE   Stop all vitamins and herbal supplements 7 days before surgery.   Take these medicines the morning of surgery with A SIP OF WATER: omeprazole, eye drops  Bring CPAP mask and tubing day of surgery.                              You may not have any metal on your body including hair pins, jewelry, and body piercing             Do not wear make-up, lotions, powders, perfumes/cologne,  or deodorant              Men may shave face and neck.   Do not bring valuables to the hospital. [facility] IS NOT             RESPONSIBLE   FOR VALUABLES.   Contacts, glasses, dentures or bridgework may not be worn into surgery.   Bring small overnight bag day of surgery.   DO NOT BRING YOUR HOME MEDICATIONS TO THE HOSPITAL. PHARMACY WILL DISPENSE MEDICATIONS LISTED ON YOUR MEDICATION LIST TO YOU DURING YOUR ADMISSION IN THE HOSPITAL!    Patients discharged on the day of surgery will not be allowed to drive home.  Someone NEEDS to stay with you for the first 24 hours after anesthesia.   Special Instructions: Bring a copy of your healthcare power of attorney and living will documents the day of surgery if you haven't scanned them  before.              Please read over the following fact sheets you were given: IF YOU HAVE QUESTIONS ABOUT YOUR PRE-OP INSTRUCTIONS PLEASE CALL [phone] [place] [place]   If you received a COVID test during your pre-op visit  it is requested that you wear a mask when out in public, stay away from anyone that may not be feeling well and notify your surgeon if you develop symptoms. If you test positive for Covid or have been in contact with anyone that has tested positive in the last 10 days please notify you surgeon.    [place] - Preparing for Surgery Before surgery, you can play an important role.  Because skin is not sterile, your skin needs to be as free of germs as possible.  You can reduce the number of germs on your skin by washing with CHG (chlorahexidine gluconate) soap before surgery.  CHG is an antiseptic cleaner which kills germs and bonds with the skin to continue killing germs even after washing. Please DO NOT use if you have an allergy to CHG or antibacterial soaps.  If your skin becomes reddened/irritated stop using the CHG and inform your nurse when you arrive at Short Stay. Do not shave (including legs and underarms) for at least 48 hours prior to  the first CHG shower.  You may shave your face/neck.  Please follow these instructions carefully:  1.  Shower with CHG Soap the night before surgery and the  morning of surgery.  2.  If you choose to wash your hair, wash your hair first as usual with your normal  shampoo.  3.  After you shampoo, rinse your hair and body thoroughly to remove the shampoo.                             4.  Use CHG as you would any other liquid soap.  You can apply chg directly to the skin and wash.  Gently with a scrungie or clean washcloth.  5.  Apply the CHG Soap to your body ONLY FROM THE NECK DOWN.   Do   not use on face/ open                           Wound or open sores. Avoid contact with eyes, ears mouth and   genitals (private parts).                       Wash face,  Genitals (private parts) with your normal soap.             6.  Wash thoroughly, paying special attention to the area where your    surgery  will be performed.  7.  Thoroughly rinse your body with warm water from the neck down.  8.  DO NOT shower/wash with your normal soap after using and rinsing off the CHG Soap.                9.  Pat yourself dry with a clean towel.            10.  Wear clean pajamas.            11.  Place clean sheets on your bed the night of your first shower and do not  sleep with pets. Day of Surgery : Do not apply any  lotions/deodorants the morning of surgery.  Please wear clean clothes to the hospital/surgery center.  FAILURE TO FOLLOW THESE INSTRUCTIONS MAY RESULT IN THE CANCELLATION OF YOUR SURGERY  PATIENT SIGNATURE_________________________________  NURSE SIGNATURE__________________________________  ________________________________________________________________________ WHAT IS A BLOOD TRANSFUSION? Blood Transfusion Information  A transfusion is the replacement of blood or some of its parts. Blood is made up of multiple cells which provide different functions. Red blood cells carry oxygen and are used for  blood loss replacement. White blood cells fight against infection. Platelets control bleeding. Plasma helps clot blood. Other blood products are available for specialized needs, such as hemophilia or other clotting disorders. BEFORE THE TRANSFUSION  Who gives blood for transfusions?  Healthy volunteers who are fully evaluated to make sure their blood is safe. This is blood bank blood. Transfusion therapy is the safest it has ever been in the practice of medicine. Before blood is taken from a donor, a complete history is taken to make sure that person has no history of diseases nor engages in risky social behavior (examples are intravenous drug use or sexual activity with multiple partners). The donor's travel history is screened to minimize risk of transmitting infections, such as malaria. The donated blood is tested for signs of infectious diseases, such as HIV and hepatitis. The blood is then tested to be sure it is compatible with you in order to minimize the chance of a transfusion reaction. If you or a relative donates blood, this is often done in anticipation of surgery and is not appropriate for emergency situations. It takes many days to process the donated blood. RISKS AND COMPLICATIONS Although transfusion therapy is very safe and saves many lives, the main dangers of transfusion include:  Getting an infectious disease. Developing a transfusion reaction. This is an allergic reaction to something in the blood you were given. Every precaution is taken to prevent this. The decision to have a blood transfusion has been considered carefully by your caregiver before blood is given. Blood is not given unless the benefits outweigh the risks. AFTER THE TRANSFUSION Right after receiving a blood transfusion, you will usually feel much better and more energetic. This is especially true if your red blood cells have gotten low (anemic). The transfusion raises the level of the red blood cells which carry  oxygen, and this usually causes an energy increase. The nurse administering the transfusion will monitor you carefully for complications. HOME CARE INSTRUCTIONS  No special instructions are needed after a transfusion. You may find your energy is better. Speak with your caregiver about any limitations on activity for underlying diseases you may have. SEEK MEDICAL CARE IF:  Your condition is not improving after your transfusion. You develop redness or irritation at the intravenous (IV) site. SEEK IMMEDIATE MEDICAL CARE IF:  Any of the following symptoms occur over the next 12 hours: Shaking chills. You have a temperature by mouth above 102 F (38.9 C), not controlled by medicine. Chest, back, or muscle pain. People around you feel you are not acting correctly or are confused. Shortness of breath or difficulty breathing. Dizziness and fainting. You get a rash or develop hives. You have a decrease in urine output. Your urine turns a dark color or changes to pink, red, or brown. Any of the following symptoms occur over the next 10 days: You have a temperature by mouth above 102 F (38.9 C), not controlled by medicine. Shortness of breath. Weakness after normal activity. The white part of the eye turns yellow (jaundice). You  have a decrease in the amount of urine or are urinating less often. Your urine turns a dark color or changes to pink, red, or brown. Document Released: 02/09/2000 Document Revised: 05/06/2011 Document Reviewed: 09/28/2007 [facility] Patient Information 2014 [place], [place].

## 2023-05-27 ENCOUNTER — Encounter (HOSPITAL_COMMUNITY): Payer: Self-pay

## 2023-05-27 ENCOUNTER — Other Ambulatory Visit: Payer: Self-pay

## 2023-05-27 ENCOUNTER — Encounter (HOSPITAL_COMMUNITY)
Admission: RE | Admit: 2023-05-27 | Discharge: 2023-05-27 | Disposition: A | Discharge status: Home / Self Care | Source: Ambulatory Visit | Attending: Urology | Admitting: Urology

## 2023-05-27 DIAGNOSIS — Z01812 Encounter for preprocedural laboratory examination: Secondary | ICD-10-CM | POA: Insufficient documentation

## 2023-05-27 LAB — CBC
HCT: 46.3 % (ref 39.0–52.0)
Hemoglobin: 16 g/dL (ref 13.0–17.0)
MCH: 32.2 pg (ref 26.0–34.0)
MCHC: 34.6 g/dL (ref 30.0–36.0)
MCV: 93.2 fL (ref 80.0–100.0)
Platelets: 156 10*3/uL (ref 150–400)
RBC: 4.97 MIL/uL (ref 4.22–5.81)
RDW: 12.5 % (ref 11.5–15.5)
WBC: 5.7 10*3/uL (ref 4.0–10.5)
nRBC: 0 % (ref 0.0–0.2)

## 2023-05-27 LAB — COMPREHENSIVE METABOLIC PANEL WITH GFR
ALT: 18 U/L (ref 0–44)
AST: 16 U/L (ref 15–41)
Albumin: 4.2 g/dL (ref 3.5–5.0)
Alkaline Phosphatase: 88 U/L (ref 38–126)
Anion gap: 8 (ref 5–15)
BUN: 25 mg/dL — ABNORMAL HIGH (ref 6–20)
CO2: 24 mmol/L (ref 22–32)
Calcium: 9.1 mg/dL (ref 8.9–10.3)
Chloride: 104 mmol/L (ref 98–111)
Creatinine, Ser: 1.07 mg/dL (ref 0.61–1.24)
GFR, Estimated: >60 mL/min
Glucose, Bld: 103 mg/dL — ABNORMAL HIGH (ref 70–99)
Potassium: 4.3 mmol/L (ref 3.5–5.1)
Sodium: 136 mmol/L (ref 135–145)
Total Bilirubin: 0.7 mg/dL (ref 0.0–1.2)
Total Protein: 7.7 g/dL (ref 6.5–8.1)

## 2023-06-02 ENCOUNTER — Encounter: Payer: Self-pay | Admitting: Internal Medicine

## 2023-06-04 NOTE — Anesthesia Preprocedure Evaluation (Signed)
 Anesthesia Evaluation  Patient identified by MRN, date of birth, ID band Patient awake    Reviewed: Allergy & Precautions, NPO status , Patient's Chart, lab work & pertinent test results  History of Anesthesia Complications Negative for: history of anesthetic complications  Airway Mallampati: II  TM Distance: >3 FB Neck ROM: Full    Dental  (+) Dental Advisory Given, Teeth Intact   Pulmonary sleep apnea and Continuous Positive Airway Pressure Ventilation    breath sounds clear to auscultation       Cardiovascular negative cardio ROS  Rhythm:Regular Rate:Normal     Neuro/Psych glaucoma    GI/Hepatic Neg liver ROS,GERD  Medicated and Controlled,,  Endo/Other  BMI 33.6  Renal/GU negative Renal ROS   Prostate cancer    Musculoskeletal   Abdominal   Peds  Hematology negative hematology ROS (+)   Anesthesia Other Findings   Reproductive/Obstetrics                             Anesthesia Physical Anesthesia Plan  ASA: 2  Anesthesia Plan: General   Post-op Pain Management: Tylenol PO (pre-op)*   Induction: Intravenous  PONV Risk Score and Plan: 2 and Dexamethasone  Airway Management Planned: Oral ETT  Additional Equipment: None  Intra-op Plan:   Post-operative Plan: Extubation in OR  Informed Consent: I have reviewed the patients History and Physical, chart, labs and discussed the procedure including the risks, benefits and alternatives for the proposed anesthesia with the patient or authorized representative who has indicated his/her understanding and acceptance.     Dental advisory given  Plan Discussed with: CRNA and Surgeon  Anesthesia Plan Comments:         Anesthesia Quick Evaluation

## 2023-06-05 ENCOUNTER — Encounter (HOSPITAL_COMMUNITY): Payer: Self-pay | Admitting: Urology

## 2023-06-05 ENCOUNTER — Ambulatory Visit (HOSPITAL_COMMUNITY)
Admission: RE | Admit: 2023-06-05 | Discharge: 2023-06-06 | Disposition: A | Discharge status: Home / Self Care | Payer: 59 | Source: Ambulatory Visit | Attending: Urology | Admitting: Urology

## 2023-06-05 ENCOUNTER — Other Ambulatory Visit: Payer: Self-pay

## 2023-06-05 ENCOUNTER — Encounter (HOSPITAL_COMMUNITY)
Admission: RE | Disposition: A | Discharge status: Home / Self Care | Payer: Self-pay | Source: Ambulatory Visit | Attending: Urology

## 2023-06-05 ENCOUNTER — Ambulatory Visit (HOSPITAL_BASED_OUTPATIENT_CLINIC_OR_DEPARTMENT_OTHER): Admitting: Anesthesiology

## 2023-06-05 ENCOUNTER — Ambulatory Visit (HOSPITAL_COMMUNITY): Admitting: Anesthesiology

## 2023-06-05 DIAGNOSIS — G4733 Obstructive sleep apnea (adult) (pediatric): Secondary | ICD-10-CM | POA: Insufficient documentation

## 2023-06-05 DIAGNOSIS — Z79899 Other long term (current) drug therapy: Secondary | ICD-10-CM | POA: Insufficient documentation

## 2023-06-05 DIAGNOSIS — K219 Gastro-esophageal reflux disease without esophagitis: Secondary | ICD-10-CM | POA: Insufficient documentation

## 2023-06-05 DIAGNOSIS — C61 Malignant neoplasm of prostate: Secondary | ICD-10-CM | POA: Diagnosis present

## 2023-06-05 DIAGNOSIS — H409 Unspecified glaucoma: Secondary | ICD-10-CM | POA: Diagnosis not present

## 2023-06-05 HISTORY — PX: ROBOT ASSISTED LAPAROSCOPIC RADICAL PROSTATECTOMY: SHX5141

## 2023-06-05 LAB — BASIC METABOLIC PANEL WITH GFR
Anion gap: 10 (ref 5–15)
BUN: 16 mg/dL (ref 6–20)
CO2: 22 mmol/L (ref 22–32)
Calcium: 8.7 mg/dL — ABNORMAL LOW (ref 8.9–10.3)
Chloride: 106 mmol/L (ref 98–111)
Creatinine, Ser: 1.34 mg/dL — ABNORMAL HIGH (ref 0.61–1.24)
GFR, Estimated: >60 mL/min (ref >60)
Glucose, Bld: 169 mg/dL — ABNORMAL HIGH (ref 70–99)
Potassium: 4.3 mmol/L (ref 3.5–5.1)
Sodium: 138 mmol/L (ref 135–145)

## 2023-06-05 LAB — CBC
HCT: 42 % (ref 39.0–52.0)
Hemoglobin: 14.2 g/dL (ref 13.0–17.0)
MCH: 32.1 pg (ref 26.0–34.0)
MCHC: 33.8 g/dL (ref 30.0–36.0)
MCV: 95 fL (ref 80.0–100.0)
Platelets: 126 10*3/uL — ABNORMAL LOW (ref 150–400)
RBC: 4.42 MIL/uL (ref 4.22–5.81)
RDW: 12.4 % (ref 11.5–15.5)
WBC: 12.4 10*3/uL — ABNORMAL HIGH (ref 4.0–10.5)
nRBC: 0 % (ref 0.0–0.2)

## 2023-06-05 LAB — TYPE AND SCREEN
ABO/RH(D): A NEG
Antibody Screen: NEGATIVE

## 2023-06-05 LAB — ABO/RH: ABO/RH(D): A NEG

## 2023-06-05 SURGERY — PROSTATECTOMY, RADICAL, ROBOT-ASSISTED, LAPAROSCOPIC
Anesthesia: General

## 2023-06-05 MED ORDER — ACETAMINOPHEN 10 MG/ML IV SOLN
1000.0000 mg | Freq: Four times a day (QID) | INTRAVENOUS | Status: AC
  Administered 2023-06-05 – 2023-06-06 (×3): 1000 mg via INTRAVENOUS
  Filled 2023-06-05 (×3): qty 100

## 2023-06-05 MED ORDER — PREDNISOLONE ACETATE 1 % OP SUSP
1.0000 [drp] | OPHTHALMIC | Status: DC
  Administered 2023-06-05 – 2023-06-06 (×3): 1 [drp] via OPHTHALMIC
  Filled 2023-06-05: qty 5

## 2023-06-05 MED ORDER — ORAL CARE MOUTH RINSE: 15.0000 mL | Freq: Once | OROMUCOSAL | Status: AC

## 2023-06-05 MED ORDER — BUPIVACAINE-EPINEPHRINE 0.5% -1:200000 IJ SOLN
INTRAMUSCULAR | Status: DC | PRN
  Administered 2023-06-05: 30 mL

## 2023-06-05 MED ORDER — FENTANYL CITRATE (PF) 100 MCG/2ML IJ SOLN
INTRAMUSCULAR | Status: DC | PRN
  Administered 2023-06-05 (×2): 100 ug via INTRAVENOUS
  Administered 2023-06-05 (×2): 50 ug via INTRAVENOUS

## 2023-06-05 MED ORDER — MORPHINE SULFATE (PF) 2 MG/ML IV SOLN: 2.0000 mg | INTRAVENOUS | Status: DC | PRN

## 2023-06-05 MED ORDER — ACETAMINOPHEN 10 MG/ML IV SOLN
INTRAVENOUS | Status: AC
  Filled 2023-06-05: qty 100

## 2023-06-05 MED ORDER — ROCURONIUM BROMIDE 10 MG/ML (PF) SYRINGE
PREFILLED_SYRINGE | INTRAVENOUS | Status: AC
  Filled 2023-06-05: qty 10

## 2023-06-05 MED ORDER — ROCURONIUM BROMIDE 10 MG/ML (PF) SYRINGE
PREFILLED_SYRINGE | INTRAVENOUS | Status: DC | PRN
  Administered 2023-06-05: 20 mg via INTRAVENOUS
  Administered 2023-06-05: 70 mg via INTRAVENOUS
  Administered 2023-06-05: 30 mg via INTRAVENOUS
  Administered 2023-06-05: 20 mg via INTRAVENOUS
  Administered 2023-06-05: 10 mg via INTRAVENOUS
  Administered 2023-06-05: 20 mg via INTRAVENOUS

## 2023-06-05 MED ORDER — LACTATED RINGERS IR SOLN
Status: DC | PRN
  Administered 2023-06-05: 1000 mL

## 2023-06-05 MED ORDER — CEFAZOLIN SODIUM 1 G IJ SOLR
INTRAMUSCULAR | Status: AC
  Filled 2023-06-05: qty 20

## 2023-06-05 MED ORDER — LATANOPROST 0.005 % OP SOLN
1.0000 [drp] | Freq: Every day | OPHTHALMIC | Status: DC
Start: 2023-06-05 — End: 2023-06-06
  Administered 2023-06-05: 1 [drp] via OPHTHALMIC
  Filled 2023-06-05: qty 2.5

## 2023-06-05 MED ORDER — STERILE WATER FOR IRRIGATION IR SOLN
Status: DC | PRN
  Administered 2023-06-05: 1000 mL

## 2023-06-05 MED ORDER — DOCUSATE SODIUM 100 MG PO CAPS
100.0000 mg | ORAL_CAPSULE | Freq: Two times a day (BID) | ORAL | Status: DC
  Administered 2023-06-05 – 2023-06-06 (×2): 100 mg via ORAL
  Filled 2023-06-05 (×2): qty 1

## 2023-06-05 MED ORDER — LIDOCAINE HCL (PF) 2 % IJ SOLN
INTRAMUSCULAR | Status: DC | PRN
  Administered 2023-06-05: 100 mg via INTRADERMAL

## 2023-06-05 MED ORDER — LACTATED RINGERS IV SOLN: INTRAVENOUS | Status: DC

## 2023-06-05 MED ORDER — ERYTHROMYCIN 5 MG/GM OP OINT
1.0000 | TOPICAL_OINTMENT | Freq: Four times a day (QID) | OPHTHALMIC | Status: DC
  Filled 2023-06-05: qty 1

## 2023-06-05 MED ORDER — LIDOCAINE HCL (PF) 2 % IJ SOLN
INTRAMUSCULAR | Status: AC
  Filled 2023-06-05: qty 5

## 2023-06-05 MED ORDER — BUPIVACAINE LIPOSOME 1.3 % IJ SUSP
INTRAMUSCULAR | Status: DC | PRN
  Administered 2023-06-05: 20 mL

## 2023-06-05 MED ORDER — ACETAMINOPHEN 500 MG PO TABS: 1000.0000 mg | ORAL_TABLET | Freq: Three times a day (TID) | ORAL | Status: DC

## 2023-06-05 MED ORDER — DOCUSATE SODIUM 100 MG PO CAPS: 100.0000 mg | ORAL_CAPSULE | Freq: Two times a day (BID) | ORAL | 0 refills | Status: AC

## 2023-06-05 MED ORDER — CHLORHEXIDINE GLUCONATE CLOTH 2 % EX PADS
6.0000 | MEDICATED_PAD | Freq: Every day | CUTANEOUS | Status: DC
  Administered 2023-06-05 – 2023-06-06 (×2): 6 via TOPICAL

## 2023-06-05 MED ORDER — ERYTHROMYCIN 5 MG/GM OP OINT
1.0000 | TOPICAL_OINTMENT | Freq: Four times a day (QID) | OPHTHALMIC | Status: DC
  Administered 2023-06-05 – 2023-06-06 (×2): 1 via OPHTHALMIC
  Filled 2023-06-05: qty 3.5

## 2023-06-05 MED ORDER — BRINZOLAMIDE 1 % OP SUSP
1.0000 [drp] | Freq: Two times a day (BID) | OPHTHALMIC | Status: DC
  Administered 2023-06-05: 1 [drp] via OPHTHALMIC
  Filled 2023-06-05: qty 10

## 2023-06-05 MED ORDER — SULFAMETHOXAZOLE-TRIMETHOPRIM 800-160 MG PO TABS: 1.0000 | ORAL_TABLET | Freq: Two times a day (BID) | ORAL | 0 refills | Status: AC

## 2023-06-05 MED ORDER — BUPIVACAINE LIPOSOME 1.3 % IJ SUSP
INTRAMUSCULAR | Status: AC
  Filled 2023-06-05: qty 20

## 2023-06-05 MED ORDER — DEXAMETHASONE SODIUM PHOSPHATE 10 MG/ML IJ SOLN
INTRAMUSCULAR | Status: AC
  Filled 2023-06-05: qty 1

## 2023-06-05 MED ORDER — ACETAMINOPHEN 500 MG PO TABS: 1000.0000 mg | ORAL_TABLET | Freq: Once | ORAL | Status: AC

## 2023-06-05 MED ORDER — TRAMADOL HCL 50 MG PO TABS
50.0000 mg | ORAL_TABLET | Freq: Four times a day (QID) | ORAL | 0 refills | Status: DC | PRN
Start: 2023-06-05 — End: 2023-11-17

## 2023-06-05 MED ORDER — HEMOSTATIC AGENTS (NO CHARGE) OPTIME
TOPICAL | Status: DC | PRN
  Administered 2023-06-05: 1 via TOPICAL

## 2023-06-05 MED ORDER — ACETAMINOPHEN 500 MG PO TABS
ORAL_TABLET | ORAL | Status: AC
  Filled 2023-06-05: qty 1

## 2023-06-05 MED ORDER — ONDANSETRON HCL 4 MG/2ML IJ SOLN
INTRAMUSCULAR | Status: DC | PRN
Start: 2023-06-05 — End: 2023-06-05
  Administered 2023-06-05: 4 mg via INTRAVENOUS

## 2023-06-05 MED ORDER — BUPIVACAINE-EPINEPHRINE (PF) 0.5% -1:200000 IJ SOLN
INTRAMUSCULAR | Status: AC
  Filled 2023-06-05: qty 60

## 2023-06-05 MED ORDER — KETOROLAC TROMETHAMINE 15 MG/ML IJ SOLN
15.0000 mg | Freq: Four times a day (QID) | INTRAMUSCULAR | Status: DC
  Administered 2023-06-05 – 2023-06-06 (×4): 15 mg via INTRAVENOUS
  Filled 2023-06-05 (×4): qty 1

## 2023-06-05 MED ORDER — ONDANSETRON HCL 4 MG/2ML IJ SOLN: 4.0000 mg | INTRAMUSCULAR | Status: DC | PRN

## 2023-06-05 MED ORDER — FENTANYL CITRATE (PF) 100 MCG/2ML IJ SOLN
INTRAMUSCULAR | Status: AC
Start: 2023-06-05 — End: ?
  Filled 2023-06-05: qty 2

## 2023-06-05 MED ORDER — HYDROMORPHONE HCL 1 MG/ML IJ SOLN
0.2500 mg | INTRAMUSCULAR | Status: DC | PRN
  Administered 2023-06-05 (×2): 0.25 mg via INTRAVENOUS
  Administered 2023-06-05: 0.5 mg via INTRAVENOUS

## 2023-06-05 MED ORDER — SODIUM CHLORIDE 0.9 % IV SOLN: INTRAVENOUS | Status: DC

## 2023-06-05 MED ORDER — ONDANSETRON HCL 4 MG/2ML IJ SOLN
INTRAMUSCULAR | Status: AC
  Filled 2023-06-05: qty 2

## 2023-06-05 MED ORDER — FENTANYL CITRATE (PF) 100 MCG/2ML IJ SOLN
INTRAMUSCULAR | Status: AC
  Filled 2023-06-05: qty 2

## 2023-06-05 MED ORDER — OXYCODONE HCL 5 MG PO TABS: 5.0000 mg | ORAL_TABLET | Freq: Once | ORAL | Status: DC | PRN

## 2023-06-05 MED ORDER — SUGAMMADEX SODIUM 200 MG/2ML IV SOLN
INTRAVENOUS | Status: DC | PRN
  Administered 2023-06-05: 200 mg via INTRAVENOUS

## 2023-06-05 MED ORDER — PANTOPRAZOLE SODIUM 40 MG PO TBEC
40.0000 mg | DELAYED_RELEASE_TABLET | Freq: Every day | ORAL | Status: DC
Start: 2023-06-06 — End: 2023-06-06
  Administered 2023-06-06: 40 mg via ORAL
  Filled 2023-06-05: qty 1

## 2023-06-05 MED ORDER — MIDAZOLAM HCL 2 MG/2ML IJ SOLN
INTRAMUSCULAR | Status: AC
  Filled 2023-06-05: qty 2

## 2023-06-05 MED ORDER — OXYCODONE HCL 5 MG PO TABS: 5.0000 mg | ORAL_TABLET | ORAL | Status: DC | PRN

## 2023-06-05 MED ORDER — CEFAZOLIN SODIUM-DEXTROSE 2-4 GM/100ML-% IV SOLN
INTRAVENOUS | Status: AC
  Filled 2023-06-05: qty 100

## 2023-06-05 MED ORDER — SODIUM CHLORIDE (PF) 0.9 % IJ SOLN
INTRAMUSCULAR | Status: AC
  Filled 2023-06-05: qty 20

## 2023-06-05 MED ORDER — ALBUMIN HUMAN 5 % IV SOLN
INTRAVENOUS | Status: AC
  Filled 2023-06-05: qty 500

## 2023-06-05 MED ORDER — CEFAZOLIN SODIUM-DEXTROSE 2-4 GM/100ML-% IV SOLN
2.0000 g | Freq: Once | INTRAVENOUS | Status: AC
  Administered 2023-06-05 (×2): 2 g via INTRAVENOUS

## 2023-06-05 MED ORDER — ARTIFICIAL TEARS OPHTHALMIC OINT
TOPICAL_OINTMENT | OPHTHALMIC | Status: AC
  Filled 2023-06-05: qty 3.5

## 2023-06-05 MED ORDER — DEXAMETHASONE SODIUM PHOSPHATE 10 MG/ML IJ SOLN
INTRAMUSCULAR | Status: DC | PRN
  Administered 2023-06-05: 10 mg via INTRAVENOUS

## 2023-06-05 MED ORDER — TRAMADOL HCL 50 MG PO TABS
50.0000 mg | ORAL_TABLET | Freq: Four times a day (QID) | ORAL | Status: DC | PRN
  Administered 2023-06-05: 50 mg via ORAL
  Filled 2023-06-05: qty 1

## 2023-06-05 MED ORDER — MIDAZOLAM HCL 2 MG/2ML IJ SOLN: 0.5000 mg | Freq: Once | INTRAMUSCULAR | Status: DC | PRN

## 2023-06-05 MED ORDER — ACETAMINOPHEN 500 MG PO TABS
ORAL_TABLET | ORAL | Status: AC
  Administered 2023-06-05: 1000 mg via ORAL
  Filled 2023-06-05: qty 2

## 2023-06-05 MED ORDER — SODIUM CHLORIDE 0.9 % IV BOLUS
1000.0000 mL | Freq: Once | INTRAVENOUS | Status: AC
  Administered 2023-06-05: 1000 mL via INTRAVENOUS

## 2023-06-05 MED ORDER — HYDROMORPHONE HCL 1 MG/ML IJ SOLN
INTRAMUSCULAR | Status: AC
  Filled 2023-06-05: qty 1

## 2023-06-05 MED ORDER — CEFAZOLIN SODIUM-DEXTROSE 2-4 GM/100ML-% IV SOLN
INTRAVENOUS | Status: AC
Start: 2023-06-05 — End: 2023-06-05
  Filled 2023-06-05: qty 100

## 2023-06-05 MED ORDER — MEPERIDINE HCL 50 MG/ML IJ SOLN
6.2500 mg | INTRAMUSCULAR | Status: DC | PRN
Start: 2023-06-05 — End: 2023-06-05

## 2023-06-05 MED ORDER — OXYCODONE HCL 5 MG/5ML PO SOLN: 5.0000 mg | Freq: Once | ORAL | Status: DC | PRN

## 2023-06-05 MED ORDER — PROPOFOL 10 MG/ML IV BOLUS
INTRAVENOUS | Status: AC
  Filled 2023-06-05: qty 20

## 2023-06-05 MED ORDER — MIDAZOLAM HCL 5 MG/5ML IJ SOLN
INTRAMUSCULAR | Status: DC | PRN
  Administered 2023-06-05: 2 mg via INTRAVENOUS

## 2023-06-05 MED ORDER — ALBUMIN HUMAN 5 % IV SOLN: INTRAVENOUS | Status: DC | PRN

## 2023-06-05 MED ORDER — PROPOFOL 10 MG/ML IV BOLUS
INTRAVENOUS | Status: DC | PRN
  Administered 2023-06-05: 200 mg via INTRAVENOUS

## 2023-06-05 MED ORDER — CHLORHEXIDINE GLUCONATE 0.12 % MT SOLN
15.0000 mL | Freq: Once | OROMUCOSAL | Status: AC
  Administered 2023-06-05: 15 mL via OROMUCOSAL

## 2023-06-05 SURGICAL SUPPLY — 61 items
APPLICATOR ARISTA FLEXITIP XL (MISCELLANEOUS) IMPLANT
APPLICATOR COTTON TIP 6 STRL (MISCELLANEOUS) ×1 IMPLANT
APPLICATOR COTTON TIP 6IN STRL (MISCELLANEOUS) ×1 IMPLANT
BAG COUNTER SPONGE SURGICOUNT (BAG) IMPLANT
CATH FOLEY 2WAY SLVR 18FR 30CC (CATHETERS) ×1 IMPLANT
CATH TIEMANN FOLEY 18FR 5CC (CATHETERS) ×1 IMPLANT
CHLORAPREP W/TINT 26 (MISCELLANEOUS) ×1 IMPLANT
CLIP LIGATING HEM O LOK PURPLE (MISCELLANEOUS) ×1 IMPLANT
COVER SURGICAL LIGHT HANDLE (MISCELLANEOUS) ×1 IMPLANT
COVER TIP SHEARS 8 DVNC (MISCELLANEOUS) ×1 IMPLANT
CUTTER ECHEON FLEX ENDO 45 340 (ENDOMECHANICALS) ×1 IMPLANT
DERMABOND ADVANCED .7 DNX12 (GAUZE/BANDAGES/DRESSINGS) ×1 IMPLANT
DRAPE ARM DVNC X/XI (DISPOSABLE) ×4 IMPLANT
DRAPE COLUMN DVNC XI (DISPOSABLE) ×1 IMPLANT
DRAPE SURG IRRIG POUCH 19X23 (DRAPES) ×1 IMPLANT
DRIVER NDL LRG 8 DVNC XI (INSTRUMENTS) ×2 IMPLANT
DRIVER NDLE LRG 8 DVNC XI (INSTRUMENTS) ×2 IMPLANT
DRSG TEGADERM 4X4.75 (GAUZE/BANDAGES/DRESSINGS) ×1 IMPLANT
ELECT PENCIL ROCKER SW 15FT (MISCELLANEOUS) ×1 IMPLANT
ELECT REM PT RETURN 15FT ADLT (MISCELLANEOUS) ×1 IMPLANT
FORCEPS BPLR LNG DVNC XI (INSTRUMENTS) ×1 IMPLANT
FORCEPS PROGRASP DVNC XI (FORCEP) ×1 IMPLANT
GAUZE 4X4 16PLY ~~LOC~~+RFID DBL (SPONGE) IMPLANT
GAUZE SPONGE 2X2 8PLY STRL LF (GAUZE/BANDAGES/DRESSINGS) IMPLANT
GAUZE SPONGE 4X4 12PLY STRL (GAUZE/BANDAGES/DRESSINGS) ×1 IMPLANT
GLOVE BIO SURGEON STRL SZ 6.5 (GLOVE) ×1 IMPLANT
GLOVE SURG LX STRL 7.5 STRW (GLOVE) ×2 IMPLANT
GOWN STRL REUS W/ TWL XL LVL3 (GOWN DISPOSABLE) ×2 IMPLANT
GOWN STRL SURGICAL XL XLNG (GOWN DISPOSABLE) ×1 IMPLANT
HEMOSTAT ARISTA ABSORB 3G PWDR (HEMOSTASIS) IMPLANT
HOLDER FOLEY CATH W/STRAP (MISCELLANEOUS) ×1 IMPLANT
IRRIG SUCT STRYKERFLOW 2 WTIP (MISCELLANEOUS) ×1 IMPLANT
IRRIGATION SUCT STRKRFLW 2 WTP (MISCELLANEOUS) ×1 IMPLANT
IV LACTATED RINGERS 1000ML (IV SOLUTION) ×1 IMPLANT
KIT TURNOVER KIT A (KITS) IMPLANT
PACK ROBOT UROLOGY CUSTOM (CUSTOM PROCEDURE TRAY) ×1 IMPLANT
PAD POSITIONING PINK XL (MISCELLANEOUS) ×1 IMPLANT
PLUG CATH AND CAP STRL 200 (CATHETERS) ×1 IMPLANT
RELOAD STAPLE 45 4.1 GRN THCK (STAPLE) ×1 IMPLANT
SCISSORS MNPLR CVD DVNC XI (INSTRUMENTS) ×1 IMPLANT
SEAL UNIV 5-12 XI (MISCELLANEOUS) ×4 IMPLANT
SET CYSTO W/LG BORE CLAMP LF (SET/KITS/TRAYS/PACK) IMPLANT
SET TUBE SMOKE EVAC HIGH FLOW (TUBING) ×1 IMPLANT
SOL ELECTROSURG ANTI STICK (MISCELLANEOUS) ×1 IMPLANT
SOL PREP POV-IOD 4OZ 10% (MISCELLANEOUS) ×1 IMPLANT
SOLUTION ELECTROSURG ANTI STCK (MISCELLANEOUS) ×1 IMPLANT
SPIKE FLUID TRANSFER (MISCELLANEOUS) ×1 IMPLANT
SPONGE T-LAP 4X18 ~~LOC~~+RFID (SPONGE) IMPLANT
STAPLE RELOAD 45 GRN (STAPLE) ×1 IMPLANT
SUT ETHILON 3 0 PS 1 (SUTURE) ×1 IMPLANT
SUT MNCRL AB 4-0 PS2 18 (SUTURE) ×2 IMPLANT
SUT STRATA PDS 2-0 15 CT-2.5 (SUTURE) IMPLANT
SUT V-LOC BARB 180 2/0GR6 GS22 (SUTURE) ×1 IMPLANT
SUT VIC AB 0 CT1 27XBRD ANTBC (SUTURE) ×1 IMPLANT
SUT VIC AB 3-0 SH 27XBRD (SUTURE) IMPLANT
SUT VICRYL 0 UR6 27IN ABS (SUTURE) ×2 IMPLANT
SUT VLOC BARB 180 ABS3/0GR12 (SUTURE) ×2 IMPLANT
SUTURE STRAT PDS 2-0 15 CT-2.5 (SUTURE) ×1 IMPLANT
SUTURE V-LC BRB 180 2/0GR6GS22 (SUTURE) ×1 IMPLANT
SUTURE VLOC BRB 180 ABS3/0GR12 (SUTURE) ×2 IMPLANT
WATER STERILE IRR 1000ML POUR (IV SOLUTION) ×1 IMPLANT

## 2023-06-05 NOTE — Anesthesia Postprocedure Evaluation (Signed)
 Anesthesia Post Note  Patient: Roy [name]  Procedure(s) Performed: PROSTATECTOMY, RADICAL, ROBOT-ASSISTED, LAPAROSCOPIC LYMPHADENECTOMY, PELVIS, ROBOT-ASSISTED     Patient location during evaluation: PACU Anesthesia Type: General Level of consciousness: awake and alert, patient cooperative and oriented Pain management: pain level controlled Vital Signs Assessment: post-procedure vital signs reviewed and stable Respiratory status: spontaneous breathing, nonlabored ventilation and respiratory function stable Cardiovascular status: blood pressure returned to baseline and stable Postop Assessment: no apparent nausea or vomiting Anesthetic complications: no   No notable events documented.  Last Vitals:  Vitals:   06/05/23 1315 06/05/23 1330  BP: 106/69 (!) 121/100  Pulse: 81 84  Resp: 17 13  Temp:    SpO2: 91% 93%    Last Pain:  Vitals:   06/05/23 1344  TempSrc:   PainSc: 4                  Coron Rossano,E. Lailie Smead

## 2023-06-05 NOTE — Op Note (Signed)
 Preoperative diagnosis:  Prostate Cancer   Postoperative diagnosis:  same   Procedure: Robotic assisted laparoscopic radical prostatectomy Bilateral pelvic lymph node dissection  Surgeon: Crist Fat, MD First Assistant: Jonny Ruiz, MD  Anesthesia: General  Complications: None  Intraoperative findings: difficult posterior plan with lots of adhesions, bilateral nerve spare, tight vesico-urethral anastamosis  EBL: 450cc  Specimens:  #1.  Prostate and seminal vesicals #2.  Bilateral pelvic lymph nodes  Indication: Roy [name] is a 53 y.o. patient with prostate cancer.  After reviewing the management options for treatment, he elected to proceed with the removal of his prostate. We have discussed the potential benefits and risks of the procedure, side effects of the proposed treatment, the likelihood of the patient achieving the goals of the procedure, and any potential problems that might occur during the procedure or recuperation. Informed consent has been obtained.  Description of procedure:  The patient was consented in the preoperative holding area. He was in brought back to the operating room placed the table in supine position. General anesthesia was then induced and endotracheal tube was inserted. He was then placed in dorsolithotomy position and placed in steep Trendelenburg. He was then prepped and draped in the routine sterile fashion. We, the first assistant and I, then began by making a 10 mm incision supraumbilical midline incision the skin down through into the peritoneum. Then placed a 8 mm trocar. I then inflated the abdomen and inserted the 0 robotic lens. We then placed 2 additional a 8 millimeter trochars in the patient's left lower abdomen proximally 9 cm apart and 2 trochars on the patient's right lower abdomen, one was an 8 mm trocar and the one most lateral was a 12 mm trocar which was used as the assistant port. A 5 mm trocar was placed by  triangulating the 2 right lateral ports as a second assistant port. These ports were all placed under visual guidance. Once the ports were noted to be satisfactory position the robot was docked. We started with the 0 lens, monopolar scissors in the right hand and the [place] forceps the left hand as well as a fenestrated grasper as the third arm on the left-hand side.   We, the first assistant and I,  began our dissection the posterior plane incising the peritoneum at the level of the vas deferens. Isolated the left vas deferens and dissected it proximally towards the spermatic cord for 5 cm prior to ligating it. Then used this as traction to isolate the left the seminal vesicle which was then undressed bluntly and completely dissected out, all vessels were cauterized with a combination of bipolar and the monopolar scissors. We then turned our attention to the right side and similarly dissected out the right vas deferens and seminal vesicle. Once the SVs had been freed, we turned our attention to the posterior plane and bluntly dissected the tissue between the rectum and the posterior wall of the prostate bluntly out towards the apex.    At this point the bladder was taken down starting at the urachal remnant with a combination of both blunt dissection and sharp dissection using monopolar cautery the bladder was dropped down in the usual fashion to the medial umbilical ligaments laterally and the dorsal vein of the prostate anteriorly creating our space of Retzius. We then turned our attention to the endopelvic fascia which was incised laterally starting on the patient's right-hand side the levator muscles were pushed off the prostate laterally up towards the dorsal  vein complex on the right-hand side. This process was then repeated on the left-hand side and a nice notch was created for the dorsal vein. I then used a 45mm stapler to staple the dorsal vein.   We,the first assistant and I, then located the  bladder neck at the vesicoprostatic junction and using the monopolar scissors dissected down through the perivesical tissues and the bladder neck down to the prostatic urethra. The catheter was then deflated and pulled through our urethral opening and then used to retract the prostate anteriorly for the posterior bladder neck dissection. Once through the bladder neck and into the posterior plane of the prostate, the SVs were brought through the opening. The left pedicle was then isolated and systematically ligated with Weck clips and scissors. The nerve bundle was then peeled off the posterior lateral aspect of the prostate and bluntly dissected away off the prostate.  This was then repeated on the right side.    I then came down through the dorsal venous complex anteriorly down to the membranous urethra using the monopolar. Once down to the urethra, it was transected sharply and the apex of the prostate was then dissected off the levator and rectourethralis muscles. Once the apex of the prostate had been dissected free we came back to the base of the prostate and bluntly push the rectum and nerve vascular bundle off the prostate the patient's left and used clips on the patient's right to free the prostate. Once the prostate was free it was placed off to the side. The pelvis was then irrigated with normal saline and noted to be relatively hemostatic.  Attention was then turned to the right pelvic sidewall. The fibrofatty tissue between the external iliac vein, confluence of the iliac vessels, hypogastric artery, and Cooper's ligament was dissected free from the pelvic sidewall with care to preserve the obturator nerve. Weck clips were used for lymphostasis and hemostasis. An identical procedure was performed on the contralateral side and the lymphatic packets were removed for permanent pathologic analysis.  The prostate and both lymph node tissues were placed in the Endo Catch bag and the string brought to the  5 mm port.    The vesicourethral anastomosis was then completed with 2 interlocking 3-0 V. lock sutures running the anastomosis in the 6:00 position to the 12:00 position on each side and then tying it off on the top. The final catheter was then passed through the patient's urethra and into the bladder and 120 cc was instilled into the bladder to test the anastomosis. As there was no leak a 13 [place] Blake drain was passed through the left lateral port and placed around the vesicourethral anastomosis. A 12 mm assistant port on the right lateral side was then closed with 0 Vicryl with the help of the [employer] needle. The 12 mm midline infraumbilical incision was then extended another centimeter taken down and the fascia opened to remove the Endo Catch bag with the prostate specimen. The fascia was then closed with a 0 Vicryl and all skin ports were closed with 4-0 Monocryl in a subcutaneous fashion. Dermabond glue was then applied to the incisions. The drain was then secured to the skin with a 0 nylon stitch and dressing applied.   At the end of the case all laps needles and sponges had been accounted for. There no immediate complications. The patient returned to the PACU in stable condition.

## 2023-06-05 NOTE — Discharge Instructions (Signed)

## 2023-06-05 NOTE — Anesthesia Procedure Notes (Signed)
 Procedure Name: Intubation Date/Time: 06/05/2023 7:41 AM  Performed by: Doran Clay, CRNAPre-anesthesia Checklist: Patient identified, Emergency Drugs available, Suction available, Patient being monitored and Timeout performed Patient Re-evaluated:Patient Re-evaluated prior to induction Oxygen Delivery Method: Circle system utilized Preoxygenation: Pre-oxygenation with 100% oxygen Induction Type: IV induction Ventilation: Mask ventilation without difficulty Laryngoscope Size: Mac and 4 Grade View: Grade I Tube type: Oral Tube size: 7.5 mm Number of attempts: 1 Airway Equipment and Method: Stylet Placement Confirmation: ETT inserted through vocal cords under direct vision, positive ETCO2 and breath sounds checked- equal and bilateral Secured at: 23 cm Tube secured with: Tape Dental Injury: Teeth and Oropharynx as per pre-operative assessment

## 2023-06-05 NOTE — Transfer of Care (Signed)
 Immediate Anesthesia Transfer of Care Note  Patient: Roy [name]  Procedure(s) Performed: PROSTATECTOMY, RADICAL, ROBOT-ASSISTED, LAPAROSCOPIC LYMPHADENECTOMY, PELVIS, ROBOT-ASSISTED  Patient Location: PACU  Anesthesia Type:General  Level of Consciousness: sedated  Airway & Oxygen Therapy: Patient Spontanous Breathing and Patient connected to face mask oxygen  Post-op Assessment: Report given to RN and Post -op Vital signs reviewed and stable  Post vital signs: Reviewed and stable  Last Vitals:  Vitals Value Taken Time  BP 138/86 06/05/23 1300  Temp    Pulse 86 06/05/23 1302  Resp 18 06/05/23 1302  SpO2 94 % 06/05/23 1302  Vitals shown include unfiled device data.  Last Pain:  Vitals:   06/05/23 1300  TempSrc:   PainSc: Asleep      Patients Stated Pain Goal: 4 (06/05/23 0547)  Complications: No notable events documented.

## 2023-06-05 NOTE — Progress Notes (Signed)
   06/05/23 2207  BiPAP/CPAP/SIPAP  $ Non-Invasive Home Ventilator  Initial  BiPAP/CPAP/SIPAP Pt Type Adult  BiPAP/CPAP/SIPAP Resmed  Mask Type Nasal pillows  Dentures removed? Not applicable  Respiratory Rate 16 breaths/min  EPAP 6 cmH2O (per patient stated home settings)  FiO2 (%) 21 %  Patient Home Machine No  Patient Home Mask Yes  Patient Home Tubing Yes  Auto Titrate No  CPAP/SIPAP surface wiped down Yes  Device Plugged into RED Power Outlet Yes

## 2023-06-05 NOTE — Interval H&P Note (Signed)
 History and Physical Interval Note:  06/05/2023 7:25 AM  Roy [name]  has presented today for surgery, with the diagnosis of PROSTATE CANCER.  The various methods of treatment have been discussed with the patient and family. After consideration of risks, benefits and other options for treatment, the patient has consented to  Procedure(s) with comments: PROSTATECTOMY, RADICAL, ROBOT-ASSISTED, LAPAROSCOPIC (N/A) - 270 MINUTE CASE LYMPHADENECTOMY, PELVIS, ROBOT-ASSISTED (N/A) as a surgical intervention.  The patient's history has been reviewed, patient examined, no change in status, stable for surgery.  I have reviewed the patient's chart and labs.  Questions were answered to the patient's satisfaction.     Crist Fat

## 2023-06-05 NOTE — H&P (Signed)
 Prostate cancer   Prostate cancer profile  Stage: T1c  PSA: 7.3  Biopsy 03/04/2023, 5/12 cores positive: Gleason 3+4 = 7 in the left apex and right apex as well as the left mid medial. Gleason 3+3 = 6 in right mid apex and left mid lateral  Prostate volume: 35 g   Prostate cancer nomogram after radical prostatectomy:  Cancer free survival - 98% at 15 years  PFS (surgery) - 81% at 5 years / 64% at 10 years   IPSS: 30  SH IM: 24   The patient had an anterior approach lumbosacral fusion, has a lower midline incision. He is also had numerous orthopedic surgeries, no additional abdominal surgeries.   The patient is otherwise quite healthy.   Interval: Patient is here today for preop evaluation, has prostatectomy scheduled in 10 days. He is otherwise done fairly well, but did have retinal tear in his right eye recently that he has had several surgeries on to fix it. He this seems to be an ongoing issue. Otherwise the patient been doing really reasonably well, just returned from a ski trip in the [place].     ALLERGIES: Dayquil LIQD    MEDICATIONS: Omeprazole  Valium 10 MG Tablet 2 tablet PO once take one hour prior to procedure  Allegra Allergy  Combigan  Eye Drops     GU PSH: Prostate Needle Biopsy - 03/03/2023     NON-GU PSH: Anesth, Lower Arm Surgery Deviated Septum Surgery Knee Arthroscopy, Bilateral Shoulder Arthroscopy/surgery, Bilateral Surgical Pathology, Gross And Microscopic Examination For Prostate Needle - 03/03/2023 Visit Complexity (formerly GPC1X) - 03/11/2023     GU PMH: Prostate Cancer - 04/02/2023, - 03/11/2023 Elevated PSA - 03/03/2023, - 12/19/2022, - 2018, - 2018 Encounter for Prostate Cancer screening - 2019    NON-GU PMH: Muscle weakness (generalized) - 04/02/2023 GERD Glaucoma Hypercholesterolemia    FAMILY HISTORY: 2 daughters - Runs in Family 2 sons - Runs in Family Diverticulitis - Mother   SOCIAL HISTORY: Marital Status: Married Preferred  Language: English; Ethnicity: Not Hispanic Or Latino; Race: White Current Smoking Status: Patient has never smoked.   Tobacco Use Assessment Completed: Used Tobacco in last 30 days? Does drink.  Drinks 2 caffeinated drinks per day.    REVIEW OF SYSTEMS:    GU Review Male:   Patient denies frequent urination, hard to postpone urination, burning/ pain with urination, get up at night to urinate, leakage of urine, stream starts and stops, trouble starting your stream, have to strain to urinate , erection problems, and penile pain.  Gastrointestinal (Upper):   Patient denies nausea, vomiting, and indigestion/ heartburn.  Gastrointestinal (Lower):   Patient denies diarrhea and constipation.  Constitutional:   Patient denies fever, night sweats, weight loss, and fatigue.  Skin:   Patient denies skin rash/ lesion and itching.  Eyes:   Patient denies blurred vision and double vision.  Ears/ Nose/ Throat:   Patient denies sore throat and sinus problems.  Hematologic/Lymphatic:   Patient denies swollen glands and easy bruising.  Cardiovascular:   Patient denies leg swelling and chest pains.  Respiratory:   Patient denies shortness of breath and cough.  Endocrine:   Patient denies excessive thirst.  Musculoskeletal:   Patient denies back pain and joint pain.  Neurological:   Patient denies headaches and dizziness.  Psychologic:   Patient denies depression and anxiety.   VITAL SIGNS:      05/27/2023 08:17 AM  BP 124/88 mmHg  Pulse 69 /min  Temperature  97.8 F / 36.5 C   MULTI-SYSTEM PHYSICAL EXAMINATION:    Constitutional: Well-nourished. No physical deformities. Normally developed. Good grooming.  Respiratory: Normal breath sounds. No labored breathing, no use of accessory muscles.   Cardiovascular: Regular rate and rhythm. No murmur, no gallop. Normal temperature, normal extremity pulses, no swelling, no varicosities.      Complexity of Data:  Source Of History:  Patient  Lab Test Review:    PSA  Records Review:   AUA Symptom Score, Pathology Reports, Previous Doctor Records, Previous Patient Records, POC Tool  Urine Test Review:   Urinalysis  Urodynamics Review:   Review Bladder Scan   12/12/22 11/18/21 01/26/18 01/08/17 10/21/16 08/18/16  PSA  Total PSA 7.35 ng/mL 6.32 ng/ml 2.49 ng/mL 3.52 ng/mL 3.18 ng/mL 4.29 ng/ml  Free PSA 0.35 ng/mL       % Free PSA 5 % PSA         PROCEDURES:          Visit Complexity - G2211          Urinalysis Dipstick Dipstick Cont'd  Color: Yellow Bilirubin: Neg mg/dL  Appearance: Clear Ketones: Neg mg/dL  Specific Gravity: 4.098 Blood: Neg ery/uL  pH: 6.0 Protein: Neg mg/dL  Glucose: Neg mg/dL Urobilinogen: 0.2 mg/dL    Nitrites: Neg    Leukocyte Esterase: Neg leu/uL    ASSESSMENT:      ICD-10 Details  1 GU:   Prostate Cancer - C61    PLAN:           Document Letter(s):  Created for Patient: Clinical Summary         Notes:   Our plan is to perform a bilateral nerve sparing radical prostatectomy with pelvic lymph node dissection.   We discussed prostatectomy and specifically robotic prostatectomy with bilateral pelvic lymphadenectomy being the technique that I most commonly perform. I showed the patient on their abdomen the approximately 6 small incision (trocar) sites as well as presumed extraction sites with robotic approach as well as possible open incision sites should open conversion be necessary. We discussed peri-operative risks including bleeding, infection, deep vein thrombosis, pulmonary embolism, compartment syndrome, nuropathy / neuropraxia, heart attack, stroke, death, as well as long-term risks such as non-cure / need for additional therapy. We specifically addressed that the procedure would compromise urinary control leading to stress incontinence which typically resolves with time and pelvic rehabilitation (Kegel's, etc..), but can sometimes be permanent and require additional therapy including surgery. We also  specifically addressed sexual sequellae including significant erectile dysfunction which typically partially resolves with time but can also be permanent and require additional therapy including surgery.   We discussed the typical hospital course including usual 1-2 night hospitalization, discharge with foley catheter in place usually for 1-2 weeks before voiding trial as well as usually 2 week recovery until able to perform most non-strenuous activity and 6 weeks until able to return to most jobs and more strenuous activity such as exercise.

## 2023-06-05 NOTE — Discharge Summary (Signed)
 Date of admission: 06/05/2023  Date of discharge: 06/06/2023  Admission diagnosis: Prostate cancer  Discharge diagnosis: same  Secondary diagnoses:  Patient Active Problem List   Diagnosis Date Noted   Prostate cancer (HCC) 06/05/2023   Malignant neoplasm of prostate (HCC) 03/21/2023   Plantar fasciitis of left foot 10/23/2021   Left shoulder pain 05/23/2020   Sciatica of right side due to displacement of lumbar intervertebral disc 02/06/2018   Spondylolysis, lumbosacral region 02/05/2018   Ruptured lumbar intervertebral disc 02/05/2018   Labral tear of shoulder, right, initial encounter 09/03/2016   Left thumb sprain 09/03/2016   Acute shoulder bursitis 05/31/2016   Low back pain 05/31/2016   Hyperlipidemia LDL goal <130 08/07/2015   Elevated fasting glucose 08/07/2015   Prostate cancer screening 08/04/2015   Diverticulosis 08/04/2015   BMI 31.0-31.9,adult 08/04/2015   Encounter for screening for HIV 08/04/2015   Encounter for preventive health examination 08/04/2015   OSA (obstructive sleep apnea) 02/16/2015   Allergic rhinitis 02/16/2015   Dermatofibroma 08/09/2012    Procedures performed: Procedure(s): PROSTATECTOMY, RADICAL, ROBOT-ASSISTED, LAPAROSCOPIC LYMPHADENECTOMY, PELVIS, ROBOT-ASSISTED  History and Physical: For full details, please see admission history and physical. Briefly, Roy [name] is a 53 y.o. year old patient with prostate cancer who underwent a robotic prostatectomy with bilateral lymph node dissection.   Hospital Course: Patient tolerated the procedure well.  He was then transferred to the floor after an uneventful PACU stay.  His hospital course was uncomplicated.  On POD#1 he had met discharge criteria: was eating a regular diet, was up and ambulating independently,  pain was well controlled, was voiding without a catheter, and was ready to for discharge.   Laboratory values:  Recent Labs    06/05/23 1324 06/06/23 0402  WBC 12.4* 9.1   HGB 14.2 12.9*  HCT 42.0 37.9*   Recent Labs    06/05/23 1324 06/06/23 0402  NA 138 137  K 4.3 4.1  CL 106 109  CO2 22 21*  GLUCOSE 169* 110*  BUN 16 15  CREATININE 1.34* 1.16  CALCIUM 8.7* 8.2*   No results for input(s): "LABPT", "INR" in the last 72 hours. No results for input(s): "LABURIN" in the last 72 hours. Results for orders placed or performed in visit on 06/28/14  Stool culture     Status: None   Collection Time: 06/28/14  3:23 PM   Specimen: STOOL  Result Value Ref Range Status   Organism ID, Bacteria No Salmonella,Shigella,Campylobacter,Yersinia,or  Final   Organism ID, Bacteria No E.coli [id number]:H7 isolated.  Final    Comment: Reduced Normal Flora present  Giardia/cryptosporidium (EIA)     Status: None   Collection Time: 06/28/14  3:23 PM   Specimen: STOOL  Result Value Ref Range Status   Giardia Screen (EIA) NEGATIVE  Final   Cryptosporidium Screen (EIA) NEGATIVE  Final  Clostridium Difficile by PCR     Status: None   Collection Time: 06/28/14  3:23 PM  Result Value Ref Range Status   Toxigenic C. Difficile by PCR Not Detected Not Detected Final    Comment: This test is for use only with liquid or soft stools; performance characteristics of other clinical specimen types have not been established.   This assay was performed by Cepheid GeneXpert(R) PCR. The performance characteristics of this assay have been determined by [employer]. Performance characteristics refer to the analytical performance of the test.     Physical Exam   Intake/Output Summary (Last 24 hours) at 06/06/2023 0729 Last data  filed at 06/06/2023 0500 Gross per 24 hour  Intake 3141.99 ml  Output 2985 ml  Net 156.99 ml    Vitals:   06/05/23 1651 06/05/23 2128 06/06/23 0107 06/06/23 0513  BP: (!) 127/92 101/81 130/77 122/83  Pulse: 79 93 91 87  Resp: 18  18 18   Temp: 98.5 F (36.9 C) 98.2 F (36.8 C) 98.3 F (36.8 C) 98.7 F (37.1 C)  TempSrc: Oral Oral    SpO2:  98% 95% 94% 94%  Weight:      Height:        Gen: NAD Resp: Satting well on RA Card: Regular rate Abd: Soft, appropriately tender, ND, incision clean dry and intact GU: Voing spontaneously  Foley catheter in place draining urine. Neuro: Alert   Disposition: Home  Discharge instruction: The patient was instructed to be ambulatory but told to refrain from heavy lifting, strenuous activity, or driving.   Discharge medications:  Allergies as of 06/06/2023       Reactions   Dayquil Multi-symptom Cold-flu [pseudoephedrine-apap-dm] Hives        Medication List     TAKE these medications    brinzolamide 1 % ophthalmic suspension Commonly known as: AZOPT Place 1 drop into both eyes 2 (two) times daily.   docusate sodium 100 MG capsule Commonly known as: Colace Take 1 capsule (100 mg total) by mouth 2 (two) times daily for 14 days.   erythromycin ophthalmic ointment Place 1 Application into the right eye 4 (four) times daily.   latanoprost 0.005 % ophthalmic solution Commonly known as: XALATAN Place 1 drop into both eyes at bedtime.   moxifloxacin 0.5 % ophthalmic solution Commonly known as: VIGAMOX Place 1 drop into both eyes in the morning, at noon, in the evening, and at bedtime.   NON FORMULARY Pt uses a c-pap nightly   omeprazole 40 MG capsule Commonly known as: PRILOSEC Take 1 capsule (40 mg total) by mouth daily.   prednisoLONE acetate 1 % ophthalmic suspension Commonly known as: PRED FORTE Place 1 drop into the right eye every 2 (two) hours while awake.   sulfamethoxazole-trimethoprim 800-160 MG tablet Commonly known as: BACTRIM DS Take 1 tablet by mouth 2 (two) times daily for 3 days. Please start the day before and continue day of and day after foley catheter removal for a total of 3 days.   traMADol 50 MG tablet Commonly known as: ULTRAM Take 1-2 tablets (50-100 mg total) by mouth every 6 (six) hours as needed for moderate pain (pain score 4-6).         Followup:   Follow-up Information     Hillery Aldo, NP Follow up on 06/12/2023.   Specialty: Nurse Practitioner Why: 0830 AM Contact information: [address] [place] [address] [place] [address] [phone]

## 2023-06-06 ENCOUNTER — Encounter: Admitting: Internal Medicine

## 2023-06-06 ENCOUNTER — Encounter (HOSPITAL_COMMUNITY): Payer: Self-pay | Admitting: Urology

## 2023-06-06 ENCOUNTER — Telehealth: Payer: Self-pay | Admitting: Internal Medicine

## 2023-06-06 DIAGNOSIS — C61 Malignant neoplasm of prostate: Secondary | ICD-10-CM | POA: Diagnosis not present

## 2023-06-06 LAB — BASIC METABOLIC PANEL WITH GFR
Anion gap: 7 (ref 5–15)
BUN: 15 mg/dL (ref 6–20)
CO2: 21 mmol/L — ABNORMAL LOW (ref 22–32)
Calcium: 8.2 mg/dL — ABNORMAL LOW (ref 8.9–10.3)
Chloride: 109 mmol/L (ref 98–111)
Creatinine, Ser: 1.16 mg/dL (ref 0.61–1.24)
GFR, Estimated: >60 mL/min (ref >60)
Glucose, Bld: 110 mg/dL — ABNORMAL HIGH (ref 70–99)
Potassium: 4.1 mmol/L (ref 3.5–5.1)
Sodium: 137 mmol/L (ref 135–145)

## 2023-06-06 LAB — CBC
HCT: 37.9 % — ABNORMAL LOW (ref 39.0–52.0)
Hemoglobin: 12.9 g/dL — ABNORMAL LOW (ref 13.0–17.0)
MCH: 32.8 pg (ref 26.0–34.0)
MCHC: 34 g/dL (ref 30.0–36.0)
MCV: 96.4 fL (ref 80.0–100.0)
Platelets: 119 10*3/uL — ABNORMAL LOW (ref 150–400)
RBC: 3.93 MIL/uL — ABNORMAL LOW (ref 4.22–5.81)
RDW: 12.5 % (ref 11.5–15.5)
WBC: 9.1 10*3/uL (ref 4.0–10.5)
nRBC: 0 % (ref 0.0–0.2)

## 2023-06-06 NOTE — TOC Transition Note (Signed)
 Transition of Care [facility]) - Discharge Note   Patient Details  Name: Roy [name] MRN: [id number] Date of Birth: March 13, 1970  Transition of Care [facility]) CM/SW Contact:  Lanier Clam, RN Phone Number: 06/06/2023, 11:49 AM   Clinical Narrative:  d/c home.     Final next level of care: Home/Self Care Barriers to Discharge: Continued Medical Work up   Patient Goals and CMS Choice Patient states their goals for this hospitalization and ongoing recovery are:: Home CMS Medicare.gov Compare Post Acute Care list provided to:: Patient Choice offered to / list presented to : Patient [place] ownership interest in [facility].provided to:: Patient    Discharge Placement                       Discharge Plan and Services Additional resources added to the After Visit Summary for                                       Social Drivers of Health (SDOH) Interventions SDOH Screenings   Food Insecurity: No Food Insecurity (06/05/2023)  Housing: Low Risk  (06/05/2023)  Transportation Needs: No Transportation Needs (06/05/2023)  Utilities: Not At Risk (06/05/2023)  Alcohol Screen: Low Risk  (03/21/2023)  Depression (PHQ2-9): Low Risk  (03/21/2023)  Financial Resource Strain: Low Risk  (08/24/2020)   Received from [facility], [facility]  Social Connections: Unknown (06/27/2021)   Received from [facility], [facility]  Stress: No Stress Concern Present (08/22/2020)   Received from [facility], [facility]  Tobacco Use: Low Risk  (06/05/2023)     Readmission Risk Interventions     No data to display

## 2023-06-06 NOTE — TOC Initial Note (Signed)
 Transition of Care [facility]) - Initial/Assessment Note    Patient Details  Name: Roy [name] MRN: [id number] Date of Birth: [birth year]  Transition of Care [facility]) CM/SW Contact:    Lanier Clam, RN Phone Number: 06/06/2023, 11:48 AM  Clinical Narrative:  d/c home.                 Expected Discharge Plan: Home/Self Care Barriers to Discharge: Continued Medical Work up   Patient Goals and CMS Choice Patient states their goals for this hospitalization and ongoing recovery are:: Home CMS Medicare.gov Compare Post Acute Care list provided to:: Patient Choice offered to / list presented to : Patient  ownership interest in [facility].provided to:: Patient    Expected Discharge Plan and Services         Expected Discharge Date: 06/06/23                                    Prior Living Arrangements/Services                       Activities of Daily Living   ADL Screening (condition at time of admission) Independently performs ADLs?: Yes (appropriate for developmental age) Is the patient deaf or have difficulty hearing?: No Does the patient have difficulty seeing, even when wearing glasses/contacts?: No Does the patient have difficulty concentrating, remembering, or making decisions?: No  Permission Sought/Granted                  Emotional Assessment              Admission diagnosis:  Prostate cancer [facility]) [C61] Patient Active Problem List   Diagnosis Date Noted   Prostate cancer (HCC) 06/05/2023   Malignant neoplasm of prostate (HCC) 03/21/2023   Plantar fasciitis of left foot 10/23/2021   Left shoulder pain 05/23/2020   Sciatica of right side due to displacement of lumbar intervertebral disc 02/06/2018   Spondylolysis, lumbosacral region 02/05/2018   Ruptured lumbar intervertebral disc 02/05/2018   Labral tear of shoulder, right, initial encounter 09/03/2016   Left thumb sprain 09/03/2016   Acute shoulder bursitis  05/31/2016   Low back pain 05/31/2016   Hyperlipidemia LDL goal <130 08/07/2015   Elevated fasting glucose 08/07/2015   Prostate cancer screening 08/04/2015   Diverticulosis 08/04/2015   BMI 31.0-31.9,adult 08/04/2015   Encounter for screening for HIV 08/04/2015   Encounter for preventive health examination 08/04/2015   OSA (obstructive sleep apnea) 02/16/2015   Allergic rhinitis 02/16/2015   Dermatofibroma 08/09/2012   PCP:  Jeoffrey Massed, MD Pharmacy:   [facility] [id number] - [place],  - [address] [place] [address] Phone: [phone] Fax: [phone]  [facility] - [place], [place] - [address] [place] [address] Phone: [phone] Fax: [phone]     Social Drivers of Health (SDOH) Social History: SDOH Screenings   Food Insecurity: No Food Insecurity (06/05/2023)  Housing: Low Risk  (06/05/2023)  Transportation Needs: No Transportation Needs (06/05/2023)  Utilities: Not At Risk (06/05/2023)  Alcohol Screen: Low Risk  (03/21/2023)  Depression (PHQ2-9): Low Risk  (03/21/2023)  Financial Resource Strain: Low Risk  (08/24/2020)   Received from [facility], [facility]  Social Connections: Unknown (06/27/2021)   [facility]  Stress: No Stress Concern Present (08/22/2020)   Received from [facility], [facility]  Tobacco Use: Low Risk  (06/05/2023)   SDOH Interventions:     Readmission Risk Interventions     No data to display

## 2023-06-06 NOTE — Telephone Encounter (Signed)
 Pt currently in the hospital. Pt notified via mychart.

## 2023-06-06 NOTE — Telephone Encounter (Signed)
 Can we contact pt and tell him not to worry JMP

## 2023-06-06 NOTE — Telephone Encounter (Signed)
 Good Morning Dr. Rhea Belton, I received a call from this patient stated that whenever he was first schedule for his EGD, he had spoke to someone letting them know he could not do today because he was going to have surgery to get his prostate removed. Patient was very upset that his procedure was not cancelled when he told them he could not do April 11 th. Patient stated that he is currently still in the hospital for his prostate removal, and will call when he is feeling better.Please advise.

## 2023-06-09 ENCOUNTER — Encounter: Payer: Self-pay | Admitting: Family Medicine

## 2023-06-09 LAB — SURGICAL PATHOLOGY

## 2023-06-26 ENCOUNTER — Encounter: Payer: Self-pay | Admitting: Family Medicine

## 2023-10-03 ENCOUNTER — Other Ambulatory Visit: Payer: Self-pay | Admitting: Family Medicine

## 2023-11-14 ENCOUNTER — Telehealth: Payer: Self-pay

## 2023-11-14 NOTE — Telephone Encounter (Signed)
 error

## 2023-11-17 ENCOUNTER — Encounter: Payer: Self-pay | Admitting: Family Medicine

## 2023-11-17 ENCOUNTER — Ambulatory Visit: Payer: 59 | Admitting: Family Medicine

## 2023-11-17 VITALS — BP 103/71 | HR 75 | Temp 98.3°F | Ht 71.75 in | Wt 258.6 lb

## 2023-11-17 DIAGNOSIS — Z13220 Encounter for screening for lipoid disorders: Secondary | ICD-10-CM | POA: Diagnosis not present

## 2023-11-17 DIAGNOSIS — Z0000 Encounter for general adult medical examination without abnormal findings: Secondary | ICD-10-CM

## 2023-11-17 DIAGNOSIS — Z23 Encounter for immunization: Secondary | ICD-10-CM

## 2023-11-17 DIAGNOSIS — Z6835 Body mass index (BMI) 35.0-35.9, adult: Secondary | ICD-10-CM

## 2023-11-17 DIAGNOSIS — R7301 Impaired fasting glucose: Secondary | ICD-10-CM | POA: Diagnosis not present

## 2023-11-17 LAB — COMPREHENSIVE METABOLIC PANEL WITH GFR
ALT: 11 U/L (ref 0–53)
AST: 14 U/L (ref 0–37)
Albumin: 4.3 g/dL (ref 3.5–5.2)
Alkaline Phosphatase: 118 U/L — ABNORMAL HIGH (ref 39–117)
BUN: 18 mg/dL (ref 6–23)
CO2: 29 meq/L (ref 19–32)
Calcium: 9.5 mg/dL (ref 8.4–10.5)
Chloride: 106 meq/L (ref 96–112)
Creatinine, Ser: 1.26 mg/dL (ref 0.40–1.50)
GFR: 65.4 mL/min (ref >60.00)
Glucose, Bld: 108 mg/dL — ABNORMAL HIGH (ref 70–99)
Potassium: 4.5 meq/L (ref 3.5–5.1)
Sodium: 141 meq/L (ref 135–145)
Total Bilirubin: 0.7 mg/dL (ref 0.2–1.2)
Total Protein: 7.1 g/dL (ref 6.0–8.3)

## 2023-11-17 LAB — CBC WITH DIFFERENTIAL/PLATELET
Basophils Absolute: 0 K/uL (ref 0.0–0.1)
Basophils Relative: 0.9 % (ref 0.0–3.0)
Eosinophils Absolute: 0.2 K/uL (ref 0.0–0.7)
Eosinophils Relative: 3.7 % (ref 0.0–5.0)
HCT: 43.6 % (ref 39.0–52.0)
Hemoglobin: 14.7 g/dL (ref 13.0–17.0)
Lymphocytes Relative: 25.7 % (ref 12.0–46.0)
Lymphs Abs: 1.1 K/uL (ref 0.7–4.0)
MCHC: 33.8 g/dL (ref 30.0–36.0)
MCV: 92.2 fl (ref 78.0–100.0)
Monocytes Absolute: 0.5 K/uL (ref 0.1–1.0)
Monocytes Relative: 11.2 % (ref 3.0–12.0)
Neutro Abs: 2.6 K/uL (ref 1.4–7.7)
Neutrophils Relative %: 58.5 % (ref 43.0–77.0)
Platelets: 149 K/uL — ABNORMAL LOW (ref 150.0–400.0)
RBC: 4.73 Mil/uL (ref 4.22–5.81)
RDW: 14.3 % (ref 11.5–15.5)
WBC: 4.4 K/uL (ref 4.0–10.5)

## 2023-11-17 LAB — LIPID PANEL
Cholesterol: 251 mg/dL — ABNORMAL HIGH (ref 0–200)
HDL: 33.6 mg/dL — ABNORMAL LOW (ref >39.00)
LDL Cholesterol: 166 mg/dL — ABNORMAL HIGH (ref 0–99)
NonHDL: 217.79
Total CHOL/HDL Ratio: 7
Triglycerides: 258 mg/dL — ABNORMAL HIGH (ref 0.0–149.0)
VLDL: 51.6 mg/dL — ABNORMAL HIGH (ref 0.0–40.0)

## 2023-11-17 LAB — HEMOGLOBIN A1C: Hgb A1c MFr Bld: 6.5 % (ref 4.6–6.5)

## 2023-11-17 MED ORDER — OMEPRAZOLE 40 MG PO CPDR: 40.0000 mg | DELAYED_RELEASE_CAPSULE | Freq: Every day | ORAL | 3 refills | Status: AC

## 2023-11-17 NOTE — Progress Notes (Signed)
 Office Note 11/17/2023  CC:  Chief Complaint  Patient presents with   Annual Exam    Pt is fasting    HPI:  Patient is a 53 y.o. male who is here for annual health maintenance exam.  Monica has had a series of right eye problems over the last year.  He had a retinal tear and subsequent laser surgery.  This caused a big cataract and he recently had this extracted and is still struggling with poor vision in the right eye.  Due to all his eye problems he has not been exercising much but says he is going to get back into this habit soon.  GERD--> takes 40 mg Prilosec daily.  If he stops this medication he gets rebound reflux quite easily.   Past Medical History:  Diagnosis Date   Allergy  1998   Dayquil - hives   Chronic rhinitis    Deviated nasal septum, hypertrophy of nasal turbinates: Dr. Verlan turb reduct and septoplasty.   Colitis    Diverticulosis 2009   Colonoscopy confirmed approx 2009 (diverticulitis x 2 episodes).   GERD (gastroesophageal reflux disease)    Glaucoma    History of adenomatous polyp of colon 12/11/2015;11/2018   Recall 11/2023.   Hypercholesterolemia 2018,2019,2020   Mild; Framingham CV risk<<7.5%.  TLC.  07/2019 I recommended he see advanced lipid clinic provider.   Irritable bowel syndrome with diarrhea    Left lumbar radiculopathy 2024   Dr. Claudene and Dr. Cala   Obesity, Class I, BMI 30-34.9    OSA on CPAP    Sleep study in remote past was normal per pt report.  OSA eval w/[place] 2018.  AHI 19/hr, desat to 81%.  Titration study 01/21/2017, optimal CPAP 6 cm H20.    Prostate cancer (HCC)    elev PSA since 2018.  Prostate bx + Jan 2025. Prostatectomy 05/2023.   Sleep apnea    Spondylolisthesis at L5-S1 level    Thrombocytopenia (HCC) 2014   Mild; stable 2014-2016.  Abd u/s 02/2014 NORMAL.    Past Surgical History:  Procedure Laterality Date   ALIF--percutaneous stereotactic pedicle screw placement w/allograft (Dr. Dawn)   08/2020   BICEPS TENDON REPAIR     COLONOSCOPY W/ POLYPECTOMY  12/11/2015; 12/21/18   2017 Tubular adenoma x 1, +Diverticulosis and int hem.  Rpt 11/2018 with 1 sessile serrated poly, diverticulosis: recall 5 yrs.   EYE SURGERY     lasik   KNEE ARTHROSCOPY Bilateral    Bilat; 2003, 2004, 2010   LUMBAR DISC SURGERY  01/2018   NASAL SEPTOPLASTY W/ TURBINOPLASTY Bilateral 04/14/2015   Procedure: NASAL SEPTOPLASTY WITH BILATERAL INFERIOR  TURBINATE REDUCTION;  Surgeon: Alm Bouche, MD;  Location: [facility] OR;  Service: ENT;  Laterality: Bilateral;   PROSTATE BIOPSY  03/03/2023   +cancer   ROBOT ASSISTED LAPAROSCOPIC RADICAL PROSTATECTOMY N/A 06/05/2023   Path->cancer is organ-confined. Procedure: PROSTATECTOMY, RADICAL, ROBOT-ASSISTED, LAPAROSCOPIC;  Surgeon: Cam Morene ORN, MD;  Location: [facility] ORS;  Service: Urology;  Laterality: N/A;  270 MINUTE CASE   SHOULDER ARTHROSCOPY Bilateral    Bilat; 1993, 2000, 2007   SPINE SURGERY  08/15/2020   WISDOM TOOTH EXTRACTION     young adult   WRIST FRACTURE SURGERY Left 2010   Hockey injury, plate inserted    Family History  Problem Relation Age of Onset   Diverticulitis Mother    Colon polyps Father    Stomach cancer Paternal Aunt    Colon cancer Maternal Grandmother    Cancer  Maternal Grandmother    Pancreatic cancer Paternal Grandfather    Lung cancer Paternal Grandfather         (smoker)   Cancer Paternal Grandfather    Cancer Maternal Grandfather    Esophageal cancer Neg Hx    Rectal cancer Neg Hx     Social History   Socioeconomic History   Marital status: Married    Spouse name: Not on file   Number of children: 4   Years of education: Not on file   Highest education level: Bachelor's degree (e.g., BA, AB, BS)  Occupational History   Occupation: Art gallery manager  Tobacco Use   Smoking status: Never   Smokeless tobacco: Never  Vaping Use   Vaping status: Never Used  Substance and Sexual Activity   Alcohol use: Yes     Alcohol/week: 1.0 standard drink of alcohol    Types: 1 Cans of beer per week    Comment: beer occasional   Drug use: No   Sexual activity: Not on file  Other Topics Concern   Not on file  Social History Narrative   Married, 4 children.   No sibs.   Occupation: Doctor, hospital for [employer].   Grad from [employer] in [place], [place].   Originally from [place] , lived in [place] for a while, then relocated to [facility] Consulting civil engineer) about 2010.   Exercises irregularly, no particular dietary focus.   No T/A/Ds.   Social Drivers of Corporate investment banker Strain: Low Risk  (11/14/2023)   Overall Financial Resource Strain (CARDIA)    Difficulty of Paying Living Expenses: Not hard at all  Food Insecurity: No Food Insecurity (11/14/2023)   Hunger Vital Sign    Worried About Running Out of Food in the Last Year: Never true    Ran Out of Food in the Last Year: Never true  Transportation Needs: No Transportation Needs (11/14/2023)   PRAPARE - Administrator, Civil Service (Medical): No    Lack of Transportation (Non-Medical): No  Physical Activity: Insufficiently Active (11/14/2023)   Exercise Vital Sign    Days of Exercise per Week: 3 days    Minutes of Exercise per Session: 20 min  Stress: No Stress Concern Present (11/14/2023)   [employer] of Occupational Health - Occupational Stress Questionnaire    Feeling of Stress: Not at all  Social Connections: Socially Integrated (11/14/2023)   Social Connection and Isolation Panel    Frequency of Communication with Friends and Family: Three times a week    Frequency of Social Gatherings with Friends and Family: Once a week    Attends Religious Services: More than 4 times per year    Active Member of [employer] or Organizations: Yes    Attends Engineer, structural: More than 4 times per year    Marital Status: Married  Catering manager Violence: Not At Risk (09/13/2023)   Received from [facility]   HITS     Over the last 12 months how often did your partner physically hurt you?: Never    Over the last 12 months how often did your partner insult you or talk down to you?: Never    Over the last 12 months how often did your partner threaten you with physical harm?: Never    Over the last 12 months how often did your partner scream or curse at you?: Never    Outpatient Medications Prior to Visit  Medication Sig Dispense Refill   brinzolamide  (AZOPT ) 1 %  ophthalmic suspension Place 1 drop into both eyes 2 (two) times daily.     latanoprost  (XALATAN ) 0.005 % ophthalmic solution Place 1 drop into both eyes at bedtime.     NON FORMULARY Pt uses a c-pap nightly     omeprazole  (PRILOSEC) 40 MG capsule TAKE 1 CAPSULE(40 MG) BY MOUTH DAILY 90 capsule 0   erythromycin  ophthalmic ointment Place 1 Application into the right eye 4 (four) times daily. (Patient not taking: Reported on 11/17/2023)     moxifloxacin (VIGAMOX) 0.5 % ophthalmic solution Place 1 drop into both eyes in the morning, at noon, in the evening, and at bedtime. (Patient not taking: Reported on 11/17/2023)     prednisoLONE  acetate (PRED FORTE ) 1 % ophthalmic suspension Place 1 drop into the right eye every 2 (two) hours while awake. (Patient not taking: Reported on 11/17/2023)     traMADol  (ULTRAM ) 50 MG tablet Take 1-2 tablets (50-100 mg total) by mouth every 6 (six) hours as needed for moderate pain (pain score 4-6). (Patient not taking: Reported on 11/17/2023) 20 tablet 0   No facility-administered medications prior to visit.    Allergies  Allergen Reactions   Dayquil Multi-Symptom Cold-Flu [Pseudoephedrine-Apap-Dm] Hives    Review of Systems  Constitutional:  Negative for appetite change, chills, fatigue and fever.  HENT:  Negative for congestion, dental problem, ear pain and sore throat.   Eyes:  Positive for visual disturbance (R eye, s/p multiple surgeries recently). Negative for discharge and redness.  Respiratory:  Negative for  cough, chest tightness, shortness of breath and wheezing.   Cardiovascular:  Negative for chest pain, palpitations and leg swelling.  Gastrointestinal:  Negative for abdominal pain, blood in stool, diarrhea, nausea and vomiting.  Genitourinary:  Negative for difficulty urinating, dysuria, flank pain, frequency, hematuria and urgency.  Musculoskeletal:  Negative for arthralgias, back pain, joint swelling, myalgias and neck stiffness.  Skin:  Negative for pallor and rash.  Neurological:  Negative for dizziness, speech difficulty, weakness and headaches.  Hematological:  Negative for adenopathy. Does not bruise/bleed easily.  Psychiatric/Behavioral:  Negative for confusion and sleep disturbance. The patient is not nervous/anxious.     PE;    11/17/2023    7:58 AM 06/06/2023   12:41 PM 06/06/2023    9:46 AM  Vitals with BMI  Height 5' 11.75    Weight 258 lbs 10 oz    BMI 35.33    Systolic 103 110 885  Diastolic 71 85 81  Pulse 75 80 76    Gen: Alert, well appearing.  Patient is oriented to person, place, time, and situation. AFFECT: pleasant, lucid thought and speech. ENT: Ears: EACs clear, normal epithelium.  TMs with good light reflex and landmarks bilaterally.  Eyes: Right eye with diffuse injection and with slightly dimmer red reflex and just a slight amount of decrease in pupil size compared to the left.  No icteris, swelling, or exudate.  EOMI,  Nose: no drainage or turbinate edema/swelling.  No injection or focal lesion.  Mouth: lips without lesion/swelling.  Oral mucosa pink and moist.  Dentition intact and without obvious caries or gingival swelling.  Oropharynx without erythema, exudate, or swelling.  Neck: supple/nontender.  No LAD, mass, or TM.  Carotid pulses 2+ bilaterally, without bruits. CV: RRR, no m/r/g.   LUNGS: CTA bilat, nonlabored resps, good aeration in all lung fields. ABD: soft, NT, ND, BS normal.  No hepatospenomegaly or mass.  No bruits. EXT: no clubbing,  cyanosis, or edema.  Musculoskeletal: no  joint swelling, erythema, warmth, or tenderness.  ROM of all joints intact. Skin - no sores or suspicious lesions or rashes or color changes  Pertinent labs:  Lab Results  Component Value Date   TSH 2.08 11/09/2021   Lab Results  Component Value Date   WBC 9.1 06/06/2023   HGB 12.9 (L) 06/06/2023   HCT 37.9 (L) 06/06/2023   MCV 96.4 06/06/2023   PLT 119 (L) 06/06/2023   Lab Results  Component Value Date   CREATININE 1.16 06/06/2023   BUN 15 06/06/2023   NA 137 06/06/2023   K 4.1 06/06/2023   CL 109 06/06/2023   CO2 21 (L) 06/06/2023   Lab Results  Component Value Date   ALT 18 05/27/2023   AST 16 05/27/2023   ALKPHOS 88 05/27/2023   BILITOT 0.7 05/27/2023   Lab Results  Component Value Date   CHOL 234 (H) 11/09/2021   Lab Results  Component Value Date   HDL 34.20 (L) 11/09/2021   Lab Results  Component Value Date   LDLCALC 162 (H) 11/09/2021   Lab Results  Component Value Date   TRIG 189.0 (H) 11/09/2021   Lab Results  Component Value Date   CHOLHDL 7 11/09/2021   Lab Results  Component Value Date   PSA 7.35 12/12/2022   PSA 6.32 (H) 11/09/2021   PSA 4.29 (H) 07/31/2016   Lab Results  Component Value Date   HGBA1C 5.8 11/09/2021   ASSESSMENT AND PLAN:   #1 health maintenance exam: Reviewed age and gender appropriate health maintenance issues (prudent diet, regular exercise, health risks of tobacco and excessive alcohol, use of seatbelts, fire alarms in home, use of sunscreen).  Also reviewed age and gender appropriate health screening as well as vaccine recommendations. Vaccines:   Prevnar->declined.   Flu->given today. Labs: fasting HP ordered   Prostate ca screening: History of prostate cancer he is followed closely by urologist. Colon ca screening: hx adenomatous polyps-->recall 2030   #2 GERD, well-controlled on Prilosec 40 mg a day.  #3 IFG/prediabetes. Hemoglobin A1c today and fasting glucose  today.  An After Visit Summary was printed and given to the patient.  FOLLOW UP:  Return in about 1 year (around 11/16/2024) for annual CPE (fasting).  Signed:  Gerlene Hockey, MD           11/17/2023

## 2023-11-17 NOTE — Patient Instructions (Signed)
 Health Maintenance, Male  Adopting a healthy lifestyle and getting preventive care are important in promoting health and wellness. Ask your health care provider about:  The right schedule for you to have regular tests and exams.  Things you can do on your own to prevent diseases and keep yourself healthy.  What should I know about diet, weight, and exercise?  Eat a healthy diet    Eat a diet that includes plenty of vegetables, fruits, low-fat dairy products, and lean protein.  Do not eat a lot of foods that are high in solid fats, added sugars, or sodium.  Maintain a healthy weight  Body mass index (BMI) is a measurement that can be used to identify possible weight problems. It estimates body fat based on height and weight. Your health care provider can help determine your BMI and help you achieve or maintain a healthy weight.  Get regular exercise  Get regular exercise. This is one of the most important things you can do for your health. Most adults should:  Exercise for at least 150 minutes each week. The exercise should increase your heart rate and make you sweat (moderate-intensity exercise).  Do strengthening exercises at least twice a week. This is in addition to the moderate-intensity exercise.  Spend less time sitting. Even light physical activity can be beneficial.  Watch cholesterol and blood lipids  Have your blood tested for lipids and cholesterol at 53 years of age, then have this test every 5 years.  You may need to have your cholesterol levels checked more often if:  Your lipid or cholesterol levels are high.  You are older than 53 years of age.  You are at high risk for heart disease.  What should I know about cancer screening?  Many types of cancers can be detected early and may often be prevented. Depending on your health history and family history, you may need to have cancer screening at various ages. This may include screening for:  Colorectal cancer.  Prostate cancer.  Skin cancer.  Lung  cancer.  What should I know about heart disease, diabetes, and high blood pressure?  Blood pressure and heart disease  High blood pressure causes heart disease and increases the risk of stroke. This is more likely to develop in people who have high blood pressure readings or are overweight.  Talk with your health care provider about your target blood pressure readings.  Have your blood pressure checked:  Every 3-5 years if you are 24-52 years of age.  Every year if you are 3 years old or older.  If you are between the ages of 60 and 72 and are a current or former smoker, ask your health care provider if you should have a one-time screening for abdominal aortic aneurysm (AAA).  Diabetes  Have regular diabetes screenings. This checks your fasting blood sugar level. Have the screening done:  Once every three years after age 66 if you are at a normal weight and have a low risk for diabetes.  More often and at a younger age if you are overweight or have a high risk for diabetes.  What should I know about preventing infection?  Hepatitis B  If you have a higher risk for hepatitis B, you should be screened for this virus. Talk with your health care provider to find out if you are at risk for hepatitis B infection.  Hepatitis C  Blood testing is recommended for:  Everyone born from [birth year] through 1965.  Anyone  with known risk factors for hepatitis C.  Sexually transmitted infections (STIs)  You should be screened each year for STIs, including gonorrhea and chlamydia, if:  You are sexually active and are younger than 53 years of age.  You are older than 53 years of age and your health care provider tells you that you are at risk for this type of infection.  Your sexual activity has changed since you were last screened, and you are at increased risk for chlamydia or gonorrhea. Ask your health care provider if you are at risk.  Ask your health care provider about whether you are at high risk for HIV. Your health care provider  may recommend a prescription medicine to help prevent HIV infection. If you choose to take medicine to prevent HIV, you should first get tested for HIV. You should then be tested every 3 months for as long as you are taking the medicine.  Follow these instructions at home:  Alcohol use  Do not drink alcohol if your health care provider tells you not to drink.  If you drink alcohol:  Limit how much you have to 0-2 drinks a day.  Know how much alcohol is in your drink. In the [place] one drink equals one 12 oz bottle of beer (355 mL), one 5 oz glass of wine (148 mL), or one 1 oz glass of hard liquor (44 mL).  Lifestyle  Do not use any products that contain nicotine or tobacco. These products include cigarettes, chewing tobacco, and vaping devices, such as e-cigarettes. If you need help quitting, ask your health care provider.  Do not use street drugs.  Do not share needles.  Ask your health care provider for help if you need support or information about quitting drugs.  General instructions  Schedule regular health, dental, and eye exams.  Stay current with your vaccines.  Tell your health care provider if:  You often feel depressed.  You have ever been abused or do not feel safe at home.  Summary  Adopting a healthy lifestyle and getting preventive care are important in promoting health and wellness.  Follow your health care provider's instructions about healthy diet, exercising, and getting tested or screened for diseases.  Follow your health care provider's instructions on monitoring your cholesterol and blood pressure.  This information is not intended to replace advice given to you by your health care provider. Make sure you discuss any questions you have with your health care provider.  Document Revised: 07/03/2020 Document Reviewed: 07/03/2020  Elsevier Patient Education  2024 [employer].

## 2023-11-18 ENCOUNTER — Ambulatory Visit: Payer: Self-pay | Admitting: Family Medicine

## 2023-11-27 ENCOUNTER — Telehealth: Payer: Self-pay | Admitting: Internal Medicine

## 2023-11-27 NOTE — Telephone Encounter (Signed)
 Pt needs egd for hx of gerd please reschedule pt for previsit and egd in the [facility].

## 2023-11-27 NOTE — Telephone Encounter (Signed)
 Please reschedule pt for a previsit and an endoscopy with Dr. Albertus in the [facility].

## 2023-11-27 NOTE — Telephone Encounter (Signed)
 Receieved a cal from patient stating his is trying to get in fro EGD, he was approved but had schd conflict, patient is requesting nurse fu to discuss scheduling. Please review and advise  Thank you

## 2023-11-28 ENCOUNTER — Encounter: Payer: Self-pay | Admitting: Internal Medicine

## 2023-12-10 ENCOUNTER — Encounter: Payer: Self-pay | Admitting: Internal Medicine

## 2023-12-10 ENCOUNTER — Ambulatory Visit

## 2023-12-10 VITALS — Ht 72.0 in | Wt 255.0 lb

## 2023-12-10 DIAGNOSIS — K219 Gastro-esophageal reflux disease without esophagitis: Secondary | ICD-10-CM

## 2023-12-10 NOTE — Progress Notes (Signed)
 No egg or soy allergy  known to patient  No issues known to pt with past sedation with any surgeries or procedures Patient denies ever being told they had issues or difficulty with intubation  No FH of Malignant Hyperthermia Pt is not on diet pills Pt is not on  home 02  Pt is not on blood thinners  Pt denies issues with constipation  No A fib or A flutter Have any cardiac testing pending--No Pt can ambulate  Pt denies use of chewing tobacco Discussed diabetic I weight loss medication holds Discussed NSAID holds Checked BMI Pt instructed to use Singlecare.com or GoodRx for a price reduction on prep  Patient's chart reviewed by Norleen Schillings CNRA prior to previsit and patient appropriate for the [facility].  Pre visit completed and red dot placed by patient's name on their procedure day (on provider's schedule).  Unable to send instructions in [place], mailed to home address.

## 2023-12-24 ENCOUNTER — Ambulatory Visit: Admitting: Internal Medicine

## 2023-12-24 ENCOUNTER — Encounter: Payer: Self-pay | Admitting: Internal Medicine

## 2023-12-24 VITALS — BP 114/73 | HR 67 | Temp 98.9°F | Resp 12 | Ht 72.0 in | Wt 255.0 lb

## 2023-12-24 DIAGNOSIS — K219 Gastro-esophageal reflux disease without esophagitis: Secondary | ICD-10-CM

## 2023-12-24 DIAGNOSIS — K449 Diaphragmatic hernia without obstruction or gangrene: Secondary | ICD-10-CM | POA: Diagnosis not present

## 2023-12-24 DIAGNOSIS — K317 Polyp of stomach and duodenum: Secondary | ICD-10-CM | POA: Diagnosis not present

## 2023-12-24 DIAGNOSIS — K2270 Barrett's esophagus without dysplasia: Secondary | ICD-10-CM

## 2023-12-24 DIAGNOSIS — K22719 Barrett's esophagus with dysplasia, unspecified: Secondary | ICD-10-CM

## 2023-12-24 MED ORDER — SODIUM CHLORIDE 0.9 % IV SOLN: 500.0000 mL | INTRAVENOUS | Status: DC

## 2023-12-24 MED ORDER — OMEPRAZOLE 40 MG PO CPDR: DELAYED_RELEASE_CAPSULE | ORAL | 3 refills | Status: AC

## 2023-12-24 NOTE — Progress Notes (Signed)
 Called to room to assist during endoscopic procedure.  Patient ID and intended procedure confirmed with present staff. Received instructions for my participation in the procedure from the performing physician.

## 2023-12-24 NOTE — Patient Instructions (Addendum)
 Resume previous diet. Continue present medications. Awaiting pathology results. Handouts provided on hiatal hernia and polyps.  YOU HAD AN ENDOSCOPIC PROCEDURE TODAY AT THE [facility]:   Refer to the procedure report that was given to you for any specific questions about what was found during the examination.  If the procedure report does not answer your questions, please call your gastroenterologist to clarify.  If you requested that your care partner not be given the details of your procedure findings, then the procedure report has been included in a sealed envelope for you to review at your convenience later.  YOU SHOULD EXPECT: Some feelings of bloating in the abdomen. Passage of more gas than usual.  Walking can help get rid of the air that was put into your GI tract during the procedure and reduce the bloating. If you had a lower endoscopy (such as a colonoscopy or flexible sigmoidoscopy) you may notice spotting of blood in your stool or on the toilet paper. If you underwent a bowel prep for your procedure, you may not have a normal bowel movement for a few days.  Please Note:  You might notice some irritation and congestion in your nose or some drainage.  This is from the oxygen used during your procedure.  There is no need for concern and it should clear up in a day or so.  SYMPTOMS TO REPORT IMMEDIATELY:  Following upper endoscopy (EGD)  Vomiting of blood or coffee ground material  New chest pain or pain under the shoulder blades  Painful or persistently difficult swallowing  New shortness of breath  Fever of 100F or higher  Black, tarry-looking stools   For urgent or emergent issues, a gastroenterologist can be reached at any hour by calling [phone]. Do not use MyChart messaging for urgent concerns.    DIET:  We do recommend a small meal at first, but then you may proceed to your regular diet.  Drink plenty of fluids but you should avoid alcoholic beverages  for 24 hours.  ACTIVITY:  You should plan to take it easy for the rest of today and you should NOT DRIVE or use heavy machinery until tomorrow (because of the sedation medicines used during the test).    FOLLOW UP: Our staff will call the number listed on your records the next business day following your procedure.  We will call around 7:15- 8:00 am to check on you and address any questions or concerns that you may have regarding the information given to you following your procedure. If we do not reach you, we will leave a message.     If any biopsies were taken you will be contacted by phone or by letter within the next 1-3 weeks.  Please call us  at [phone] if you have not heard about the biopsies in 3 weeks.    SIGNATURES/CONFIDENTIALITY: You and/or your care partner have signed paperwork which will be entered into your electronic medical record.  These signatures attest to the fact that that the information above on your After Visit Summary has been reviewed and is understood.  Full responsibility of the confidentiality of this discharge information lies with you and/or your care-partner.

## 2023-12-24 NOTE — Progress Notes (Signed)
 GASTROENTEROLOGY PROCEDURE H&P NOTE   Primary Care Physician: Candise Aleene DEL, MD    Reason for Procedure:  GERD, chronic  Plan:    EGD  Patient is appropriate for endoscopic procedure(s) in the ambulatory ([facility]) setting.  The nature of the procedure, as well as the risks, benefits, and alternatives were carefully and thoroughly reviewed with the patient. Ample time for discussion and questions allowed. The patient understood, was satisfied, and agreed to proceed.     HPI: Roy [name] is a 53 y.o. male who presents for EGD.  Medical history as below.  No recent chest pain or shortness of breath.  No abdominal pain today.  Past Medical History:  Diagnosis Date   Allergy  1998   Dayquil - hives   Chronic rhinitis    Deviated nasal septum, hypertrophy of nasal turbinates: Dr. Verlan turb reduct and septoplasty.   Colitis    Diverticulosis 2009   Colonoscopy confirmed approx 2009 (diverticulitis x 2 episodes).   GERD (gastroesophageal reflux disease)    Glaucoma    History of adenomatous polyp of colon 12/11/2015;11/2018   Recall 11/2023.   Hypercholesterolemia 2018,2019,2020   Mild; Framingham CV risk<<7.5%.  TLC.  07/2019 I recommended he see advanced lipid clinic provider.   Irritable bowel syndrome with diarrhea    Left lumbar radiculopathy 2024   Dr. Claudene and Dr. Cala   Obesity, Class I, BMI 30-34.9    OSA on CPAP    Sleep study in remote past was normal per pt report.  OSA eval w/Ava 2018.  AHI 19/hr, desat to 81%.  Titration study 01/21/2017, optimal CPAP 6 cm H20.    Prostate cancer (HCC)    elev PSA since 2018.  Prostate bx + Jan 2025. Prostatectomy 05/2023.   Sleep apnea    Spondylolisthesis at L5-S1 level    Thrombocytopenia 2014   Mild; stable 2014-2016.  Abd u/s 02/2014 NORMAL.    Past Surgical History:  Procedure Laterality Date   ALIF--percutaneous stereotactic pedicle screw placement w/allograft (Dr. Dawn)  08/2020    BICEPS TENDON REPAIR     COLONOSCOPY W/ POLYPECTOMY  12/11/2015; 12/21/18   2017 Tubular adenoma x 1, +Diverticulosis and int hem.  Rpt 11/2018 with 1 sessile serrated poly, diverticulosis: recall 5 yrs.   EYE SURGERY     lasik   EYE SURGERY Right    Feb, March, July of 2025 - Retinal   KNEE ARTHROSCOPY Bilateral    Bilat; 2003, 2004, 2010   LUMBAR DISC SURGERY  01/2018   NASAL SEPTOPLASTY W/ TURBINOPLASTY Bilateral 04/14/2015   Procedure: NASAL SEPTOPLASTY WITH BILATERAL INFERIOR  TURBINATE REDUCTION;  Surgeon: Alm Bouche, MD;  Location: [facility] OR;  Service: ENT;  Laterality: Bilateral;   PROSTATE BIOPSY  03/03/2023   +cancer   ROBOT ASSISTED LAPAROSCOPIC RADICAL PROSTATECTOMY N/A 06/05/2023   Path->cancer is organ-confined. Procedure: PROSTATECTOMY, RADICAL, ROBOT-ASSISTED, LAPAROSCOPIC;  Surgeon: Cam Morene ORN, MD;  Location: [facility] ORS;  Service: Urology;  Laterality: N/A;  270 MINUTE CASE   SHOULDER ARTHROSCOPY Bilateral    Bilat; 1993, 2000, 2007   SPINE SURGERY  08/15/2020   WISDOM TOOTH EXTRACTION     young adult   WRIST FRACTURE SURGERY Left 2010   Hockey injury, plate inserted    Prior to Admission medications   Medication Sig Start Date End Date Taking? Authorizing Provider  brinzolamide  (AZOPT ) 1 % ophthalmic suspension Place 1 drop into both eyes 2 (two) times daily. 03/08/23  Yes [provider]  latanoprost  (XALATAN )  0.005 % ophthalmic solution Place 1 drop into both eyes at bedtime.   Yes [provider]  omeprazole  (PRILOSEC) 40 MG capsule Take 1 capsule (40 mg total) by mouth daily. 11/17/23  Yes [name], Aleene DEL, MD  tadalafil (CIALIS) 5 MG tablet Take 5 mg by mouth daily. 11/25/23  Yes [provider]    Current Outpatient Medications  Medication Sig Dispense Refill   brinzolamide  (AZOPT ) 1 % ophthalmic suspension Place 1 drop into both eyes 2 (two) times daily.     latanoprost  (XALATAN ) 0.005 % ophthalmic solution Place 1 drop into  both eyes at bedtime.     omeprazole  (PRILOSEC) 40 MG capsule Take 1 capsule (40 mg total) by mouth daily. 90 capsule 3   tadalafil (CIALIS) 5 MG tablet Take 5 mg by mouth daily.     Current Facility-Administered Medications  Medication Dose Route Frequency Provider Last Rate Last Admin   0.9 %  sodium chloride  infusion  500 mL Intravenous Continuous Mercedes Valeriano, Gordy HERO, MD        Allergies as of 12/24/2023 - Review Complete 12/24/2023  Allergen Reaction Noted   Dayquil multi-symptom cold-flu [pseudoephedrine-apap-dm] Hives 11/26/2010    Family History  Problem Relation Age of Onset   Diverticulitis Mother    Colon polyps Father    Stomach cancer Paternal Aunt    Colon cancer Maternal Grandmother    Cancer Maternal Grandmother    Pancreatic cancer Paternal Grandfather    Lung cancer Paternal Grandfather         (smoker)   Cancer Paternal Grandfather    Cancer Maternal Grandfather    Esophageal cancer Neg Hx    Rectal cancer Neg Hx     Social History   Socioeconomic History   Marital status: Married    Spouse name: Not on file   Number of children: 4   Years of education: Not on file   Highest education level: Bachelor's degree (e.g., BA, AB, BS)  Occupational History   Occupation: art gallery manager  Tobacco Use   Smoking status: Never   Smokeless tobacco: Never  Vaping Use   Vaping status: Never Used  Substance and Sexual Activity   Alcohol use: Yes    Alcohol/week: 1.0 standard drink of alcohol    Types: 1 Cans of beer per week    Comment: beer occasional   Drug use: No   Sexual activity: Not on file  Other Topics Concern   Not on file  Social History Narrative   Married, 4 children.   No sibs.   Occupation: Doctor, [employer].   Grad from [employer] in [place], [place].   Originally from [place] , lived in [place] for a while, then relocated to [facility] [employer]) about 2010.   Exercises irregularly, no particular dietary focus.   No T/A/Ds.    Social Drivers of Corporate Investment Banker Strain: Low Risk  (11/14/2023)   Overall Financial Resource Strain (CARDIA)    Difficulty of Paying Living Expenses: Not hard at all  Food Insecurity: No Food Insecurity (11/14/2023)   Hunger Vital Sign    Worried About Running Out of Food in the Last Year: Never true    Ran Out of Food in the Last Year: Never true  Transportation Needs: No Transportation Needs (11/14/2023)   PRAPARE - Administrator, Civil Service (Medical): No    Lack of Transportation (Non-Medical): No  Physical Activity: Insufficiently Active (11/14/2023)   Exercise Vital Sign  Days of Exercise per Week: 3 days    Minutes of Exercise per Session: 20 min  Stress: No Stress Concern Present (11/14/2023)   [employer] of Occupational Health - Occupational Stress Questionnaire    Feeling of Stress: Not at all  Social Connections: Socially Integrated (11/14/2023)   Social Connection and Isolation Panel    Frequency of Communication with Friends and Family: Three times a week    Frequency of Social Gatherings with Friends and Family: Once a week    Attends Religious Services: More than 4 times per year    Active Member of [employer] or Organizations: Yes    Attends Engineer, Structural: More than 4 times per year    Marital Status: Married  Catering Manager Violence: Not At Risk (09/13/2023)   Received from [facility]   HITS    Over the last 12 months how often did your partner physically hurt you?: Never    Over the last 12 months how often did your partner insult you or talk down to you?: Never    Over the last 12 months how often did your partner threaten you with physical harm?: Never    Over the last 12 months how often did your partner scream or curse at you?: Never    Physical Exam: Vital signs in last 24 hours: @BP  131/86   Pulse 75   Temp 98.9 F (37.2 C)   Ht 6' (1.829 m)   Wt 255 lb (115.7 kg)   SpO2 97%   BMI 34.58 kg/m  GEN:  NAD EYE: Sclerae anicteric ENT: MMM CV: Non-tachycardic Pulm: CTA b/l GI: Soft, NT/ND NEURO:  Alert & Oriented x 3   Gordy Starch, MD [facility]  12/24/2023 10:07 AM

## 2023-12-24 NOTE — Progress Notes (Signed)
 Report to PACU, RN, vss, BBS= Clear.

## 2023-12-24 NOTE — Op Note (Signed)
 [facility] Patient Name: Roy [name] Procedure Date: 12/24/2023 10:09 AM MRN: [id number] Endoscopist: Gordy CHRISTELLA Starch , MD, [id number] Age: 53 Referring MD:  Date of Birth: 1971/01/01 Gender: Male Account #: [id number] Procedure:                Upper GI endoscopy Indications:              Gastro-esophageal reflux disease, current therapy                            is omeprazole  40 mg daily Medicines:                Monitored Anesthesia Care Procedure:                Pre-Anesthesia Assessment:                           - Prior to the procedure, a History and Physical                            was performed, and patient medications and                            allergies were reviewed. The patient's tolerance of                            previous anesthesia was also reviewed. The risks                            and benefits of the procedure and the sedation                            options and risks were discussed with the patient.                            All questions were answered, and informed consent                            was obtained. Prior Anticoagulants: The patient has                            taken no anticoagulant or antiplatelet agents. ASA                            Grade Assessment: II - A patient with mild systemic                            disease. After reviewing the risks and benefits,                            the patient was deemed in satisfactory condition to                            undergo the procedure.  After obtaining informed consent, the endoscope was                            passed under direct vision. Throughout the                            procedure, the patient's blood pressure, pulse, and                            oxygen saturations were monitored continuously. The                            GIF [id number] #[id number] was introduced through the                            mouth, and advanced to  the second part of duodenum.                            The upper GI endoscopy was accomplished without                            difficulty. The patient tolerated the procedure                            well. Scope In: Scope Out: Findings:                 The esophagus and gastroesophageal junction were                            examined with white light and narrow band imaging                            (NBI) from a forward view and retroflexed position.                            There were esophageal mucosal changes suggestive of                            long-segment Barrett's esophagus. These changes                            involved the mucosa at the upper extent of the                            gastric folds (39 cm from the incisors) extending                            to the Z-line (35 cm from the incisors).                            Circumferential salmon-colored mucosa was present  from 35 to 39 cm. The maximum longitudinal extent                            of these esophageal mucosal changes was 4 cm in                            length. Mucosa was biopsied with a cold forceps for                            histology in a targeted manner and in 4 quadrants                            at intervals of 2 cm. A total of 3 specimen bottles                            were sent to pathology.                           A 4 cm hiatal hernia was present. Top of gastric                            folds at 39 cm, diaphragmatic hiatus at 43 cm.                           The gastroesophageal flap valve was visualized                            endoscopically and classified as Hill Grade IV (no                            fold, wide open lumen, hiatal hernia present).                           Multiple small sessile polyps were found in the                            gastric fundus and in the gastric body. These are                            benign appearing  and most consistent with fundic                            gland polyps.                           The exam of the stomach was otherwise normal.                           The examined duodenum was normal. Complications:            No immediate complications. Estimated Blood Loss:     Estimated blood loss was minimal. Impression:               -  Esophageal mucosal changes suggestive of                            long-segment Barrett's esophagus. Biopsied.                           - 4 cm hiatal hernia.                           - Multiple gastric polyps. Benign in appearance and                            most consistent with fundic gland polyps.                           - Normal examined duodenum. Recommendation:           - Patient has a contact number available for                            emergencies. The signs and symptoms of potential                            delayed complications were discussed with the                            patient. Return to normal activities tomorrow.                            Written discharge instructions were provided to the                            patient.                           - Resume previous diet.                           - Continue present medications.                           - Await pathology results. Gordy CHRISTELLA Starch, MD 12/24/2023 10:29:22 AM This report has been signed electronically.

## 2023-12-24 NOTE — Progress Notes (Signed)
 I have reviewed the patient's medical history in detail and updated the computerized patient record.

## 2023-12-25 ENCOUNTER — Telehealth: Payer: Self-pay

## 2023-12-25 NOTE — Telephone Encounter (Signed)
Left HIPAA compliant voicemail. 

## 2023-12-26 ENCOUNTER — Ambulatory Visit: Payer: Self-pay | Admitting: Internal Medicine

## 2023-12-26 LAB — SURGICAL PATHOLOGY

## 2024-02-17 ENCOUNTER — Ambulatory Visit: Admitting: Family Medicine

## 2024-02-17 ENCOUNTER — Ambulatory Visit: Payer: Self-pay | Admitting: Family Medicine

## 2024-02-17 ENCOUNTER — Encounter: Payer: Self-pay | Admitting: Family Medicine

## 2024-02-17 VITALS — BP 114/79 | HR 62 | Temp 97.0°F | Ht 72.0 in | Wt 262.4 lb

## 2024-02-17 DIAGNOSIS — E782 Mixed hyperlipidemia: Secondary | ICD-10-CM

## 2024-02-17 DIAGNOSIS — E7800 Pure hypercholesterolemia, unspecified: Secondary | ICD-10-CM | POA: Diagnosis not present

## 2024-02-17 DIAGNOSIS — R7303 Prediabetes: Secondary | ICD-10-CM | POA: Diagnosis not present

## 2024-02-17 LAB — LIPID PANEL
Cholesterol: 258 mg/dL — ABNORMAL HIGH (ref 28–200)
HDL: 31.6 mg/dL — ABNORMAL LOW
LDL Cholesterol: 191 mg/dL — ABNORMAL HIGH (ref 10–99)
NonHDL: 226.49
Total CHOL/HDL Ratio: 8
Triglycerides: 178 mg/dL — ABNORMAL HIGH (ref 10.0–149.0)
VLDL: 35.6 mg/dL (ref 0.0–40.0)

## 2024-02-17 LAB — COMPREHENSIVE METABOLIC PANEL WITH GFR
ALT: 16 U/L (ref 3–53)
AST: 16 U/L (ref 5–37)
Albumin: 4.3 g/dL (ref 3.5–5.2)
Alkaline Phosphatase: 99 U/L (ref 39–117)
BUN: 15 mg/dL (ref 6–23)
CO2: 30 meq/L (ref 19–32)
Calcium: 9.2 mg/dL (ref 8.4–10.5)
Chloride: 105 meq/L (ref 96–112)
Creatinine, Ser: 1.16 mg/dL (ref 0.40–1.50)
GFR: 72.1 mL/min
Glucose, Bld: 108 mg/dL — ABNORMAL HIGH (ref 70–99)
Potassium: 4.1 meq/L (ref 3.5–5.1)
Sodium: 140 meq/L (ref 135–145)
Total Bilirubin: 0.6 mg/dL (ref 0.2–1.2)
Total Protein: 7.1 g/dL (ref 6.0–8.3)

## 2024-02-17 LAB — HEMOGLOBIN A1C: Hgb A1c MFr Bld: 5.8 % (ref 4.6–6.5)

## 2024-02-17 NOTE — Progress Notes (Signed)
 OFFICE VISIT  02/17/2024  CC:  Chief Complaint  Patient presents with   Medical Management of Chronic Issues    3 month f/u; pt is fasting    Patient is a 53 y.o. male who presents for discussion of abnormal lab results from November 17, 2023.  HPI: At his CPE visit on 11/17/2023 his A1c was 6.5%, total cholesterol 251, triglycerides 258, HDL 33, LDL 166. Therapeutic lifestyle changes   Has not gotten to make many changes from a therapeutic lifestyle point due to some pressing medical issues this year.  He has no acute concerns today.  ROS as above, plus--> no fevers, no CP, no SOB, no wheezing, no cough, no dizziness, no HAs, no rashes, no melena/hematochezia.  No polyuria or polydipsia.  No myalgias or arthralgias.  No focal weakness, paresthesias, or tremors.  No acute vision or hearing abnormalities.  No dysuria or unusual/new urinary urgency or frequency.  No recent changes in lower legs. No n/v/d or abd pain.  No palpitations.     Past Medical History:  Diagnosis Date   Allergy  1998   Dayquil - hives   Chronic rhinitis    Deviated nasal septum, hypertrophy of nasal turbinates: Dr. Verlan turb reduct and septoplasty.   Colitis    Diverticulosis 2009   Colonoscopy confirmed approx 2009 (diverticulitis x 2 episodes).   GERD (gastroesophageal reflux disease)    Glaucoma    History of adenomatous polyp of colon 12/11/2015;11/2018   Recall 11/2023.   Hypercholesterolemia 2018,2019,2020   Mild; Framingham CV risk<<7.5%.  TLC.  07/2019 I recommended he see advanced lipid clinic provider.   Irritable bowel syndrome with diarrhea    Left lumbar radiculopathy 2024   Dr. Claudene and Dr. Cala   Obesity, Class I, BMI 30-34.9    OSA on CPAP    Sleep study in remote past was normal per pt report.  OSA eval w/[place] 2018.  AHI 19/hr, desat to 81%.  Titration study 01/21/2017, optimal CPAP 6 cm H20.    Prostate cancer (HCC)    elev PSA since 2018.  Prostate bx + Jan  2025. Prostatectomy 05/2023.   Sleep apnea    Spondylolisthesis at L5-S1 level    Thrombocytopenia 2014   Mild; stable 2014-2016.  Abd u/s 02/2014 NORMAL.    Past Surgical History:  Procedure Laterality Date   ALIF--percutaneous stereotactic pedicle screw placement w/allograft (Dr. Dawn)  08/2020   BICEPS TENDON REPAIR     COLONOSCOPY W/ POLYPECTOMY  12/11/2015; 12/21/18   2017 Tubular adenoma x 1, +Diverticulosis and int hem.  Rpt 11/2018 with 1 sessile serrated poly, diverticulosis.  04/2023 polyp x 1.   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 Procedure: PROSTATECTOMY, RADICAL, ROBOT-ASSISTED, LAPAROSCOPIC;  Surgeon: Cam Morene ORN, MD;  Location: [facility];  Service: Urology;  Laterality: N/A;  270 MINUTE CASE   SHOULDER ARTHROSCOPY Bilateral    [place]; 1993, 2000,  2007   SPINE SURGERY  08/15/2020   WISDOM TOOTH EXTRACTION     young adult   WRIST FRACTURE SURGERY Left 2010   Hockey injury, plate inserted    Outpatient Medications Prior to Visit  Medication Sig Dispense Refill   brinzolamide  (AZOPT ) 1 % ophthalmic suspension Place 1 drop into both eyes 2 (two) times daily.     latanoprost  (XALATAN ) 0.005 % ophthalmic solution Place 1 drop into both eyes at bedtime.     omeprazole  (PRILOSEC) 40 MG capsule Take 1 capsule (40 mg total) by mouth daily. 90 capsule 3   omeprazole  (PRILOSEC) 40 MG capsule Omeprazole  40 mg po daily 90 days  for 1 year. 90 capsule 3   tadalafil (CIALIS) 5 MG tablet Take 5 mg by mouth daily.     timolol (TIMOPTIC) 0.5 % ophthalmic solution Place 1 drop into both eyes 2 (two) times daily.     No facility-administered medications prior to visit.    Allergies[1]  Review of Systems  As per HPI  PE:    02/17/2024    7:54 AM 12/24/2023   10:46 AM 12/24/2023   10:36 AM  Vitals with BMI  Height 6' 0    Weight 262 lbs 6 oz    BMI 35.58    Systolic 114 114 897  Diastolic 79 73 62  Pulse 62 67 69     Physical Exam  Gen: Alert, well appearing.  Patient is oriented to person, place, time, and situation. AFFECT: pleasant, lucid thought and speech. No further exam today  LABS:  Last metabolic panel Lab Results  Component Value Date   GLUCOSE 108 (H) 11/17/2023   NA 141 11/17/2023   K 4.5 11/17/2023   CL 106 11/17/2023   CO2 29 11/17/2023   BUN 18 11/17/2023   CREATININE 1.26 11/17/2023   GFR 65.40 11/17/2023   CALCIUM  9.5 11/17/2023   PROT 7.1 11/17/2023   ALBUMIN  4.3 11/17/2023   BILITOT 0.7 11/17/2023   ALKPHOS 118 (H) 11/17/2023   AST 14 11/17/2023   ALT 11 11/17/2023   ANIONGAP 7 06/06/2023   Lab Results  Component Value Date   HGBA1C 6.5 11/17/2023   Lab Results  Component Value Date   WBC 4.4 11/17/2023   HGB 14.7 11/17/2023   HCT 43.6 11/17/2023   MCV 92.2 11/17/2023   PLT 149.0 (L) 11/17/2023   Lab Results  Component Value Date   CHOL 251 (H) 11/17/2023   HDL 33.60 (L) 11/17/2023   LDLCALC 166 (H) 11/17/2023   LDLDIRECT 133.0 10/31/2020   TRIG 258.0 (H) 11/17/2023   CHOLHDL 7 11/17/2023   Lab Results  Component Value Date   PSA 7.35 12/12/2022   PSA 6.32 (H) 11/09/2021   PSA 4.29 (H) 07/31/2016   Lab Results  Component Value Date   TSH 2.08 11/09/2021   IMPRESSION AND PLAN:  Borderline diabetes and mild to moderate hypercholesterolemia. His 10-year Framingham cardiovascular risk is 7.5% (intermediate). We are repeating labs today and  still want to emphasize therapeutic lifestyle change as his main step in treatment.  However we did discuss statins and the risks and benefits in his instance. We discussed possible further restratification with coronary calcium  score. We touched briefly on diabetic diet and the importance of statins in patients with diabetes regardless of the cholesterol levels. If hemoglobin A1c goes up then we may get him in with a nutritionist.  An After Visit Summary was printed and given to the patient.  FOLLOW  UP: Return in about 3 months (around 05/17/2024) for routine chronic illness f/u.  Signed:  Gerlene Hockey, MD           02/17/2024      [1]  Allergies Allergen Reactions   Dayquil Multi-Symptom Cold-Flu [Pseudoephedrine-Apap-Dm] Hives

## 2024-03-30 ENCOUNTER — Encounter: Payer: Self-pay | Admitting: Family Medicine

## 2024-03-30 DIAGNOSIS — Z9189 Other specified personal risk factors, not elsewhere classified: Secondary | ICD-10-CM

## 2024-03-30 DIAGNOSIS — E782 Mixed hyperlipidemia: Secondary | ICD-10-CM

## 2024-03-30 NOTE — Telephone Encounter (Signed)
 Coronary calcium  imaging test ordered for [facility]. Pls give him their number so he can call and schedule. thx

## 2024-04-07 ENCOUNTER — Other Ambulatory Visit (HOSPITAL_BASED_OUTPATIENT_CLINIC_OR_DEPARTMENT_OTHER)

## 2024-05-25 ENCOUNTER — Ambulatory Visit: Admitting: Family Medicine

## 2024-11-17 ENCOUNTER — Encounter: Admitting: Family Medicine
